# Patient Record
Sex: Male | Born: 1935 | Race: White | Hispanic: No | Smoking: Former smoker
Health system: Southern US, Community
[De-identification: ages and names within clinical notes are randomized; demographics above are authoritative.]

## PROBLEM LIST (undated history)

## (undated) DIAGNOSIS — Z7901 Long term (current) use of anticoagulants: Secondary | ICD-10-CM

## (undated) DIAGNOSIS — R0600 Dyspnea, unspecified: Secondary | ICD-10-CM

## (undated) DIAGNOSIS — I6529 Occlusion and stenosis of unspecified carotid artery: Secondary | ICD-10-CM

## (undated) DIAGNOSIS — G709 Myoneural disorder, unspecified: Secondary | ICD-10-CM

## (undated) DIAGNOSIS — N400 Enlarged prostate without lower urinary tract symptoms: Secondary | ICD-10-CM

## (undated) DIAGNOSIS — I639 Cerebral infarction, unspecified: Secondary | ICD-10-CM

## (undated) DIAGNOSIS — I480 Paroxysmal atrial fibrillation: Secondary | ICD-10-CM

## (undated) DIAGNOSIS — C4490 Unspecified malignant neoplasm of skin, unspecified: Secondary | ICD-10-CM

## (undated) DIAGNOSIS — T8859XA Other complications of anesthesia, initial encounter: Secondary | ICD-10-CM

## (undated) DIAGNOSIS — R011 Cardiac murmur, unspecified: Secondary | ICD-10-CM

## (undated) DIAGNOSIS — I10 Essential (primary) hypertension: Secondary | ICD-10-CM

## (undated) DIAGNOSIS — I2510 Atherosclerotic heart disease of native coronary artery without angina pectoris: Secondary | ICD-10-CM

## (undated) DIAGNOSIS — M1990 Unspecified osteoarthritis, unspecified site: Secondary | ICD-10-CM

## (undated) DIAGNOSIS — I4891 Unspecified atrial fibrillation: Secondary | ICD-10-CM

## (undated) DIAGNOSIS — I7121 Aneurysm of the ascending aorta, without rupture: Secondary | ICD-10-CM

## (undated) DIAGNOSIS — E7800 Pure hypercholesterolemia, unspecified: Secondary | ICD-10-CM

## (undated) DIAGNOSIS — T4145XA Adverse effect of unspecified anesthetic, initial encounter: Secondary | ICD-10-CM

## (undated) DIAGNOSIS — I499 Cardiac arrhythmia, unspecified: Secondary | ICD-10-CM

## (undated) DIAGNOSIS — G459 Transient cerebral ischemic attack, unspecified: Secondary | ICD-10-CM

## (undated) DIAGNOSIS — N200 Calculus of kidney: Secondary | ICD-10-CM

## (undated) DIAGNOSIS — I350 Nonrheumatic aortic (valve) stenosis: Secondary | ICD-10-CM

## (undated) DIAGNOSIS — I498 Other specified cardiac arrhythmias: Secondary | ICD-10-CM

## (undated) DIAGNOSIS — I712 Thoracic aortic aneurysm, without rupture: Secondary | ICD-10-CM

## (undated) DIAGNOSIS — C61 Malignant neoplasm of prostate: Secondary | ICD-10-CM

## (undated) HISTORY — DX: Cerebral infarction, unspecified: I63.9

## (undated) HISTORY — DX: Essential (primary) hypertension: I10

## (undated) HISTORY — DX: Other specified cardiac arrhythmias: I49.8

## (undated) HISTORY — DX: Pure hypercholesterolemia, unspecified: E78.00

## (undated) HISTORY — DX: Long term (current) use of anticoagulants: Z79.01

## (undated) HISTORY — DX: Occlusion and stenosis of unspecified carotid artery: I65.29

## (undated) HISTORY — DX: Nonrheumatic aortic (valve) stenosis: I35.0

## (undated) HISTORY — PX: COLONOSCOPY: SHX174

## (undated) HISTORY — DX: Calculus of kidney: N20.0

## (undated) HISTORY — DX: Malignant neoplasm of prostate: C61

## (undated) HISTORY — DX: Unspecified malignant neoplasm of skin, unspecified: C44.90

## (undated) HISTORY — DX: Benign prostatic hyperplasia without lower urinary tract symptoms: N40.0

## (undated) HISTORY — PX: OTHER SURGICAL HISTORY: SHX169

## (undated) HISTORY — DX: Unspecified atrial fibrillation: I48.91

## (undated) HISTORY — PX: JOINT REPLACEMENT: SHX530

## (undated) HISTORY — DX: Paroxysmal atrial fibrillation: I48.0

## (undated) HISTORY — PX: EYE SURGERY: SHX253

## (undated) HISTORY — PX: TOTAL HIP ARTHROPLASTY: SHX124

## (undated) HISTORY — DX: Transient cerebral ischemic attack, unspecified: G45.9

## (undated) HISTORY — DX: Atherosclerotic heart disease of native coronary artery without angina pectoris: I25.10

---

## 1994-08-02 HISTORY — PX: HERNIA REPAIR: SHX51

## 1997-08-02 HISTORY — PX: APPENDECTOMY: SHX54

## 1999-03-06 ENCOUNTER — Encounter: Payer: Self-pay | Admitting: Cardiology

## 2005-08-02 HISTORY — PX: CORONARY ARTERY BYPASS GRAFT: SHX141

## 2006-06-29 ENCOUNTER — Ambulatory Visit: Payer: Self-pay | Admitting: Cardiology

## 2006-07-04 ENCOUNTER — Inpatient Hospital Stay (HOSPITAL_BASED_OUTPATIENT_CLINIC_OR_DEPARTMENT_OTHER): Admission: RE | Admit: 2006-07-04 | Discharge: 2006-07-04 | Payer: Self-pay | Admitting: Cardiology

## 2006-07-04 ENCOUNTER — Ambulatory Visit (HOSPITAL_COMMUNITY): Admission: RE | Admit: 2006-07-04 | Discharge: 2006-07-04 | Payer: Self-pay | Admitting: Cardiovascular Disease

## 2006-07-04 ENCOUNTER — Ambulatory Visit: Payer: Self-pay | Admitting: Cardiovascular Disease

## 2006-07-13 ENCOUNTER — Ambulatory Visit: Payer: Self-pay | Admitting: Cardiology

## 2006-07-13 ENCOUNTER — Inpatient Hospital Stay (HOSPITAL_COMMUNITY)
Admission: RE | Admit: 2006-07-13 | Discharge: 2006-07-18 | Payer: Self-pay | Admitting: Thoracic Surgery (Cardiothoracic Vascular Surgery)

## 2006-08-12 ENCOUNTER — Ambulatory Visit (HOSPITAL_COMMUNITY): Admission: RE | Admit: 2006-08-12 | Discharge: 2006-08-12 | Payer: Self-pay | Admitting: Cardiology

## 2006-08-12 ENCOUNTER — Ambulatory Visit: Payer: Self-pay | Admitting: Cardiology

## 2007-03-10 ENCOUNTER — Ambulatory Visit: Payer: Self-pay | Admitting: Cardiology

## 2008-04-18 ENCOUNTER — Ambulatory Visit: Payer: Self-pay | Admitting: Cardiology

## 2008-04-18 LAB — CONVERTED CEMR LAB
ALT: 26 units/L (ref 0–53)
Albumin: 4.3 g/dL (ref 3.5–5.2)
Alkaline Phosphatase: 58 units/L (ref 39–117)
Cholesterol: 258 mg/dL (ref 0–200)
GFR calc Af Amer: 70 mL/min
HDL: 26.2 mg/dL — ABNORMAL LOW (ref >39.0)
Potassium: 4.8 meq/L (ref 3.5–5.1)
Sodium: 140 meq/L (ref 135–145)
Total Bilirubin: 1.3 mg/dL — ABNORMAL HIGH (ref 0.3–1.2)
Total CHOL/HDL Ratio: 9.8
Triglycerides: 191 mg/dL — ABNORMAL HIGH (ref 0–149)

## 2008-06-11 ENCOUNTER — Ambulatory Visit: Payer: Self-pay | Admitting: Cardiology

## 2008-06-11 LAB — CONVERTED CEMR LAB
ALT: 21 units/L (ref 0–53)
Albumin: 3.6 g/dL (ref 3.5–5.2)
HDL: 31 mg/dL — ABNORMAL LOW (ref >39.0)
LDL Cholesterol: 134 mg/dL — ABNORMAL HIGH (ref 0–99)
Total Bilirubin: 1.1 mg/dL (ref 0.3–1.2)
Total Protein: 6.7 g/dL (ref 6.0–8.3)
VLDL: 21 mg/dL (ref 0–40)

## 2008-08-02 DIAGNOSIS — Z87442 Personal history of urinary calculi: Secondary | ICD-10-CM

## 2008-08-02 HISTORY — DX: Personal history of urinary calculi: Z87.442

## 2009-02-17 ENCOUNTER — Encounter (INDEPENDENT_AMBULATORY_CARE_PROVIDER_SITE_OTHER): Payer: Self-pay | Admitting: *Deleted

## 2009-05-26 DIAGNOSIS — E7800 Pure hypercholesterolemia, unspecified: Secondary | ICD-10-CM | POA: Insufficient documentation

## 2009-05-26 DIAGNOSIS — I10 Essential (primary) hypertension: Secondary | ICD-10-CM | POA: Insufficient documentation

## 2009-05-26 DIAGNOSIS — E785 Hyperlipidemia, unspecified: Secondary | ICD-10-CM | POA: Insufficient documentation

## 2009-05-26 DIAGNOSIS — I2510 Atherosclerotic heart disease of native coronary artery without angina pectoris: Secondary | ICD-10-CM

## 2009-05-26 DIAGNOSIS — N200 Calculus of kidney: Secondary | ICD-10-CM

## 2009-05-29 ENCOUNTER — Ambulatory Visit: Payer: Self-pay | Admitting: Cardiology

## 2009-05-29 DIAGNOSIS — I498 Other specified cardiac arrhythmias: Secondary | ICD-10-CM | POA: Insufficient documentation

## 2009-05-30 LAB — CONVERTED CEMR LAB
Chloride: 104 meq/L (ref 96–112)
Cholesterol: 223 mg/dL — ABNORMAL HIGH (ref 0–200)
Direct LDL: 167.8 mg/dL
HDL: 29 mg/dL — ABNORMAL LOW (ref >39.00)
Potassium: 3.8 meq/L (ref 3.5–5.1)
Sodium: 136 meq/L (ref 135–145)
Triglycerides: 225 mg/dL — ABNORMAL HIGH (ref 0.0–149.0)

## 2009-07-17 ENCOUNTER — Encounter: Payer: Self-pay | Admitting: Cardiology

## 2010-06-11 ENCOUNTER — Encounter: Payer: Self-pay | Admitting: Cardiovascular Disease

## 2010-06-11 ENCOUNTER — Ambulatory Visit: Payer: Self-pay | Admitting: Cardiology

## 2010-09-01 NOTE — Assessment & Plan Note (Signed)
Summary: F1Y/DM  Medications Added VITAMIN D 2000 UNIT TABS (CHOLECALCIFEROL) one tab by mouth once daily L-LYSINE 1000 MG TABS (LYSINE) one tab by mouth once daily      Allergies Added: NKDA  History of Present Illness: [name] is a pleasant  gentleman who has a history of coronary artery disease.  He underwent coronary bypass graft in December 2007, with a LIMA to LAD, saphenous vein graft to first diagonal with sequential to the circumflex, saphenous graft to the PDA with sequential to a right posterolateral. I last saw him in Oct 2010. Since then he has dyspnea with more extreme activities but not with routine activities. There is no orthopnea, PND, pedal edema, palpitations or syncope. He has not had chest pain.  Current Medications (verified): 1)  Enalapril Maleate 10 Mg Tabs (Enalapril Maleate) .Marland Kitchen.. 1 Tab By Mouth Once Daily 2)  Aspirin 81 Mg Tbec (Aspirin) .... Take One Tablet By Mouth Daily 3)  Multivitamins   Tabs (Multiple Vitamin) .... Tab By Mouth Once Daily 4)  Vitamin D 2000 Unit Tabs (Cholecalciferol) .... One Tab By Mouth Once Daily 5)  L-Lysine 1000 Mg Tabs (Lysine) .... One Tab By Mouth Once Daily  Allergies (verified): No Known Drug Allergies  Past History:  Past Medical History: HYPERTENSION (ICD-401.9) HYPERLIPIDEMIA (ICD-272.4) CAD (ICD-414.00) NEPHROLITHIASIS (ICD-592.0) history of statin intolerance. History of bradycardia with beta blockade  Past Surgical History: Coronary bypass graft in December 2007, with a LIMA to LAD, saphenous vein graft to first diagonal with  sequential to the circumflex, saphenous graft to the PDA with sequential to a right posterolateral.  Social History: Reviewed history from 05/26/2009 and no changes required. The patient is married and lives with his wife in [place].  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  He has enjoyed a very active physical lifestyle all his life.  He has a  remote  history of tobacco use.  He denies significant alcohol consumption.  Review of Systems       no fevers or chills, productive cough, hemoptysis, dysphasia, odynophagia, melena, hematochezia, dysuria, hematuria, rash, seizure activity, orthopnea, PND, pedal edema, claudication. Remaining systems are negative.   Vital Signs:  Patient profile:   77 year old male Weight:      261 pounds Pulse rate:   53 / minute Pulse rhythm:   regular BP sitting:   140 / 62  (left arm) Cuff size:   large  Vitals Entered By: Deliah Goody, RN (June 11, 2010 8:12 AM)  Physical Exam  General:  Well-developed well-nourished in no acute distress.  Skin is warm and dry.  HEENT is normal.  Neck is supple. No thyromegaly.  Chest is clear to auscultation with normal expansion.  Cardiovascular exam is regular rate and rhythm.  Abdominal exam nontender or distended. No masses palpated. Extremities show no edema. neuro grossly intact    EKG  Procedure date:  06/11/2010  Findings:      Sinus bradycardia, first degree AV block, cannot rule out prior septal infarct, no ST changes.  Impression & Recommendations:  Problem # 1:  HYPERTENSION (ICD-401.9) Blood pressure borderline. However he checks it is routinely at home and his systolic is typically in the 120 range. Continue present medications. Potassium and renal function monitored by primary care. His updated medication list for this problem includes:    Enalapril Maleate 10 Mg Tabs (Enalapril maleate) .Marland Kitchen... 1 tab by mouth once daily    Aspirin 81  Mg Tbec (Aspirin) .Marland Kitchen... Take one tablet by mouth daily  Problem # 2:  HYPERLIPIDEMIA (ICD-272.4) Patient has not tolerated statins or Zetia. Continue diet. Lipids monitored by primary care. The following medications were removed from the medication list:    Zetia 10 Mg Tabs (Ezetimibe) .Marland Kitchen... Take one tablet by mouth daily.  The following medications were removed from the medication list:    Zetia 10  Mg Tabs (Ezetimibe) .Marland Kitchen... Take one tablet by mouth daily.  Problem # 3:  CAD (ICD-414.00) Continue aspirin and ACE inhibitor. Intolerant to statins. Plan Myoview he returns in one year. His updated medication list for this problem includes:    Enalapril Maleate 10 Mg Tabs (Enalapril maleate) .Marland Kitchen... 1 tab by mouth once daily    Aspirin 81 Mg Tbec (Aspirin) .Marland Kitchen... Take one tablet by mouth daily  Problem # 4:  BRADYCARDIA (ICD-427.89) No beta blocker given decreased heart rate and first-degree AV block. His updated medication list for this problem includes:    Enalapril Maleate 10 Mg Tabs (Enalapril maleate) .Marland Kitchen... 1 tab by mouth once daily    Aspirin 81 Mg Tbec (Aspirin) .Marland Kitchen... Take one tablet by mouth daily  Patient Instructions: 1)  Your physician recommends that you schedule a follow-up appointment in: ONE YEAR

## 2010-09-04 ENCOUNTER — Encounter: Payer: Self-pay | Admitting: Cardiology

## 2010-09-04 ENCOUNTER — Telehealth: Payer: Self-pay | Admitting: Cardiology

## 2010-09-07 ENCOUNTER — Ambulatory Visit (INDEPENDENT_AMBULATORY_CARE_PROVIDER_SITE_OTHER): Payer: PRIVATE HEALTH INSURANCE | Admitting: Cardiology

## 2010-09-07 ENCOUNTER — Telehealth (INDEPENDENT_AMBULATORY_CARE_PROVIDER_SITE_OTHER): Payer: Self-pay | Admitting: *Deleted

## 2010-09-07 ENCOUNTER — Encounter: Payer: Self-pay | Admitting: Cardiology

## 2010-09-07 DIAGNOSIS — G459 Transient cerebral ischemic attack, unspecified: Secondary | ICD-10-CM | POA: Insufficient documentation

## 2010-09-07 DIAGNOSIS — I2510 Atherosclerotic heart disease of native coronary artery without angina pectoris: Secondary | ICD-10-CM

## 2010-09-07 DIAGNOSIS — I10 Essential (primary) hypertension: Secondary | ICD-10-CM

## 2010-09-09 NOTE — Progress Notes (Signed)
Summary: Request call call about TIA   Phone Note Call from Patient Call back at Home Phone [phone]   Caller: Patient Summary of Call: Pt request  a call thinks he had a TIA request call asap want to talk to Dr.Crenshaw about having doppler study Initial call taken by: Judie Grieve,  September 04, 2010 1:41 PM  Follow-up for Phone Call        spoke with pt, he was seen recently and put on antibiotics for a prostate infection. he reports 2 days ago the corners of his mouth went numb, then yesterday his right arm and hand went numb. he went to the ER in [place] and was sent to [facility] for a MRI. he was told at [facility] he had a small TIA and to follow up with his medical md. he called here today to find out what he needed to do next. he is fatiqued today because he has not slept in 32 hours. he reports numbness in his fingers and corners of mouth at this time. will discuss with dr Jens Som.  Follow-up by: Deliah Goody, RN,  September 04, 2010 2:15 PM  Additional Follow-up for Phone Call Additional follow up Details #1::        discussed with dr Jens Som, pt will be seen monday. he will need referral to neuro, an echo and carotid. pt aware Deliah Goody, RN  September 04, 2010 3:25 PM\par

## 2010-09-10 ENCOUNTER — Encounter (INDEPENDENT_AMBULATORY_CARE_PROVIDER_SITE_OTHER): Payer: PRIVATE HEALTH INSURANCE

## 2010-09-10 DIAGNOSIS — I498 Other specified cardiac arrhythmias: Secondary | ICD-10-CM

## 2010-09-14 ENCOUNTER — Encounter: Payer: Self-pay | Admitting: Cardiology

## 2010-09-16 ENCOUNTER — Ambulatory Visit (HOSPITAL_COMMUNITY): Payer: Medicare Other | Attending: Internal Medicine

## 2010-09-16 ENCOUNTER — Encounter (INDEPENDENT_AMBULATORY_CARE_PROVIDER_SITE_OTHER): Payer: PRIVATE HEALTH INSURANCE

## 2010-09-16 ENCOUNTER — Encounter: Payer: Self-pay | Admitting: Cardiology

## 2010-09-16 DIAGNOSIS — I059 Rheumatic mitral valve disease, unspecified: Secondary | ICD-10-CM | POA: Insufficient documentation

## 2010-09-16 DIAGNOSIS — Z0000 Encounter for general adult medical examination without abnormal findings: Secondary | ICD-10-CM

## 2010-09-16 DIAGNOSIS — I079 Rheumatic tricuspid valve disease, unspecified: Secondary | ICD-10-CM | POA: Insufficient documentation

## 2010-09-16 DIAGNOSIS — I4891 Unspecified atrial fibrillation: Secondary | ICD-10-CM | POA: Insufficient documentation

## 2010-09-16 DIAGNOSIS — E785 Hyperlipidemia, unspecified: Secondary | ICD-10-CM | POA: Insufficient documentation

## 2010-09-16 DIAGNOSIS — G459 Transient cerebral ischemic attack, unspecified: Secondary | ICD-10-CM

## 2010-09-16 DIAGNOSIS — I2510 Atherosclerotic heart disease of native coronary artery without angina pectoris: Secondary | ICD-10-CM | POA: Insufficient documentation

## 2010-09-16 DIAGNOSIS — I10 Essential (primary) hypertension: Secondary | ICD-10-CM | POA: Insufficient documentation

## 2010-09-16 DIAGNOSIS — R011 Cardiac murmur, unspecified: Secondary | ICD-10-CM | POA: Insufficient documentation

## 2010-09-16 DIAGNOSIS — I6529 Occlusion and stenosis of unspecified carotid artery: Secondary | ICD-10-CM

## 2010-09-17 NOTE — Assessment & Plan Note (Signed)
Summary: f/u from TIA  Medications Added ASPIRIN EC 325 MG TBEC (ASPIRIN) Take one tablet by mouth daily      Allergies Added: ! FLAGYL  Visit Type:  Follow-up   History of Present Illness: [name] is a pleasant  gentleman who has a history of coronary artery disease.  He underwent coronary bypass graft in December 2007, with a LIMA to LAD, saphenous vein graft to first diagonal with sequential to the circumflex, saphenous graft to the PDA with sequential to a right posterolateral. I last saw him in Nov 2011. Since then the patient apparently was diagnosed with a TIA. He recently had his prostate checked and his PSA was elevated. He was placed on an antibiotic. Last Tuesday he developed numbness on the right side of his mouth. 2 days later he developed a numbness in team in sensation in his right upper extremity. There was no loss of strength. He denies any loss of strength or sensation in his other extremities, dysarthria or visual disturbance. He was taken to [facility] and apparently an MRI revealed a small CVA or TIA. He was released and now returns here. His numbness in his arm has resolved. He otherwise has not had dyspnea, chest pain, palpitations or syncope.  Current Medications (verified): 1)  Enalapril Maleate 10 Mg Tabs (Enalapril Maleate) .Marland Kitchen.. 1 Tab By Mouth Once Daily 2)  Aspirin 81 Mg Tbec (Aspirin) .... 2 Daily 3)  Multivitamins   Tabs (Multiple Vitamin) .... Tab By Mouth Once Daily 4)  Vitamin D 2000 Unit Tabs (Cholecalciferol) .... One Tab By Mouth Once Daily 5)  L-Lysine 1000 Mg Tabs (Lysine) .... One Tab By Mouth Once Daily  Allergies (verified): 1)  ! Flagyl  Past History:  Past Medical History: HYPERTENSION (ICD-401.9) HYPERLIPIDEMIA (ICD-272.4) CAD (ICD-414.00) NEPHROLITHIASIS (ICD-592.0) history of statin intolerance. History of bradycardia with beta blockade TIA  Past Surgical History: Reviewed history from 06/11/2010 and no changes required. Coronary  bypass graft in December 2007, with a LIMA to LAD, saphenous vein graft to first diagonal with  sequential to the circumflex, saphenous graft to the PDA with sequential to a right posterolateral.  Social History: Reviewed history from 06/11/2010 and no changes required. The patient is married and lives with his wife in [place].  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  He has enjoyed a very active physical lifestyle all his life.  He has a  remote history of tobacco use.  He denies significant alcohol consumption.  Review of Systems       no fevers or chills, productive cough, hemoptysis, dysphasia, odynophagia, melena, hematochezia, dysuria, hematuria, rash, seizure activity, orthopnea, PND, pedal edema, claudication. Remaining systems are negative.   Vital Signs:  Patient profile:   77 year old male Height:      76 inches Weight:      260.75 pounds BMI:     31.85 Pulse rate:   60 / minute BP sitting:   169 / 96  (left arm) Cuff size:   regular  Vitals Entered By: Caralee Ates CMA (September 07, 2010 11:00 AM)  Physical Exam  General:  Well-developed well-nourished in no acute distress.  Skin is warm and dry.  HEENT is normal.  Neck is supple. No thyromegaly.  Chest is clear to auscultation with normal expansion.  Cardiovascular exam is regular rate and rhythm. 2/6 systolic murmur left sternal border. Abdominal exam nontender or distended. No masses palpated. Extremities show no edema.  neuro grossly intact    EKG  Procedure date:  09/07/2010  Findings:      Sinus rhythm at a rate of 60. First degree AV block. Left axis deviation. RV conduction delay. Cannot rule out prior septal infarct.  Impression & Recommendations:  Problem # 1:  TIA (ICD-435.9) Pt recently diagnosed with TIA versus small CVA. We will obtain all records from [facility]. I will refer him to neurology here. Increase aspirin to 325 mg p.o. daily. Schedule carotid  Dopplers and echocardiogram. I will also schedule a 48 hour Holter monitor to screen for atrial fibrillation. He has no history of atrial arrhythmias.  Problem # 2:  HYPERTENSION (ICD-401.9) His blood pressure is elevated today but he checks this regularly at home and it is typically 120/76. Continue enalapril and increase as needed based on followup results. His updated medication list for this problem includes:    Enalapril Maleate 10 Mg Tabs (Enalapril maleate) .Marland Kitchen... 1 tab by mouth once daily    Aspirin Ec 325 Mg Tbec (Aspirin) .Marland Kitchen... Take one tablet by mouth daily  Problem # 3:  HYPERLIPIDEMIA (ICD-272.4) Intolerant to statins. Continue diet.  Problem # 4:  CAD (ICD-414.00) Continue aspirin and ACE inhibitor. Intolerant to statins. His updated medication list for this problem includes:    Enalapril Maleate 10 Mg Tabs (Enalapril maleate) .Marland Kitchen... 1 tab by mouth once daily    Aspirin Ec 325 Mg Tbec (Aspirin) .Marland Kitchen... Take one tablet by mouth daily  Other Orders: Carotid Duplex (Carotid Duplex) Echocardiogram (Echo) Holter (Holter) Neurology Referral (Neuro)  Patient Instructions: 1)  Your physician recommends that you schedule a follow-up appointment in: 6 WEEKS 2)  Your physician has recommended you make the following change in your medication: INCREASE ASPIRIN 325MG  ONCE DAILY 3)  You have been referred to NEUROLOGY FOR TIA 4)  Your physician has requested that you have a carotid duplex. This test is an ultrasound of the carotid arteries in your neck. It looks at blood flow through these arteries that supply the brain with blood. Allow one hour for this exam. There are no restrictions or special instructions. 5)  Your physician has requested that you have an echocardiogram.  Echocardiography is a painless test that uses sound waves to create images of your heart. It provides your doctor with information about the size and shape of your heart and how well your heart's chambers and valves are  working.  This procedure takes approximately one hour. There are no restrictions for this procedure. 6)  Your physician has recommended that you wear a holter monitor.  Holter monitors are medical devices that record the heart's electrical activity. Doctors most often use these monitors to diagnose arrhythmias. Arrhythmias are problems with the speed or rhythm of the heartbeat. The monitor is a small, portable device. You can wear one while you do your normal daily activities. This is usually used to diagnose what is causing palpitations/syncope (passing out).48 HOUR

## 2010-09-17 NOTE — Progress Notes (Signed)
   ROI Faxed to [facility] @ [phone] [name]  September 07, 2010 12:00 PM     Appended Document:  Records received from [facility] gave to [place]

## 2010-09-24 ENCOUNTER — Telehealth (INDEPENDENT_AMBULATORY_CARE_PROVIDER_SITE_OTHER): Payer: Self-pay | Admitting: *Deleted

## 2010-09-25 ENCOUNTER — Encounter: Payer: Self-pay | Admitting: Cardiology

## 2010-09-29 ENCOUNTER — Telehealth: Payer: Self-pay | Admitting: Cardiology

## 2010-09-29 ENCOUNTER — Encounter: Payer: Self-pay | Admitting: Cardiology

## 2010-09-29 NOTE — Progress Notes (Signed)
  Medications Added ASPIRIN 81 MG TBEC (ASPIRIN) Take one tablet by mouth daily WARFARIN SODIUM 5 MG TABS (WARFARIN SODIUM) Use as directed by Anticoagulation Clinic       Phone Note Outgoing Call   Call placed by: Deliah Goody, RN,  September 24, 2010 2:51 PM Summary of Call: monitor shows sinus with PVC's, PAC's and PAT. one brief run of atrial fib. per dr Jens Som pt to start coumadin 5mg  once daily. pt is leaving for [place] on saturday and an order was mailed to pt to have protime checked there and have the results sent to the coumadin clinic. pt will decrease his asa back to 81mg  daily. will make the coumadin clinic aware Deliah Goody, RN  September 24, 2010 3:00 PM     New/Updated Medications: ASPIRIN 81 MG TBEC (ASPIRIN) Take one tablet by mouth daily WARFARIN SODIUM 5 MG TABS (WARFARIN SODIUM) Use as directed by Anticoagulation Clinic Prescriptions: WARFARIN SODIUM 5 MG TABS (WARFARIN SODIUM) Use as directed by Anticoagulation Clinic  #30 x 12   Entered by:   Deliah Goody, RN   Authorized by:   Ferman Hamming, MD, [facility]   Signed by:   Deliah Goody, RN on 09/24/2010   Method used:   Electronically to        [facility] [address] (retail)       [address]       [place], [place]  [address]       Ph: [phone]       Fax: [phone]   RxID:   [id number]

## 2010-09-29 NOTE — Procedures (Signed)
Summary: summary report  summary report   Imported By: Mirna Mires 09/25/2010 10:58:05  _____________________________________________________________________  External Attachment:    Type:   Image     Comment:   External Document

## 2010-10-05 ENCOUNTER — Encounter: Payer: Self-pay | Admitting: Internal Medicine

## 2010-10-05 ENCOUNTER — Encounter (INDEPENDENT_AMBULATORY_CARE_PROVIDER_SITE_OTHER): Payer: PRIVATE HEALTH INSURANCE

## 2010-10-05 DIAGNOSIS — I4891 Unspecified atrial fibrillation: Secondary | ICD-10-CM

## 2010-10-05 DIAGNOSIS — Z7901 Long term (current) use of anticoagulants: Secondary | ICD-10-CM

## 2010-10-08 LAB — CONVERTED CEMR LAB

## 2010-10-08 NOTE — Procedures (Signed)
Summary: Summary Report  Summary Report   Imported By: Erle Crocker 09/30/2010 16:13:04  _____________________________________________________________________  External Attachment:    Type:   Image     Comment:   External Document

## 2010-10-08 NOTE — Progress Notes (Signed)
Summary: pt leaving phone # if needed to be rached   Phone Note Call from Patient   Caller: Patient [phone] Summary of Call: pt had blood work done today in [employer] wanted to leave a phone number where she can be reached if needed Initial call taken by: Glynda Jaeger,  September 29, 2010 11:41 AM  Follow-up for Phone Call        Phone # noted, attempted to call with INR results.  Follow-up by: Cloyde Reams RN,  September 29, 2010 1:56 PM

## 2010-10-12 ENCOUNTER — Encounter: Payer: Self-pay | Admitting: Cardiology

## 2010-10-12 ENCOUNTER — Encounter (INDEPENDENT_AMBULATORY_CARE_PROVIDER_SITE_OTHER): Payer: Medicare Other

## 2010-10-12 DIAGNOSIS — I4891 Unspecified atrial fibrillation: Secondary | ICD-10-CM

## 2010-10-12 DIAGNOSIS — Z7901 Long term (current) use of anticoagulants: Secondary | ICD-10-CM

## 2010-10-13 NOTE — Medication Information (Signed)
Summary: rov/ewj   Anticoagulant Therapy  Managed by: Bayard Hugger, PharmD Referring MD: Gearldine Bienenstock MD: Tenny Craw MD, Gunnar Fusi Indication 1: Atrial Fibrillation Lab Used: [facility] [place] Site: [address] INR POC 3.6 INR RANGE 2.0-3.0  Dietary changes: no    Health status changes: no    Bleeding/hemorrhagic complications: no    Recent/future hospitalizations: no    Any changes in medication regimen? no    Recent/future dental: no  Any missed doses?: no       Is patient compliant with meds? yes       Allergies: 1)  ! Flagyl  Anticoagulation Management History:      Positive risk factors for bleeding include an age of 18 years or older and history of CVA/TIA.  The bleeding index is 'intermediate risk'.  Positive CHADS2 values include History of HTN and Prior Stroke/CVA/TIA.  Negative CHADS2 values include Age > 37 years old.  Anticoagulation responsible provider: Tenny Craw MD, Gunnar Fusi.  INR POC: 3.6.  Cuvette Lot#: [id number].  Exp: 08/2011.    Anticoagulation Management Assessment/Plan:      The patient's current anticoagulation dose is Warfarin sodium 5 mg tabs: Use as directed by Anticoagulation Clinic.  The target INR is 2.0-3.0.  The next INR is due 10/12/2010.  Results were reviewed/authorized by Bayard Hugger, PharmD.         Prior Anticoagulation Instructions: INR 1.96  Attempted to call pt at # left, [phone].  LMOM TCB. Cloyde Reams RN  September 29, 2010 1:55 PM  Spoke with pt advised to continue on same dosage 5mg  daily, made OV to recheck on 10/05/10 in clinic.   Current Anticoagulation Instructions: INR 3.6 Take a 1/2 tablet (2.5mg ) Tue and Wed, then take 1 tablet (5mg ) from Thursday. Recheck INR on 3/12

## 2010-10-16 ENCOUNTER — Encounter: Payer: Self-pay | Admitting: Cardiology

## 2010-10-16 DIAGNOSIS — G459 Transient cerebral ischemic attack, unspecified: Secondary | ICD-10-CM

## 2010-10-19 ENCOUNTER — Encounter (INDEPENDENT_AMBULATORY_CARE_PROVIDER_SITE_OTHER): Payer: Medicare Other

## 2010-10-19 ENCOUNTER — Encounter: Payer: Self-pay | Admitting: Cardiology

## 2010-10-19 ENCOUNTER — Ambulatory Visit (INDEPENDENT_AMBULATORY_CARE_PROVIDER_SITE_OTHER): Payer: Medicare Other | Admitting: Cardiology

## 2010-10-19 DIAGNOSIS — Z7901 Long term (current) use of anticoagulants: Secondary | ICD-10-CM

## 2010-10-19 DIAGNOSIS — I4891 Unspecified atrial fibrillation: Secondary | ICD-10-CM

## 2010-10-19 DIAGNOSIS — I2510 Atherosclerotic heart disease of native coronary artery without angina pectoris: Secondary | ICD-10-CM

## 2010-10-19 DIAGNOSIS — I359 Nonrheumatic aortic valve disorder, unspecified: Secondary | ICD-10-CM | POA: Insufficient documentation

## 2010-10-19 DIAGNOSIS — I10 Essential (primary) hypertension: Secondary | ICD-10-CM

## 2010-10-19 LAB — CONVERTED CEMR LAB: POC INR: 3

## 2010-10-20 NOTE — Medication Information (Signed)
Summary: [name]   Anticoagulant Therapy  Managed by: Samantha Crimes, PharmD Referring MD: Gearldine Bienenstock MD: Jens Som MD, Arlys John Indication 1: Atrial Fibrillation Lab Used: [facility] [place] Site: [address] INR RANGE 2.0-3.0  Dietary changes: no    Health status changes: no    Bleeding/hemorrhagic complications: no    Recent/future hospitalizations: no    Any changes in medication regimen? no    Recent/future dental: no  Any missed doses?: no       Is patient compliant with meds? yes       Allergies: 1)  ! Flagyl  Anticoagulation Management History:      Positive risk factors for bleeding include an age of 75 years or older and history of CVA/TIA.  The bleeding index is 'intermediate risk'.  Positive CHADS2 values include History of HTN and Prior Stroke/CVA/TIA.  Negative CHADS2 values include Age > 30 years old.  Anticoagulation responsible provider: Jens Som MD, Arlys John.  Exp: 08/2011.    Anticoagulation Management Assessment/Plan:      The patient's current anticoagulation dose is Warfarin sodium 5 mg tabs: Use as directed by Anticoagulation Clinic.  The target INR is 2.0-3.0.  The next INR is due 10/12/2010.  Results were reviewed/authorized by Samantha Crimes, PharmD.         Prior Anticoagulation Instructions: INR 3.6 Take a 1/2 tablet (2.5mg ) Tue and Wed, then take 1 tablet (5mg ) from Thursday. Recheck INR on 3/12  Current Anticoagulation Instructions: Take 1/2 tab tomorrow then start new dosing regimen Return to clinic next monday after dr Jens Som appt

## 2010-10-26 ENCOUNTER — Ambulatory Visit (INDEPENDENT_AMBULATORY_CARE_PROVIDER_SITE_OTHER): Payer: Medicare Other | Admitting: *Deleted

## 2010-10-26 DIAGNOSIS — G459 Transient cerebral ischemic attack, unspecified: Secondary | ICD-10-CM

## 2010-10-26 NOTE — Patient Instructions (Signed)
INR 2.2 Continue taking 1 tablet (5mg ) daily, except take 1/2 tablet (2.5mg ) on Mondays, Wednesdays, and Fridays. Recheck in 4 weeks.

## 2010-10-29 NOTE — Medication Information (Signed)
Summary: rov/sp   Anticoagulant Therapy  Managed by: Windell Hummingbird, RN Referring MD: Gearldine Bienenstock MD: Jens Som MD, Arlys John Indication 1: Atrial Fibrillation Lab Used: [facility] [place] [facility] [address] Site: [address] INR POC 3.0 INR RANGE 2.0-3.0  Dietary changes: no    Health status changes: no    Bleeding/hemorrhagic complications: no    Recent/future hospitalizations: no    Any changes in medication regimen? no    Recent/future dental: no  Any missed doses?: no       Is patient compliant with meds? yes       Allergies: 1)  ! Flagyl  Anticoagulation Management History:      The patient is taking warfarin and comes in today for a routine follow up visit.  Positive risk factors for bleeding include an age of 75 years or older and history of CVA/TIA.  The bleeding index is 'intermediate risk'.  Positive CHADS2 values include History of HTN and Prior Stroke/CVA/TIA.  Negative CHADS2 values include Age > 84 years old.  Anticoagulation responsible provider: Jens Som MD, Arlys John.  INR POC: 3.0.  Cuvette Lot#: [id number].  Exp: 10/2011.    Anticoagulation Management Assessment/Plan:      The patient's current anticoagulation dose is Warfarin sodium 5 mg tabs: Use as directed by Anticoagulation Clinic.  The target INR is 2.0-3.0.  The next INR is due 10/26/2010.  Results were reviewed/authorized by Windell Hummingbird, RN.  He was notified by Windell Hummingbird, RN.         Prior Anticoagulation Instructions: Take 1/2 tab tomorrow then start new dosing regimen Return to clinic next monday after dr Jens Som appt  Current Anticoagulation Instructions: INR 3.0 Tomorrow (Tuesday)  take 1/2 tablet.  Continue taking 1 tablet every day, except take 1/2 tablet on Mondays, Wednesdays, and Fridays.  Recheck in 1 week.

## 2010-10-29 NOTE — Assessment & Plan Note (Signed)
Summary: 6 WEEK F/U SL/ NOTES ON FILE 09/11/10-MB  Medications Added * SAW PALMETTO two tablets by mouth once daily * METAMUCIL two tbsp by mouth once daily      Allergies Added:   History of Present Illness: [name] is a pleasant  gentleman who has a history of coronary artery disease.  He underwent coronary bypass graft in December 2007, with a LIMA to LAD, saphenous vein graft to first diagonal with sequential to the circumflex, saphenous graft to the PDA with sequential to a right posterolateral. I last saw him in February of 2012. He had been diagnosed with a TIA in [place]. We repeated an echocardiogram in February of 2012. This revealed normal LV function, biatrial enlargement, mild aortic stenosis with a mean gradient of 12 mm of mercury, mild aortic and mitral regurgitation. Carotid Dopplers revealed 0-39% stenosis bilaterally with followup recommended in 2 years. A Holter monitor showed brief atrial fibrillation. The patient was placed on Coumadin. Since then, the patient denies any dyspnea on exertion, orthopnea, PND, pedal edema, palpitations, syncope or chest pain.   Current Medications (verified): 1)  Enalapril Maleate 10 Mg Tabs (Enalapril Maleate) .Marland Kitchen.. 1 Tab By Mouth Once Daily 2)  Aspirin 81 Mg Tbec (Aspirin) .... Take One Tablet By Mouth Daily 3)  Multivitamins   Tabs (Multiple Vitamin) .... Tab By Mouth Once Daily 4)  Vitamin D 2000 Unit Tabs (Cholecalciferol) .... One Tab By Mouth Once Daily 5)  L-Lysine 1000 Mg Tabs (Lysine) .... One Tab By Mouth Once Daily 6)  Warfarin Sodium 5 Mg Tabs (Warfarin Sodium) .... Use As Directed By Anticoagulation Clinic 7)  Saw Palmetto .... Two Tablets By Mouth Once Daily 8)  Metamucil .... Two Tbsp By Mouth Once Daily  Allergies (verified): 1)  ! Flagyl  Past History:  Past Medical History: HYPERTENSION (ICD-401.9) HYPERLIPIDEMIA (ICD-272.4) CAD (ICD-414.00) NEPHROLITHIASIS (ICD-592.0) history of statin  intolerance. History of bradycardia with beta blockade TIA PAF Aortic stenosis  Past Surgical History: Reviewed history from 06/11/2010 and no changes required. Coronary bypass graft in December 2007, with a LIMA to LAD, saphenous vein graft to first diagonal with  sequential to the circumflex, saphenous graft to the PDA with sequential to a right posterolateral.  Social History: Reviewed history from 06/11/2010 and no changes required. The patient is married and lives with his wife in [place].  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  He has enjoyed a very active physical lifestyle all his life.  He has a  remote history of tobacco use.  He denies significant alcohol consumption.  Review of Systems       no fevers or chills, productive cough, hemoptysis, dysphasia, odynophagia, melena, hematochezia, dysuria, hematuria, rash, seizure activity, orthopnea, PND, pedal edema, claudication. Remaining systems are negative.   Vital Signs:  Patient profile:   77 year old male Weight:      262 pounds Pulse rate:   60 / minute Pulse rhythm:   regular BP sitting:   130 / 68  (left arm) Cuff size:   regular  Vitals Entered By: Deliah Goody, RN (October 19, 2010 7:59 AM)  Physical Exam  General:  Well-developed well-nourished in no acute distress.  Skin is warm and dry.  HEENT is normal.  Neck is supple. No thyromegaly.  Chest is clear to auscultation with normal expansion.  Cardiovascular exam is regular rate and rhythm. 2/6 systolic murmur left sternal border. Abdominal exam nontender or distended. No  masses palpated. Extremities show no edema. neuro grossly intact    Impression & Recommendations:  Problem # 1:  FIBRILLATION, ATRIAL (ICD-427.31) Patient has documented paroxysmal atrial fibrillation on holter monitor. Previous TIA. Continue Coumadin. I have reviewed previous electrocardiogram. His heart rate was 60 with a first degree AV block. I  will not add an AV nodal blocking agent at this point.  His updated medication list for this problem includes:    Aspirin 81 Mg Tbec (Aspirin) .Marland Kitchen... Take one tablet by mouth daily    Warfarin Sodium 5 Mg Tabs (Warfarin sodium) ..... Use as directed by anticoagulation clinic  Problem # 2:  AORTIC VALVE DISORDERS (ICD-424.1) Patient will need followup echocardiograms in the future. His updated medication list for this problem includes:    Enalapril Maleate 10 Mg Tabs (Enalapril maleate) .Marland Kitchen... 1 tab by mouth once daily  Problem # 3:  HYPERTENSION (ICD-401.9) Blood pressure controlled. Continue present medications. His updated medication list for this problem includes:    Enalapril Maleate 10 Mg Tabs (Enalapril maleate) .Marland Kitchen... 1 tab by mouth once daily    Aspirin 81 Mg Tbec (Aspirin) .Marland Kitchen... Take one tablet by mouth daily  Problem # 4:  HYPERLIPIDEMIA (ICD-272.4) Intolerant to statins. Continue diet.  Problem # 5:  CAD (ICD-414.00) Continue aspirin and ACE inhibitor. His updated medication list for this problem includes:    Enalapril Maleate 10 Mg Tabs (Enalapril maleate) .Marland Kitchen... 1 tab by mouth once daily    Aspirin 81 Mg Tbec (Aspirin) .Marland Kitchen... Take one tablet by mouth daily    Warfarin Sodium 5 Mg Tabs (Warfarin sodium) ..... Use as directed by anticoagulation clinic  Patient Instructions: 1)  Your physician wants you to follow-up in: 6 MONTHS  You will receive a reminder letter in the mail two months in advance. If you don't receive a letter, please call our office to schedule the follow-up appointment. 2)  PRAXADA OR XERALTO

## 2010-11-23 ENCOUNTER — Ambulatory Visit (INDEPENDENT_AMBULATORY_CARE_PROVIDER_SITE_OTHER): Payer: Medicare Other | Admitting: *Deleted

## 2010-11-23 DIAGNOSIS — G459 Transient cerebral ischemic attack, unspecified: Secondary | ICD-10-CM

## 2010-12-14 ENCOUNTER — Ambulatory Visit (INDEPENDENT_AMBULATORY_CARE_PROVIDER_SITE_OTHER): Payer: Medicare Other | Admitting: *Deleted

## 2010-12-14 DIAGNOSIS — Z7901 Long term (current) use of anticoagulants: Secondary | ICD-10-CM | POA: Insufficient documentation

## 2010-12-14 DIAGNOSIS — G459 Transient cerebral ischemic attack, unspecified: Secondary | ICD-10-CM

## 2010-12-14 LAB — POCT INR: INR: 2.3

## 2010-12-15 NOTE — Assessment & Plan Note (Signed)
[facility]                            CARDIOLOGY OFFICE NOTE   [name], [name]                     MRN:          [id number]  DATE:04/18/2008                            DOB:          06-16-1934    [name] is a pleasant 77 year old gentleman who has a history of  coronary artery disease.  He underwent coronary bypass graft in December  2007, with a LIMA to LAD, saphenous vein graft to first diagonal with  sequential to the circumflex, saphenous graft to the PDA with sequential  to a right posterolateral.  Since I last saw him, he is doing well.  He  denies any dyspnea, chest pain, palpitations, or syncope.  There is no  pedal edema.  He did not tolerate a Pravachol due to myalgias.   MEDICATIONS:  1. Aspirin 81 mg p.o. daily.  2. Enalapril 10 mg p.o. daily.  3. Multivitamin daily.   PHYSICAL EXAMINATION:  VITAL SIGNS:  Blood pressure 152/86 and his pulse  is 45.  Weight is 260 pounds.  HEENT:  Normal.  NECK:  Supple with no bruits.  CHEST:  Clear.  CARDIOVASCULAR:  Bradycardic rate, but a regular rhythm.  ABDOMEN:  No tenderness.  EXTREMITIES:  No edema.   His electrocardiogram shows a marked sinus bradycardia rate of 47.  There is left axis deviation.  There is a first-degree AV block.  There  is a prior septal infarct.   DIAGNOSES:  1. Coronary artery disease, status post coronary artery bypass graft -      [name] is doing well with no chest pain or shortness of breath.      We will continue with medical therapy to include his aspirin and      ACE inhibitor.  He is not on a beta-blocker due to his bradycardia      and he has not tolerated statins secondary to myalgias.  2. Hyperlipidemia - I will check lipids and we will add Zetia if his      LDL is greater than 70.  He has not tolerated statins in the past.  3. Hypertension - his blood pressure mildly elevated.  However, he      states it runs in the 130/60 range at home.  We  will not make any      changes, but I will check a basic metabolic panel.  4. History of allergies to Toprol.  5. Intolerance to statins.  6. History of nephrolithiasis.   We will see him back in 12 months.     Madolyn Frieze Jens Som, MD, [facility]  Electronically Signed    BSC/MedQ  DD: 04/18/2008  DT: 04/19/2008  Job #: [id number]

## 2010-12-15 NOTE — Assessment & Plan Note (Signed)
[facility] OFFICE NOTE   NAYEL, PURDY                     MRN:          [id number]  DATE:03/10/2007                            DOB:          [birth year]    Mr. Blais is a pleasant 77 year old male who has a history of coronary  disease.  The patient underwent coronary artery bypass and graft in  December of 2007.  At that time, he had a LIMA to the LAD, saphenous  vein graft to the 1st diagonal with sequential saphenous vein graft to  the circumflex, saphenous vein graft to the PDA with sequential to the  right posterolateral.  Note, he did have bradycardia postoperatively on  beta blockers.  There is transient 2nd and 3rd degree heart block.  Since I last saw him he is doing well.  He denies any dyspnea on  exertion, orthopnea, PND, pedal edema, palpitations, presyncope,  syncope, or exertional chest pain.  He discontinued his Zocor due to  arthralgias.   MEDICATIONS:  1. Aspirin 81 mg p.o. daily.  2. Vasotek 10 mg p.o. daily.  3. Co-enzyme Q.  4. Aloe vera.  5. Flomax.   PHYSICAL EXAMINATION:  Blood pressure of 128/78, and his pulse if 58.  He weighs 255 pounds.  His HEENT is normal.  His neck is supple.  His chest is clear.  His cardiovascular exam is a regular rate and rhythm.  There is a 1-2/6  systolic ejection murmur.  ABDOMINAL EXAM:  Benign.  EXTREMITIES:  Showed no edema.  Note, his sternotomy is without evidence of infection.   DIAGNOSES:  1. Coronary artery disease, status post coronary artery bypass and      graft:  The patient has had no chest pain or shortness of breath.      We will continue with medical therapy including his aspirin and      angiotensin-converting enzyme inhibitor.  Note, he has had an      allergy and bradycardia/heart block with TOPROL in the past.  2. Hyperlipidemia:  The patient discontinued his Zocor due to      myalgias.  He also did not tolerate  Crestor.  I will try Pravachol      20 mg p.o. nightly to see if he will tolerate.  If so, we will      check lipids and liver in 6 weeks.  3. Hypertension:  His blood pressure is well controlled on his present      medications.  4. Allergy to TOPROL.  5. History of nephrolithiasis.   He will continue with risk factor modification.  We discussed diet and  exercise.  Note, he does not smoke.  I will see him back in 12 months.     Madolyn Frieze Jens Som, MD, [facility]  Electronically Signed    BSC/MedQ  DD: 03/10/2007  DT: 03/10/2007  Job #: [id number]

## 2010-12-18 NOTE — Op Note (Signed)
[name], [name]              ACCOUNT NO.:  [id number]   MEDICAL RECORD NO.:  [id number]          PATIENT TYPE:  INP   LOCATION:  2316                         FACILITY:  [facility]   PHYSICIAN:  Salvatore Decent. Cornelius Moras, M.D. DATE OF BIRTH:  1934-01-26   DATE OF PROCEDURE:  07/13/2006  DATE OF DISCHARGE:                               OPERATIVE REPORT   PREOPERATIVE DIAGNOSIS:  Severe three vessel coronary artery disease.   POSTOPERATIVE DIAGNOSIS:  Severe three vessel coronary artery disease.   PROCEDURE:  Median sternotomy for coronary artery bypass grafting x6  (left internal mammary artery to distal left anterior descending  coronary artery, saphenous vein graft to first diagonal branch with  sequential saphenous vein graft to circumflex marginal branch, saphenous  vein graft to posterior descending coronary artery with sequential  saphenous vein graft to right posterolateral branch, endoscopic  saphenous vein harvest from bilateral thigh and right lower leg).   SURGEON:  Salvatore Decent. Cornelius Moras, M.D.   ASSISTANT:  Sheliah Plane, MD   SECOND ASSISTANT:  Jerold Coombe, P.A.-C.   ANESTHESIA:  General.   BRIEF CLINICAL NOTE:  The patient is a 77 year old male with no previous  history of coronary artery disease but risk factors notable for history  of hypertension and hyperlipidemia.  The patient presents with a two  month history of progressive symptoms of exertional angina.  He  underwent cardiac catheterization demonstrating severe three vessel  coronary artery disease with normal left ventricular function.  A full  consultation note has been dictated previously.  The patient has been  counseled at length regarding the indications, risks, and potential  benefits of surgery.  Alternative treatment strategies have been  discussed.  He understands and accepts all associated risks of surgery  and desires to proceed as described.   OPERATIVE FINDINGS:  1. Normal left ventricular  systolic function.  2. Good quality left internal mammary artery conduit for grafting.  3. Fair quality saphenous vein conduit for grafting.  4. Diffuse coronary artery disease with fair to poor quality targets      for grafting.   OPERATIVE NOTE IN DETAIL:  The patient is brought to the operating room  on the above mentioned date and central monitoring was established by  the anesthesia service under the care and direction of Dr. Arta Bruce.  Specifically, a Swan-Ganz catheter is placed through the right internal  jugular approach.  A radial arterial line is placed.  Intravenous  antibiotics were administered.  Following induction with general  endotracheal anesthesia, a Foley catheter is placed.  The patient's  chest, abdomen, both groins, and both lower extremities were prepared  and draped in a sterile manner.   A median sternotomy incision was performed and the left internal mammary  artery was dissected from the chest wall and prepared for bypass  grafting.  The left internal mammary artery is good quality conduit.  Simultaneously, saphenous vein was obtained from the patient's right  thigh and the upper portion of the right lower leg using endoscopic vein  harvest technique.  Portions of the saphenous vein are small and, as  such, an additional segment of saphenous vein was obtained from the  patient's left thigh using endoscopic vein harvest technique.  Ultimately, the saphenous vein which is used for grafting is felt to be  a satisfactory conduit.  After the saphenous vein is removed, the small  surgical incisions in both lower extremities are closed in multiple  layers with running absorbable suture.  The patient is heparinized  systemically and the left internal mammary artery is transected  distally.  It is noted to have excellent flow.   The pericardium was opened.  The ascending aorta is normal in  appearance.  The ascending aorta and the right atrium were cannulated   for cardiopulmonary bypass.  Adequate heparinization is verified.  Cardiopulmonary bypass is begun and the surface of the heart was  inspected.  Distal sites are selected for coronary bypass grafting.  There is diffuse coronary artery disease with hard calcified plaque  throughout most of the epicardial coronary arteries.  A temperature  probe is placed in the left ventricular septum.  A cardioplegic catheter  is placed in the ascending aorta.   The patient is allowed to cool passively to 32 degrees systemic  temperature.  The aortic crossclamp was applied and cold blood  cardioplegia is administered in an antegrade fashion through the aortic  root.  Iced saline slush was applied for topical hypothermia.  The  initial cardioplegic arrest and myocardial cooling are felt to be  excellent.  Repeat doses of cardioplegia are administered intermittently  throughout the crossclamp portion of the operation through the aortic  root and down subsequently placed vein grafts to maintain left  ventricular septal temperature below 15 degrees centigrade.   The following distal coronary anastomoses were performed:  1. The posterior descending coronary artery is grafted with a      saphenous vein graft in a side-to-side fashion.  This vessel was      diffusely diseased.  It measures 1.3 mm at the site of distal      bypass and is a fair to poor quality target.  2. The posterolateral branch off the distal right coronary artery is      grafted using a sequential saphenous vein graft off of the vein      placed to the posterior descending coronary artery.  This vessel      measures 1.5 mm in diameter and is a fair to good quality target      vessel for grafting.  3. The large first diagonal branch off the left anterior descending      coronary artery is grafted with a saphenous vein graft in a side-to-      side fashion.  This vessel is diffusely diseased.  It measures 1.8     mm at the site of distal  bypass and is a fair quality target      vessel.  4. The circumflex marginal branch is grafted with sequential saphenous      vein graft using the vein placed to the first diagonal branch.      This vessel measures 1.2 mm in diameter at the site of distal      bypass and is a fair target vessel for grafting.  5. The distal left anterior descending coronary artery is grafted with      the left internal mammary artery in an end-to-side fashion.  This      vessel is diffusely diseased throughout and the site for distal  anastomoses is difficult to identify.  Ultimately, the graft is      placed quite far distally towards the apex of the heart.  At the      site of distal grafting, it is diffusely diseased but a 1.5-mm      probe will pass in both directions.   Both proximal saphenous vein anastomoses were performed directly to the  ascending aorta prior to removal of the aortic crossclamp. The aortic  crossclamp was removed after a total crossclamp time of 93 minutes.  The  left ventricular septal temperature was noted to rise rapidly prior to  removal of the crossclamp after reperfusion of the left internal mammary  artery.  The heart begins to beat spontaneously without need for  cardioversion.  All proximal and distal coronary anastomoses were  inspected for hemostasis and appropriate graft orientation.  Epicardial  pacing wires were affixed to the right ventricular outflow tract and to  the right atrial appendage.  The patient is rewarmed to 37 degrees  centigrade temperature.  The patient is weaned from cardiopulmonary  bypass without difficulty.  The patient's rhythm at separation from  bypass is third degree AV block.  AV sequential pacing was employed.  Total cardiopulmonary bypass time for the operation was 109 minutes.  Follow up transesophageal echocardiogram was performed by Dr. Michelle Piper  after separation from bypass demonstrates normal left ventricular  function.   The  venous and arterial cannulae are removed uneventfully.  Protamine is  administered to reverse the anticoagulation.  The mediastinum and the  left chest are irrigated with saline solution containing vancomycin.  Meticulous surgical hemostasis is ascertained.  The mediastinum and the  left chest are drained using three chest tubes exited through separate  stab incisions inferiorly.  The soft tissues anterior to the aorta are  reapproximated loosely.  The On-Q continuous pain management system is  utilized to facilitate postoperative pain control.  Two 10-inch Silastic  catheters supplied with the On-Q kit are tunneled into the deep  subcutaneous tissues and positioned just lateral to the lateral border  of the sternum on either side.  Each catheter was flushed with 5 mL of  0.5% bupivacaine solution and ultimately they are connected to a  continuous infusion pump.  The sternum was closed with double strength sternal wire.  The soft tissues anterior to the sternum are closed in  multiple layers and the skin is closed with a running subcuticular skin  closure.   The patient tolerated the procedure well and is transported to the  surgical intensive care unit in stable condition.  There are no  interoperative complications.  All sponge, instrument and needle counts  were verified correct at completion of the operation.  No blood products  were administered.      Salvatore Decent. Cornelius Moras, M.D.  Electronically Signed     CHO/MEDQ  D:  07/13/2006  T:  07/13/2006  Job:  [id number]   cc:   Madolyn Frieze. Jens Som, MD, [facility]  Veverly Fells. Excell Seltzer, MD  Wallace Cullens

## 2010-12-18 NOTE — Discharge Summary (Signed)
[name], [name]              ACCOUNT NO.:  [id number]   MEDICAL RECORD NO.:  [id number]          [name] TYPE:  INP   LOCATION:  2030                         FACILITY:  [facility]   PHYSICIAN:  Salvatore Decent. Cornelius Moras, M.D. DATE OF BIRTH:  06/19/1934   DATE OF ADMISSION:  07/13/2006  DATE OF DISCHARGE:  07/18/2006                               DISCHARGE SUMMARY   ADMISSION DIAGNOSIS:  Severe 3-vessel coronary artery disease.   DISCHARGE/SECONDARY DIAGNOSES:  1. Severe 3-vessel coronary artery disease, status post coronary      artery bypass grafting.  2. Postoperative transient bradycardia with second-degree versus third-      degree heart block, resolved off beta blockers.  3. Postoperative hyperglycemia, improved.  4. Hypertension.  5. Hypercholesterolemia.  6. Mild postoperative acute blood loss anemia.  7. Left leg incisional drainage without signs of infection.  8. History of nephrolithiasis.   SURGICAL HISTORY:  History of appendectomy in 1999.   ALLERGIES:  FLAGYL, NOVOCAIN, DEVELOPS A RASH WITH TOPROL, AND HAD  POSTOPERATIVE SECOND OR THIRD-DEGREE HEART BLOCK WITH BETA BLOCKER  THERAPY.   PROCEDURES:  July 13, 2006:  Coronary artery bypass grafting x6  using left internal mammary artery to the distal LAD, saphenous vein  graft to the first diagonal branch with sequential saphenous vein graft  to the circumflex marginal branch, saphenous vein graft to the posterior  descending coronary artery with sequential saphenous vein graft to the  right posterolateral branch, endoscopic saphenous vein harvest for  bilateral thighs and right lower leg, surgeon Dr. Tressie Stalker.   BRIEF HISTORY:  [name] is a 77 year old Caucasian male from [facility] with no previous cardiac history but risk factors notable for  hypertension, hyperlipidemia.  In early December, he reported a 51-month  history of progressive symptoms of exertional chest tightness consistent  with angina  pectoris.  This chest pain occurred with exertion and was  promptly relieved with rest.  There was associated shortness of breath.  The frequency and severity of his symptoms gradually increased over the  last 2 months, ultimately prompting him to present for evaluation.  He  was referred to Dr. Olga Millers who saw him in consultation November  28.  He was started on low-dose beta blocker at the time and was  scheduled for elective cardiac catheterization.  This was performed on  July 04, 2006.  Notable for severe three-vessel coronary artery  disease with normal left ventricular function.  On day of  catheterization, a cardiac surgery consultation was requested for  consideration of surgical revascularization.  [name] was seen by Dr.  Tressie Stalker.  He agreed that his coronary artery was irreparable for  percutaneous coronary intervention; therefore, coronary artery bypass  graft surgery was recommended.  After discussing risk and benefits,  [name] agreed to proceed.  [name] was discharged home following his  heart catheterization with instructions to return to the emergency room  for chest pain, and if no further chest pain, plans to proceed with CABG  on July 13, 2006.   HOSPITAL COURSE:  [name] was electively admitted to [facility] as anticipated on July 13, 2006.  He underwent coronary  artery bypass graft surgery.  Postoperatively, he was transferred to the  surgical intensive care unit.  He did require some atrial pacing  initially and low-dose Neo-Synephrine drip.  However, on postoperative  day 1, he was extubated, neurologically intact.  His Neo-Synephrine drip  was weaned over the next 24 hours.  He was monitored for mild acute  blood loss anemia as well as on volume excess.  Postoperatively, he was  started on beta blocker therapy with atenolol as he developed a drug  rash previously with Toprol; however, he was noted to have two episodes   of transient failure with external pacer to capture with what looked  like to be second-degree or third-degree heart block.  These were  associated with transient nausea and loss of consciousness which  resolved promptly and he returned to sinus bradycardia.  Subsequently,  his beta blocker therapy was held.  He remained in the ICU for further  monitoring.  His baseline EKG showed sinus bradycardia with right bundle  branch block.  He remained on AAMI pacing.  The morning of postoperative  day 2, [name] was felt appropriate to transfer out of the surgical  intensive care unit as his rhythm had remained stable on external pacer.  He remained in unit 2000 until discharge.  There were no further  episodes of heart block.  On the morning of postoperative day 4, his  external pacer was continued as it appeared his heart rate was  overriding the external pacemaker.  He remained off beta blocker  therapy.  ACE inhibitor therapy was not started postoperatively due to  borderline low blood pressure with the systolic in the low 100s.  By  postoperative day 5, [name] was felt appropriate for discharge home.  The external pacemaker was discontinued as his rhythm had been stable  greater than 24 hours off the external pacemaker.  His rate was in the  70s and 90s.  He was saturating 95% on room air.  He was overall  afebrile with intermittent low-grade temperatures which were felt  secondary to atelectasis as there was no obvious source of infection,  but he did have some left leg drainage from his venectomy site, but  there was no associated infection, and he was instructed on local wound  care with daily gauze dressing changes.  He had been tolerating a  regular diet, bowel and bladder had been much improved.  He had been  ambulating the hallways with Cardiac Rehab and was making good progress.  Per cardiac surgery protocol, his blood sugars were monitored regularly and for a short term he was  covered with sliding scale insulin; however,  he did have a normal hemoglobin Alc of 5.7 and sugars were between 63  and 123, discharged off of insulin.  His latest chest x-ray on December  15 was stable showing slight improved aeration but persistent bibasilar  atelectasis and small adhesions.  His discharge labs were also stable  showing sodium 138, potassium 3.9, chloride 107, CO2 of 29, blood  glucose of 115, BUN 17, creatinine 1.1, calcium 8.2, white blood count  10.7, hemoglobin 8.9, hematocrit 25.7, platelet count 157.  Preoperatively, the liver function tests showed total bilirubin of 1,  alkaline phosphatase 53, AST 31, ALT 29, total protein 6.4, and BMET  4.1.   DISPOSITION:  [name] is discharged home in stable condition on  July 18, 2006,  postoperative day 5.   DISCHARGE MEDICATIONS:  1. Coated aspirin 325 mg p.o. daily.  2. Crestor 10 mg p.o. daily.  3. Multivitamin p.o. daily.  4. Tylox one to two tablets p.o. q.4 h. p.r.n. pain.   DISCHARGE INSTRUCTIONS:  He is instructed to avoid driving or heavy  lifting more than 10 pounds.  He is encouraged to continue daily walking  and breathing exercises.  He is to follow a low-salt/low-fat diet.  He  may shower and clean his incisions with soap and water.  He should  notify the [facility] office if he develops fever greater than 101 or redness  or purulent drainage from incision sites.   FOLLOWUP:  He is to follow up with Dr. Cornelius Moras at [facility] office in 3 weeks.  The office will contact him regarding specific date and time.  He should  also call and scheduled a 2-week followup with Dr. Olga Millers with a  chest x-ray at this appointment.      Jerold Coombe, P.A.      Salvatore Decent. Cornelius Moras, M.D.  Electronically Signed    AWZ/MEDQ  D:  08/16/2006  T:  08/17/2006  Job:  [id number]   cc:   Salvatore Decent. Cornelius Moras, M.D.  Madolyn Frieze Jens Som, MD, [facility]  Mark Narda Amber. Excell Seltzer, MD

## 2010-12-18 NOTE — Cardiovascular Report (Signed)
[name], [name]              ACCOUNT NO.:  [id number]   MEDICAL RECORD NO.:  [id number]          PATIENT TYPE:  OIB   LOCATION:  1961                         FACILITY:  [facility]   PHYSICIAN:  Veverly Fells. Excell Seltzer, MD  DATE OF BIRTH:  September 23, 1933   DATE OF PROCEDURE:  07/04/2006  DATE OF DISCHARGE:  07/04/2006                            CARDIAC CATHETERIZATION   PROCEDURES:  Left heart catheterization, selective coronary angiography,  left ventricular angiography.   INDICATIONS:  [name] is a very nice 77 year old man who presents  with classic stable angina of recent onset.  He underwent a stress test  that demonstrated significant ST changes as well as his onset of his  typical chest pain.  There was also a reported perfusion defect.  He was  subsequently referred for cardiac catheterization.   ACCESS:  Right femoral artery 4-French.   COMPLICATIONS:  None.   PROCEDURAL DETAILS:  Risks and indications of the procedure were  explained in detail to the patient.  Informed consent was obtained.  The  right groin was prepped, draped, anesthetized with a 1% lidocaine.  Using modified Seldinger technique, a 4-French arterial sheath was  placed in the right femoral artery.  Multiple views of the left and  right coronary arteries were taken.  For the left coronary artery, a 4-  [place] JL-5 catheter was used.  For the right coronary artery, a 4-  [place] JL-4 catheter was used.  Following selective coronary  angiography, an angled pigtail catheter was inserted in the left  ventricle and ventricular pressures were recorded.  An RAO right  ventriculogram was done.  A pullback across the aortic valve was  performed.  All catheter exchanges were performed over a guidewire.  At  the conclusion of the case, the sheath was pulled and manual pressure  used for hemostasis.   FINDINGS:  Aortic pressure 126/60 with a mean of 85.  Left ventricular  pressure 126/0 with an end-diastolic pressure of  8.   CORONARY ANGIOGRAPHY:  The left main stem has mild nonobstructive  plaque.  It is mildly calcified.  The left main bifurcates into the LAD  and left circumflex.   The LAD is a large-caliber vessel that courses down to the left  ventricular apex.  The LAD is heavily calcified throughout its proximal  and midportions.  There is serial lesions in the proximal and mid-LAD of  70% and 80%.  The most significant obstructive lesion is just after the  first septal perforator and it is in a heavily calcified region of the  LAD.  The mid-LAD beyond the second diagonal has a tubular area of 60%  stenosis.  The distal LAD has nonobstructive disease.  There was a very  large first diagonal branch that appears to have a 50% ostial stenosis.  The second diagonal branch has minimal disease.   Left circumflex system has an ostial 75% lesion.  It then courses down  in an obtuse marginal territory and branches into twin vessels.   The right coronary artery is heavily calcified.  There are  nonobstructive lesions of approximately 30% stenosis  in the mid and  distal vessel.  There is a small right PDA present.  The posterior AV  segment has a 50% stenosis and it gives off a large posterolateral  branch that has nonobstructive disease.  There is a large AV nodal  artery off of the posterior AV segment.   Left ventricular function is normal as assessed by left  ventriculography.  The LVEF is 60%.  There is no significant MR.   ASSESSMENT:  1. Three-vessel coronary artery disease.      a.     Severe calcified serial lesions in the proximal and mid-left       anterior descending artery.      b.     Moderate to high-grade ostial left circumflex disease.      c.     Mild to moderate right coronary artery disease in the       posterior atrioventricular segment.  2. Normal left ventricular function.   RECOMMENDATIONS:  I reviewed the situation in detail with [name] and  his wife.  Also discussed  his case with Dr. Jens Som.  I think with his  long segment of calcified disease throughout his LAD and multiple  lesions in the LAD as well as an ostial left circumflex lesion, it would  not be approachable with stenting.  He would be better served with  complete revascularization by coronary bypass surgery.  He is a very  active gentleman and wants to lead an active lifestyle.  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  We have arranged for  him to see Dr. Tressie Stalker in outpatient consultation.  Appreciate his  consult in advance.  The patient was given a prescription for sublingual  nitroglycerin to be used on a p.r.n. basis.  He will continue with  medical therapy for his coronary artery disease in the interim.      Veverly Fells. Excell Seltzer, MD  Electronically Signed     MDC/MEDQ  D:  07/04/2006  T:  07/04/2006  Job:  [id number]   cc:   Salvatore Decent. Cornelius Moras, M.D.  Madolyn Frieze Jens Som, MD, [facility]

## 2010-12-18 NOTE — H&P (Signed)
NAMECHRISTOHER, DRUDGE              ACCOUNT NO.:  [id number]   MEDICAL RECORD NO.:  [id number]          PATIENT TYPE:  INP   LOCATION:  NA                           FACILITY:  [facility]   PHYSICIAN:  Salvatore Decent. Cornelius Moras, M.D. DATE OF BIRTH:  [birth year]   DATE OF ADMISSION:  07/04/2006  DATE OF DISCHARGE:  07/04/2006                              HISTORY & PHYSICAL   REQUESTING PHYSICIAN:  Veverly Fells. Excell Seltzer, M.D.   PRIMARY CARDIOLOGIST:  Madolyn Frieze. Jens Som, MD, [facility].   PRIMARY CARE PHYSICIAN:  Dr. Wallace Cullens   Date of planned hospital admission:  July 13, 2006.   PRESENTING CHIEF COMPLAINT:  Exertional chest discomfort.   HISTORY OF PRESENT ILLNESS:  Mr. Klemp is a 77 year old gentleman from  [facility] with no previous cardiac history but risk factors notable for  a history of hypertension and hyperlipidemia.  The patient reports a 2-  month history of progressive symptoms of exertional chest tightness  consistent with angina pectoris.  The patient states that his chest pain  always occurs with exertion and is usually promptly relieved by rest.  The pain is associated with some shortness of breath.  It is not  associated with nausea, vomiting or diaphoresis.  The frequency and  severity of the patient's symptoms have gradually increased over the  last 2 months, ultimately prompting him to present for evaluation.  He  was referred to Dr. Olga Millers who saw him in consultation on  November 28th.  He was started on a low-dose beta blocker at that time  and scheduled for elective cardiac catheterization.  This was performed  earlier today by Dr. Excell Seltzer and findings at the time of catheterization  were notable first for severe three-vessel coronary artery disease with  normal left ventricular function.  Mr. Trevino has been referred for  surgical revascularization.   REVIEW OF SYSTEMS:  GENERAL:  The patient reports normal appetite.  He  has not been gaining or losing weight  recently.  He is 6 feet 4 inches  tall and weighs 265 pounds.  CARDIAC:  The patient describes a classical  history of progressive exertional angina.  He denies any episodes of  chest pain occurring at rest or with minimal activity.  He denies  nocturnal angina.  He denies resting shortness of breath.  He does have  shortness of breath with relatively mild activity.  He denies PND,  orthopnea, or lower extremity edema.  He has not had syncopal episodes  nor palpitations.  RESPIRATORY:  Notable for intermittent dry cough.  He  denies productive cough, hemoptysis, wheezing.  GASTROINTESTINAL:  Notable only for some occasional mild constipation.  The patient has no  difficulty swallowing.  He denies hematochezia, hematemesis, melena.  GENITOURINARY:  Negative.  PERIPHERAL VASCULAR:  Negative.  NEUROLOGIC:  Negative.  The patient denies symptoms suggestive of previous TIA or  stroke.  MUSCULOSKELETAL:  Negative.  PSYCHIATRIC:  Negative.  HEENT:  Negative.  The patient is edentulous and wears full set, upper and lower  dentures.   PAST MEDICAL HISTORY:  1. Hypertension.  2. Hypercholesterolemia  3.  Nephrolithiasis   PAST SURGICAL HISTORY:  Appendectomy 1999.   FAMILY HISTORY:  Notable for multiple family members with coronary  artery disease and the patient's father died of a myocardial infarction  at age 78.  There is no history of premature coronary artery disease.   SOCIAL HISTORY:  The patient is married and lives with his wife in [place].  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  He has enjoyed a very active physical lifestyle all his life.  He has a  remote history of tobacco use, but he quit smoking more than 30 years  ago.  He denies significant alcohol consumption.   CURRENT MEDICATIONS:  1. Enalapril 10 mg daily.  2. Crestor 10 mg daily  3. Metoprolol 12.5 mg daily  4. Amlodipine 5 mg daily  5. Aspirin 81 mg daily  6. Multivitamin 1 tablet  daily.   DRUG ALLERGIES:  FLAGYL CAUSES A SKIN RASH.   PHYSICAL EXAMINATION:  GENERAL:  The patient is a well-appearing male  who appears his stated age in no acute distress.  VITAL SIGNS:  Blood pressure is 154/80, pulse 60 and regular, oxygen  saturation 98% on room air.  HEENT:  Grossly unremarkable.  NECK:  Supple.  There is no cervical nor supraclavicular  lymphadenopathy.  There is no jugular venous distension.  No carotid  bruits are noted.  CHEST:  Auscultation of the chest reveals clear breath sounds  bilaterally which are symmetrical.  No wheezes or rhonchi are  demonstrated.  CARDIOVASCULAR:  Reveals a regular rate and rhythm.  No murmurs, rubs or  gallops are noted.  ABDOMEN:  Soft, nontender.  There are no palpable masses.  Bowel sounds  are present.  EXTREMITIES:  Warm and well-perfused.  There is mild bilateral lower  extremity edema.  Distal pulses are diminished but trace palpable at the  ankles.  RECTAL:  Deferred.  GU:  Deferred.  NEUROLOGIC:  Grossly nonfocal symmetrical throughout.   DIAGNOSTIC TESTS:  Cardiac catheterization performed today by Dr. Excell Seltzer  is reviewed.  This demonstrates severe three-vessel coronary artery  disease with normal left ventricular function.  Specifically, the left  anterior descending coronary artery is diffusely calcified with 70%  proximal stenosis and 60% stenosis after a large first diagonal branch  and 85% stenosis of mid left anterior descending coronary artery and 70%  stenosis of the distal left anterior descending coronary artery.  There  is 90% ostial stenosis of the left circumflex coronary artery.  There is  50% stenosis of the mid-right coronary artery and 50-60% stenosis of the  distal right coronary artery.  The posterior descending coronary artery  appears small and is not well visualized.  There is a large right  posterolateral branch.   IMPRESSION: 1. Severe three-vessel coronary artery disease with normal  left      ventricular function.  2. Mr. Redner presents with classical symptoms of progressive      exertional angina.   He has coronary anatomy which would be unfavorable for percutaneous  coronary intervention, particularly his left circumflex coronary lesion  and multiple serial lesions which are heavily calcified in the left  anterior descending coronary artery.  I believe he would best be treated  with surgical revascularization.   PLAN:  I have discussed options at length with Mr. Dasaro and his wife  here in the office today.  Alternative treatment strategies have been  discussed.  They understand and accept all associated  risks of surgery  including, but not limited to, risk of death, stroke, myocardial  infarction, congestive heart failure, respiratory failure, pneumonia,  bleeding requiring blood transfusion, arrhythmia, infection, and  recurrent coronary artery disease.  All their questions have been  addressed.  We tentatively plan to proceed with surgery on Wednesday  December 12th.  Mr. Nevares understands that during the interim period of  time should he develop chest pain unrelieved with sublingual  nitroglycerin or chest pain occurring at rest, he will prompted present  to the emergency room for treatment.      Salvatore Decent. Cornelius Moras, M.D.  Electronically Signed     CHO/MEDQ  D:  07/04/2006  T:  07/05/2006  Job:  [id number]   cc:   Madolyn Frieze. Jens Som, MD, [facility]  Veverly Fells. Excell Seltzer, MD  Wallace Cullens

## 2010-12-18 NOTE — Assessment & Plan Note (Signed)
[facility]                            CARDIOLOGY OFFICE NOTE   Sergio, Bush                       MRN:          [id number]  DATE:06/29/2006                            DOB:          05/13/1934    [name] is a 77 year old male with a past medical history of  hypertension and hyperlipidemia, whom we are asked to evaluate for  angina and an abnormal treadmill.  The patient has no prior cardiac  history.  Over the past 2 months he has noticed the substernal chest  tightness that occurs with exertion and is relieved with rest.  The pain  does not radiate.  It is not pleuritic or exertional, nor is it related  to food.  There is no associated nausea, vomiting, or diaphoresis, but  there is shortness of breath.  It typically occurs only with more  rigorous activities, but not with routine activities around the house,  and it has not occurred at rest.  He had an exercise treadmill recently  that I do not have the full results for, but apparently, he had  electrocardiographic changes as well as chest pain during the stress  test.  Because of this, we were asked to further evaluate.   MEDICATIONS AT PRESENT:  1. Enalapril 10 mg p.o. daily.  2. Crestor 10 mg p.o. daily.  3. Amlodipine 5 mg p.o. daily.  4. Aspirin 325 mg p.o. q.3 days.   ALLERGIES:  FLAGYL.   SOCIAL HISTORY:  He does not smoke.  He smoked for a brief time, but has  not since the 70s.  He does not consume alcohol.   FAMILY HISTORY:  Positive for coronary artery disease.   PAST MEDICAL HISTORY:  Significant for hypertension and hyperlipidemia,  but there is no diabetes mellitus.  He has had nephrolithiasis and an  appendectomy, but there is no other past medical history noted.   REVIEW OF SYSTEMS:  He denies any headaches.  No fevers or chills.  There is no productive cough or hemoptysis.  There is no dysphagia,  odynophagia, melena, or hematochezia.  There is no dysuria or  hematuria.  There is no rash or seizure activity.  There is no orthopnea, PND, or  pedal edema.  There is no claudication noted.  The remaining systems are  negative.   PHYSICAL EXAM:  He weighs 267 pounds and his blood pressure is 139/83  with a pulse of 56.  He is well-developed, well-nourished in no acute distress.  SKIN:  Warm and dry.  He does not have any peripheral clubbing.  HEENT:  Unremarkable with normal eyelids.  NECK:  Supple with a normal upstroke bilaterally and I cannot appreciate  bruits.  There is no jugular venous distension and no thyromegaly is  noted.  BACK:  Normal.  CHEST:  Clear to auscultation with normal expansion.  CARDIOVASCULAR:  Regular rate and rhythm.  Normal S1 and S2.  There is a  1/6 to 2/6 systolic ejection murmur at the left sternal border.  S2 is  preserved.  There is no S3 or S4.  His  PMI is nondisplaced.  ABDOMEN:  Not tender or distended.  Positive bowel sounds.  No  hepatosplenomegaly.  No masses appreciated.  There is no abdominal  bruit.  He has 2+ femoral pulses bilaterally, no bruits.  EXTREMITIES:  No edema and I can palpate no cords.  He has 2+ posterior  tibial pulses bilaterally.  NEUROLOGIC:  Cranial nerves 2-12 are grossly intact and there are no  gross focal findings.  LYMPHATICS:  No adenopathy diffusely.   His electrocardiogram today shows a sinus rhythm at a rate of 56.  There  is left axis deviation, but there are no ST changes noted.   DIAGNOSES:  1. Recent onset of angina.  2. Abnormal exercise treadmill.  3. Hypertension.  4. Hyperlipidemia.  5. History of nephrolithiasis.   PLAN:  Mr. Capelli is complaining of symptoms that are classic for new-  onset angina.  These typically occur only with more extreme exertion,  but not with routine activities around the house, nor at rest.  I have  asked him to increase his aspirin to 325 mg p.o. daily.  I will also add  Toprol 12.5 mg p.o. daily (we will not add a higher dose  as his resting  heart rate is 56).  He will continue on his Statin.  We will proceed  with an outpatient cardiac catheterization.  The risks and benefits have  been discussed with the patient and he agrees to proceed.  He will need  risk factor modification following his procedure.  Our goal LDL should  be less than 70 given his probable coronary disease.  His blood pressure  appears to be reasonably well-controlled.  I will see him back in  approximately 6 weeks.     Madolyn Frieze Jens Som, MD, [facility]  Electronically Signed    BSC/MedQ  DD: 06/29/2006  DT: 06/29/2006  Job #: [id number]   cc:   Wallace Cullens

## 2010-12-18 NOTE — Assessment & Plan Note (Signed)
[facility]                            CARDIOLOGY OFFICE NOTE   [name], [name]                     MRN:          [id number]  DATE:08/12/2006                            DOB:          March 12, 1934    [name] returns for followup today.  He is a gentleman who I recently  saw for exertional chest pain.  He underwent cardiac catheterization  that showed normal LV function and severe coronary disease.  He  subsequently underwent coronary artery bypassing graft on July 13, 2006 with a LIMA to the LAD, saphenous vein graft to the 1st diagonal,  sequential saphenous vein graft to the circ marginal and PDA, a  saphenous vein graft to the posterior lateral.  Since that time he has  done well.  There is no dyspnea, chest pain, or pedal edema.   MEDICATIONS:  Include:  1. Crestor 10 mg p.o. daily.  2. Aspirin 81 mg p.o. daily.   EXAMINATION:  Shows a blood pressure of 127/80.  His pulse is 80.  He  weighs 154 pounds.  NECK:  Is supple.  CHEST:  Is clear.  CARDIOVASCULAR:  Shows a regular rate and rhythm.  EXTREMITIES:  Show no edema.   His electrocardiogram shows a sinus rhythm at a rate of 80.  There are  no significant ST changes noted.   DIAGNOSES:  1. Coronary artery disease status post coronary artery bypassing      graft.  2. Hyperlipidemia.  3. Hypertension.  4. History of nephrolithiasis.   ALLERGY:  To TOPROL.   PLAN:  [name] is doing well from a symptomatic standpoint.  We will  continue with his aspirin. He is asked to discontinue his Crestor and  begin Zocor due to financial reasons.  We will begin 40 mg p.o. nightly  and we will check lipids and liver in 6 weeks and adjust as indicated  with a goal of LDL of less than 70.  We will also assume his Vasotec at  10 mg p.o. daily.  He will continue with risk factor modifications  including diet and exercise.  He does not smoke.  I will see him back in  approximately 6  months.     Madolyn Frieze Sergio Som, MD, [facility]  Electronically Signed    BSC/MedQ  DD: 08/12/2006  DT: 08/12/2006  Job #: [id number]

## 2010-12-18 NOTE — Cardiovascular Report (Signed)
[name], [name]              ACCOUNT NO.:  [id number]   MEDICAL RECORD NO.:  [id number]          PATIENT TYPE:  OIB   LOCATION:  1961                         FACILITY:  [facility]   PHYSICIAN:  Veverly Fells. Excell Seltzer, MD  DATE OF BIRTH:  01-26-1934   DATE OF PROCEDURE:  DATE OF DISCHARGE:                            CARDIAC CATHETERIZATION   Audio too short to transcribe (less than 5 seconds)      Veverly Fells. Excell Seltzer, MD     MDC/MEDQ  D:  07/04/2006  T:  07/04/2006  Job:  [id number]

## 2011-01-11 ENCOUNTER — Ambulatory Visit (INDEPENDENT_AMBULATORY_CARE_PROVIDER_SITE_OTHER): Payer: Medicare Other | Admitting: *Deleted

## 2011-01-11 DIAGNOSIS — G459 Transient cerebral ischemic attack, unspecified: Secondary | ICD-10-CM

## 2011-02-08 ENCOUNTER — Ambulatory Visit (INDEPENDENT_AMBULATORY_CARE_PROVIDER_SITE_OTHER): Payer: Medicare Other | Admitting: *Deleted

## 2011-02-08 DIAGNOSIS — G459 Transient cerebral ischemic attack, unspecified: Secondary | ICD-10-CM

## 2011-03-08 ENCOUNTER — Ambulatory Visit (INDEPENDENT_AMBULATORY_CARE_PROVIDER_SITE_OTHER): Payer: Medicare Other | Admitting: *Deleted

## 2011-03-08 DIAGNOSIS — G459 Transient cerebral ischemic attack, unspecified: Secondary | ICD-10-CM

## 2011-03-08 LAB — POCT INR: INR: 2.6

## 2011-04-06 ENCOUNTER — Ambulatory Visit (INDEPENDENT_AMBULATORY_CARE_PROVIDER_SITE_OTHER): Payer: Medicare Other | Admitting: *Deleted

## 2011-04-06 DIAGNOSIS — G459 Transient cerebral ischemic attack, unspecified: Secondary | ICD-10-CM

## 2011-04-06 LAB — POCT INR: INR: 2

## 2011-05-04 ENCOUNTER — Ambulatory Visit (INDEPENDENT_AMBULATORY_CARE_PROVIDER_SITE_OTHER): Payer: Medicare Other | Admitting: *Deleted

## 2011-05-04 DIAGNOSIS — G459 Transient cerebral ischemic attack, unspecified: Secondary | ICD-10-CM

## 2011-06-02 ENCOUNTER — Encounter: Payer: Self-pay | Admitting: *Deleted

## 2011-06-03 ENCOUNTER — Ambulatory Visit (INDEPENDENT_AMBULATORY_CARE_PROVIDER_SITE_OTHER): Payer: Medicare Other | Admitting: Cardiology

## 2011-06-03 ENCOUNTER — Ambulatory Visit (INDEPENDENT_AMBULATORY_CARE_PROVIDER_SITE_OTHER): Payer: Medicare Other | Admitting: *Deleted

## 2011-06-03 ENCOUNTER — Encounter: Payer: Self-pay | Admitting: Cardiology

## 2011-06-03 VITALS — BP 129/77 | HR 63 | Ht 76.0 in | Wt 250.0 lb

## 2011-06-03 DIAGNOSIS — E7800 Pure hypercholesterolemia, unspecified: Secondary | ICD-10-CM

## 2011-06-03 DIAGNOSIS — I10 Essential (primary) hypertension: Secondary | ICD-10-CM

## 2011-06-03 DIAGNOSIS — I2510 Atherosclerotic heart disease of native coronary artery without angina pectoris: Secondary | ICD-10-CM

## 2011-06-03 DIAGNOSIS — R42 Dizziness and giddiness: Secondary | ICD-10-CM | POA: Insufficient documentation

## 2011-06-03 DIAGNOSIS — G459 Transient cerebral ischemic attack, unspecified: Secondary | ICD-10-CM

## 2011-06-03 DIAGNOSIS — I4891 Unspecified atrial fibrillation: Secondary | ICD-10-CM

## 2011-06-03 DIAGNOSIS — Z7901 Long term (current) use of anticoagulants: Secondary | ICD-10-CM

## 2011-06-03 DIAGNOSIS — I359 Nonrheumatic aortic valve disorder, unspecified: Secondary | ICD-10-CM

## 2011-06-03 LAB — POCT INR: INR: 2.6

## 2011-06-03 NOTE — Assessment & Plan Note (Signed)
Intolerant to statins. Continue diet. 

## 2011-06-03 NOTE — Assessment & Plan Note (Signed)
Blood pressure controlled. Continue present medications. 

## 2011-06-03 NOTE — Assessment & Plan Note (Signed)
The patient remains in sinus rhythm. Continue Coumadin with goal INR 2-3. He will check with his insurance company to see if they will cover pradaxa or xeralto.

## 2011-06-03 NOTE — Assessment & Plan Note (Signed)
Patient had a brief dizzy spell for seconds. He has conduction abnormalities on his electrocardiogram. No syncope. If recurrent symptoms in the future will place a CardioNet to exclude bradycardia mediated events.

## 2011-06-03 NOTE — Assessment & Plan Note (Signed)
Continue aspirin and ACE inhibitor. Intolerant to statins. Plan Myoview when he returns in one year.

## 2011-06-03 NOTE — Progress Notes (Signed)
HPI:[name] is a pleasant  gentleman who has a history of coronary artery disease.  He underwent coronary bypass graft in December 2007, with a LIMA to LAD, saphenous vein graft to first diagonal with sequential to the circumflex, saphenous graft to the PDA with sequential to a right posterolateral. We repeated an echocardiogram in February of 2012. This revealed normal LV function, biatrial enlargement, mild aortic stenosis with a mean gradient of 12 mm of mercury, mild aortic and mitral regurgitation. Carotid Dopplers revealed 0-39% stenosis bilaterally with followup recommended in 2 years. A Holter monitor showed brief atrial fibrillation. I last saw him in March of 2012. Since then, the patient denies any dyspnea on exertion, orthopnea, PND, pedal edema, palpitations, syncope or chest pain. Patient states several weeks ago he did have a brief dizzy spell for seconds. No syncope. No associated symptoms. He has had no bleeding.   Current Outpatient Prescriptions  Medication Sig Dispense Refill  . aspirin 81 MG tablet Take 81 mg by mouth daily.        . Cholecalciferol (VITAMIN D) 2000 UNITS CAPS Take 1 capsule by mouth daily.        Marland Kitchen doxycycline (DORYX) 100 MG DR capsule Take 100 mg by mouth 2 (two) times daily.        . Dutasteride-Tamsulosin HCl (JALYN) 0.5-0.4 MG CAPS Take 1 tablet by mouth daily.        . enalapril (VASOTEC) 10 MG tablet Take 10 mg by mouth daily.        Marland Kitchen warfarin (COUMADIN) 5 MG tablet Take by mouth as directed.           Past Medical History  Diagnosis Date  . TIA   . HYPERTENSION   . HYPERLIPIDEMIA   . HYPERCHOLESTEROLEMIA   . FIBRILLATION, ATRIAL   . CAD   . BRADYCARDIA   . Aortic valve disorders   . NEPHROLITHIASIS   . Encounter for long-term (current) use of anticoagulants     Past Surgical History  Procedure Date  . Coronary artery bypass graft     History   Social History  . Marital Status: Married    Spouse Name: N/A    Number of Children:  N/A  . Years of Education: N/A   Occupational History  . Not on file.   Social History Main Topics  . Smoking status: Former Games developer  . Smokeless tobacco: Not on file  . Alcohol Use: Not on file  . Drug Use: Not on file  . Sexually Active: Not on file   Other Topics Concern  . Not on file   Social History Narrative  . No narrative on file    ROS: no fevers or chills, productive cough, hemoptysis, dysphasia, odynophagia, melena, hematochezia, dysuria, hematuria, rash, seizure activity, orthopnea, PND, pedal edema, claudication. Remaining systems are negative.  Physical Exam: Well-developed well-nourished in no acute distress.  Skin is warm and dry.  HEENT is normal.  Neck is supple. No thyromegaly.  Chest is clear to auscultation with normal expansion.  Cardiovascular exam is regular rate and rhythm. 2/6 systolic murmur left sternal border.  Abdominal exam nontender or distended. No masses palpated. Extremities show no edema. neuro grossly intact  ECG sinus rhythm at a rate of 63. First degree AV block. Prior inferior infarct. RV conduction delay.

## 2011-06-03 NOTE — Patient Instructions (Signed)
Your physician wants you to follow-up in: ONE YEAR You will receive a reminder letter in the mail two months in advance. If you don't receive a letter, please call our office to schedule the follow-up appointment.   PRADAXA OR XARELTO

## 2011-06-03 NOTE — Assessment & Plan Note (Signed)
Plan repeat echocardiogram in one year when he returns for aortic stenosis.

## 2011-06-05 ENCOUNTER — Other Ambulatory Visit: Payer: Self-pay | Admitting: Cardiology

## 2011-07-15 ENCOUNTER — Ambulatory Visit (INDEPENDENT_AMBULATORY_CARE_PROVIDER_SITE_OTHER): Payer: Medicare Other | Admitting: *Deleted

## 2011-07-15 DIAGNOSIS — Z7901 Long term (current) use of anticoagulants: Secondary | ICD-10-CM

## 2011-07-15 DIAGNOSIS — G459 Transient cerebral ischemic attack, unspecified: Secondary | ICD-10-CM

## 2011-07-15 LAB — POCT INR: INR: 1.9

## 2011-08-03 HISTORY — PX: LITHOTRIPSY: SUR834

## 2011-08-12 ENCOUNTER — Encounter: Payer: Medicare Other | Admitting: *Deleted

## 2012-01-06 ENCOUNTER — Telehealth: Payer: Self-pay | Admitting: Cardiology

## 2012-01-06 NOTE — Telephone Encounter (Signed)
Pt scheduled to see Norma Fredrickson NP on 01/11/12 to discuss surgical clearance.

## 2012-01-06 NOTE — Telephone Encounter (Signed)
Pt having hip surgery by dr Tresa Endo, July 9th, pt needs surgical clearance, they won't "confirm" the surgery until we clear him, wants to know if any other dr here can ok him?, pls call  [phone]

## 2012-01-11 ENCOUNTER — Encounter: Payer: Self-pay | Admitting: Nurse Practitioner

## 2012-01-11 ENCOUNTER — Ambulatory Visit (INDEPENDENT_AMBULATORY_CARE_PROVIDER_SITE_OTHER): Payer: Medicare Other | Admitting: Pharmacist

## 2012-01-11 ENCOUNTER — Ambulatory Visit (INDEPENDENT_AMBULATORY_CARE_PROVIDER_SITE_OTHER): Payer: Medicare Other | Admitting: Nurse Practitioner

## 2012-01-11 ENCOUNTER — Ambulatory Visit (HOSPITAL_COMMUNITY): Payer: Medicare Other | Attending: Internal Medicine

## 2012-01-11 VITALS — BP 160/80 | HR 53 | Ht 76.0 in | Wt 258.2 lb

## 2012-01-11 DIAGNOSIS — G459 Transient cerebral ischemic attack, unspecified: Secondary | ICD-10-CM

## 2012-01-11 DIAGNOSIS — I359 Nonrheumatic aortic valve disorder, unspecified: Secondary | ICD-10-CM | POA: Insufficient documentation

## 2012-01-11 DIAGNOSIS — Z7901 Long term (current) use of anticoagulants: Secondary | ICD-10-CM

## 2012-01-11 DIAGNOSIS — E785 Hyperlipidemia, unspecified: Secondary | ICD-10-CM

## 2012-01-11 DIAGNOSIS — I10 Essential (primary) hypertension: Secondary | ICD-10-CM | POA: Insufficient documentation

## 2012-01-11 DIAGNOSIS — D472 Monoclonal gammopathy: Secondary | ICD-10-CM | POA: Insufficient documentation

## 2012-01-11 DIAGNOSIS — I480 Paroxysmal atrial fibrillation: Secondary | ICD-10-CM

## 2012-01-11 DIAGNOSIS — Z01818 Encounter for other preprocedural examination: Secondary | ICD-10-CM | POA: Insufficient documentation

## 2012-01-11 DIAGNOSIS — I350 Nonrheumatic aortic (valve) stenosis: Secondary | ICD-10-CM

## 2012-01-11 DIAGNOSIS — I2510 Atherosclerotic heart disease of native coronary artery without angina pectoris: Secondary | ICD-10-CM | POA: Insufficient documentation

## 2012-01-11 DIAGNOSIS — R0989 Other specified symptoms and signs involving the circulatory and respiratory systems: Secondary | ICD-10-CM

## 2012-01-11 DIAGNOSIS — I4891 Unspecified atrial fibrillation: Secondary | ICD-10-CM

## 2012-01-11 DIAGNOSIS — I517 Cardiomegaly: Secondary | ICD-10-CM | POA: Insufficient documentation

## 2012-01-11 DIAGNOSIS — Z8673 Personal history of transient ischemic attack (TIA), and cerebral infarction without residual deficits: Secondary | ICD-10-CM | POA: Insufficient documentation

## 2012-01-11 LAB — LDL CHOLESTEROL, DIRECT: Direct LDL: 157 mg/dL

## 2012-01-11 LAB — HEPATIC FUNCTION PANEL
ALT: 18 U/L (ref 0–53)
AST: 20 U/L (ref 0–37)
Albumin: 3.9 g/dL (ref 3.5–5.2)
Alkaline Phosphatase: 59 U/L (ref 39–117)
Bilirubin, Direct: 0 mg/dL (ref 0.0–0.3)
Total Bilirubin: 0.4 mg/dL (ref 0.3–1.2)
Total Protein: 6.7 g/dL (ref 6.0–8.3)

## 2012-01-11 LAB — CBC WITH DIFFERENTIAL/PLATELET
Basophils Absolute: 0.1 10*3/uL (ref 0.0–0.1)
Basophils Relative: 0.8 % (ref 0.0–3.0)
Eosinophils Absolute: 0.2 10*3/uL (ref 0.0–0.7)
Eosinophils Relative: 2.1 % (ref 0.0–5.0)
HCT: 43.9 % (ref 39.0–52.0)
Hemoglobin: 14.7 g/dL (ref 13.0–17.0)
Lymphocytes Relative: 29.2 % (ref 12.0–46.0)
Lymphs Abs: 2.2 10*3/uL (ref 0.7–4.0)
MCHC: 33.5 g/dL (ref 30.0–36.0)
MCV: 89.8 fl (ref 78.0–100.0)
Monocytes Absolute: 0.8 10*3/uL (ref 0.1–1.0)
Monocytes Relative: 10.9 % (ref 3.0–12.0)
Neutro Abs: 4.2 10*3/uL (ref 1.4–7.7)
Neutrophils Relative %: 57 % (ref 43.0–77.0)
Platelets: 240 10*3/uL (ref 150.0–400.0)
RBC: 4.89 Mil/uL (ref 4.22–5.81)
RDW: 14 % (ref 11.5–14.6)
WBC: 7.4 10*3/uL (ref 4.5–10.5)

## 2012-01-11 LAB — LIPID PANEL
Cholesterol: 216 mg/dL — ABNORMAL HIGH (ref 0–200)
HDL: 38.9 mg/dL — ABNORMAL LOW (ref >39.00)
Total CHOL/HDL Ratio: 6
Triglycerides: 198 mg/dL — ABNORMAL HIGH (ref 0.0–149.0)
VLDL: 39.6 mg/dL (ref 0.0–40.0)

## 2012-01-11 LAB — TSH: TSH: 1.27 u[IU]/mL (ref 0.35–5.50)

## 2012-01-11 LAB — POCT INR: INR: 2.2

## 2012-01-11 LAB — BASIC METABOLIC PANEL
BUN: 20 mg/dL (ref 6–23)
CO2: 27 mEq/L (ref 19–32)
Calcium: 8.9 mg/dL (ref 8.4–10.5)
Chloride: 108 mEq/L (ref 96–112)
Creatinine, Ser: 1.3 mg/dL (ref 0.4–1.5)
GFR: 58.14 mL/min — ABNORMAL LOW (ref >60.00)
Glucose, Bld: 91 mg/dL (ref 70–99)
Potassium: 4.3 mEq/L (ref 3.5–5.1)
Sodium: 142 mEq/L (ref 135–145)

## 2012-01-11 MED ORDER — WARFARIN SODIUM 5 MG PO TABS: ORAL_TABLET | ORAL | Status: DC

## 2012-01-11 NOTE — Patient Instructions (Addendum)
We are going to get your coumadin checked today  We are going to check your other labs today as well  We will arrange for an ultrasound of your heart. If it looks ok, we will give you your clearance for surgery.  Plan on keeping your November appointment with Dr. Jens Som   Call the [facility] office at [phone] if you have any questions, problems or concerns.

## 2012-01-11 NOTE — Progress Notes (Signed)
Echocardiogram performed.  

## 2012-01-11 NOTE — Progress Notes (Signed)
[name] Date of Birth: 10/15/1933 Medical Record #[id number]  History of Present Illness: [name] is seen today for surgical clearance. He is seen for Dr. Jens Som. He is a very pleasant 78 year old male with known CAD, remote CABG in 2007, HTN, HLD with statin intolerant, PAF, coumadin therapy, past TIA, carotid disease, and kidney stones. He last echo was in February of 2012 and annual follow up was recommended.  He comes in today. He says he is doing very well. Energy level is good. No chest pain. Not short of breath. Not dizzy or lightheaded. No syncope. Has had no recent labs. Has had more issues over the past few months with his kidney stones and has been off and on his coumadin. It has not been checked since December. 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 Blood pressure at home tends to run higher in the doctor's office but he reports very good control at home with readings in the 120 - 130 systolic range. He has had no exertional shortness of breath, which was his presentation prior to his heart surgery. Overall, he thinks he is doing very well. His surgery will be at [facility].   Current Outpatient Prescriptions on File Prior to Visit  Medication Sig Dispense Refill  . aspirin 81 MG tablet Take 81 mg by mouth daily.        . Cholecalciferol (VITAMIN D) 2000 UNITS CAPS Take 1 capsule by mouth daily.        . enalapril (VASOTEC) 10 MG tablet TAKE ONE TABLET BY MOUTH EVERY DAY  30 tablet  12  . warfarin (COUMADIN) 5 MG tablet Take by mouth as directed.          Allergies  Allergen Reactions  . Metronidazole     Past Medical History  Diagnosis Date  . TIA Feb 2012  . HYPERTENSION   . HYPERLIPIDEMIA   . HYPERCHOLESTEROLEMIA     statin intolerant  . PAF (paroxysmal atrial fibrillation)   . CAD     s/p CABG in 2007 x 5  . BRADYCARDIA   . Aortic stenosis   . NEPHROLITHIASIS   . Encounter for long-term (current) use of anticoagulants   . Carotid  stenosis     0-39% bilaterally    Past Surgical History  Procedure Date  . Coronary artery bypass graft 2007    x 5    History  Smoking status  . Former Smoker  Smokeless tobacco  . Not on file    History  Alcohol Use No    Family History  Problem Relation Age of Onset  . Heart disease Mother   . Heart failure Mother   . Heart attack Father     Review of Systems: The review of systems is positive for hip pain.  He does bruise easily with his coumadin. All other systems were reviewed and are negative.  Physical Exam: BP 174/82  Pulse 53  Ht 6\' 4"  (1.93 m)  Wt 258 lb 3.2 oz (117.119 kg)  BMI 31.43 kg/m2  Blood pressure by me is down to 160/80. Patient is very pleasant and in no acute distress. He is obese. Skin is warm and dry. Color is normal.  HEENT is unremarkable. Normocephalic/atraumatic. PERRL. Sclera are nonicteric. Neck is supple. No masses. No JVD. Lungs are clear. Cardiac exam shows a regular rate and rhythm. He has a 2/6 outflow murmur of AS. Abdomen is obese but soft. Extremities are  without edema. Gait and ROM are intact. No gross neurologic deficits noted.   LABORATORY DATA: PENDING  INR is pending  EKG shows incomplete R BBB, first degree AV block with sinus bradycardia. It is unchanged from prior tracing.   Lab Results  Component Value Date   INR 1.9 07/15/2011   INR 2.6 06/03/2011   INR 2.0 05/04/2011   Lab Results  Component Value Date   GLUCOSE 107* 05/29/2009   CHOL 223* 05/29/2009   TRIG 225.0* 05/29/2009   HDL 29.00* 05/29/2009   LDLDIRECT 167.8 05/29/2009   LDLCALC 134* 06/11/2008   ALT 21 06/11/2008   AST 22 06/11/2008   NA 136 05/29/2009   K 3.8 05/29/2009   CL 104 05/29/2009   CREATININE 1.3 05/29/2009   BUN 19 05/29/2009   CO2 25 05/29/2009   INR 1.9 07/15/2011    Assessment / Plan:

## 2012-01-11 NOTE — Assessment & Plan Note (Signed)
Patient is doing well form a cardiac standpoint with no symptoms at the present time. He does need his echo and labs updated and we will go ahead and get that done today. Will also get his coumadin checked today. He says his blood pressure at home is satisfactory. He should be a satisfactory candidate for his upcoming hip replacement. We will tentatively see him back in November at his regular appointment time with Dr. Jens Som. Patient is agreeable to this plan and will call if any problems develop in the interim.

## 2012-01-12 ENCOUNTER — Other Ambulatory Visit: Payer: Self-pay | Admitting: *Deleted

## 2012-01-12 MED ORDER — EZETIMIBE 10 MG PO TABS: 10.0000 mg | ORAL_TABLET | Freq: Every day | ORAL | Status: DC

## 2012-01-13 ENCOUNTER — Telehealth: Payer: Self-pay | Admitting: *Deleted

## 2012-01-13 DIAGNOSIS — I2510 Atherosclerotic heart disease of native coronary artery without angina pectoris: Secondary | ICD-10-CM

## 2012-01-13 NOTE — Telephone Encounter (Signed)
When discussing the pts echo results the pts wife states the pt was going to have hip surgery at [facility] and needed clearance. Per dr Jens Som the pt will need a lexiscan myoview to clear. Left message for pt to call to schedule.

## 2012-01-14 NOTE — Telephone Encounter (Signed)
Pt was notified of need for lexiscan myoview.  Lexiscan was ordered.  Pt was given instructions over the phone and then mailed.  [facility] desk notified of need to schedule.

## 2012-01-20 ENCOUNTER — Ambulatory Visit (HOSPITAL_COMMUNITY): Payer: Medicare Other | Attending: Cardiology | Admitting: Radiology

## 2012-01-20 VITALS — BP 141/79 | HR 56 | Ht 76.0 in | Wt 245.0 lb

## 2012-01-20 DIAGNOSIS — I359 Nonrheumatic aortic valve disorder, unspecified: Secondary | ICD-10-CM | POA: Insufficient documentation

## 2012-01-20 DIAGNOSIS — I2510 Atherosclerotic heart disease of native coronary artery without angina pectoris: Secondary | ICD-10-CM

## 2012-01-20 DIAGNOSIS — Z8673 Personal history of transient ischemic attack (TIA), and cerebral infarction without residual deficits: Secondary | ICD-10-CM | POA: Insufficient documentation

## 2012-01-20 DIAGNOSIS — I4891 Unspecified atrial fibrillation: Secondary | ICD-10-CM | POA: Insufficient documentation

## 2012-01-20 DIAGNOSIS — I10 Essential (primary) hypertension: Secondary | ICD-10-CM | POA: Insufficient documentation

## 2012-01-20 DIAGNOSIS — I4510 Unspecified right bundle-branch block: Secondary | ICD-10-CM | POA: Insufficient documentation

## 2012-01-20 DIAGNOSIS — I779 Disorder of arteries and arterioles, unspecified: Secondary | ICD-10-CM | POA: Insufficient documentation

## 2012-01-20 DIAGNOSIS — E785 Hyperlipidemia, unspecified: Secondary | ICD-10-CM | POA: Insufficient documentation

## 2012-01-20 DIAGNOSIS — Z8249 Family history of ischemic heart disease and other diseases of the circulatory system: Secondary | ICD-10-CM | POA: Insufficient documentation

## 2012-01-20 DIAGNOSIS — Z87891 Personal history of nicotine dependence: Secondary | ICD-10-CM | POA: Insufficient documentation

## 2012-01-20 MED ORDER — TECHNETIUM TC 99M TETROFOSMIN IV KIT
33.0000 | PACK | Freq: Once | INTRAVENOUS | Status: AC | PRN
  Administered 2012-01-20: 33 via INTRAVENOUS

## 2012-01-20 MED ORDER — REGADENOSON 0.4 MG/5ML IV SOLN
0.4000 mg | Freq: Once | INTRAVENOUS | Status: AC
  Administered 2012-01-20: 0.4 mg via INTRAVENOUS

## 2012-01-20 MED ORDER — TECHNETIUM TC 99M TETROFOSMIN IV KIT
11.0000 | PACK | Freq: Once | INTRAVENOUS | Status: AC | PRN
  Administered 2012-01-20: 11 via INTRAVENOUS

## 2012-01-20 NOTE — Progress Notes (Signed)
[facility] SITE 3 NUCLEAR MED [address] [place] [address] [phone]  Cardiology Nuclear Med Study  [name] is a 78 y.o. male     MRN : [id number]     DOB: [birth year]  Procedure Date: 01/20/2012  Nuclear Med Background Indication for Stress Test:  Evaluation for Ischemia, Graft Patency and Clearance for Pending (L) THR @ [facility]  History:  '07 MPS>Cath>CABG; 01/11/12 Echo:EF=55%, mild AS, trivial AR; PAF Cardiac Risk Factors: Carotid Disease, Family History - CAD, History of Smoking, Hypertension, Lipids, IRBBB and TIA  Symptoms:  No cardiac complaints.   Nuclear Pre-Procedure Caffeine/Decaff Intake:  None NPO After: 7:30am   Lungs:  Clear. O2 Sat: 99% on room air. IV 0.9% NS with Angio Cath:  20g  IV Site: R Hand  IV Started by:  Bonnita Levan, RN  Chest Size (in):  46 Cup Size: n/a  Height: 6\' 4"  (1.93 m)  Weight:  245 lb (111.131 kg)  BMI:  Body mass index is 29.82 kg/(m^2). Tech Comments:  N/A    Nuclear Med Study 1 or 2 day study: 1 day  Stress Test Type:  Lexiscan  Reading MD: Marca Ancona, MD  Order Authorizing Provider:  Olga Millers, MD  Resting Radionuclide: Technetium 8m Tetrofosmin  Resting Radionuclide Dose: 11.0 mCi   Stress Radionuclide:  Technetium 55m Tetrofosmin  Stress Radionuclide Dose: 33.0 mCi           Stress Protocol Rest HR: 56 Stress HR: 77  Rest BP: 141/79 Stress BP: 133/80  Exercise Time (min): n/a METS: n/a   Predicted Max HR: 145 bpm % Max HR: 53.1 bpm Rate Pressure Product: 78469   Dose of Adenosine (mg):  n/a Dose of Lexiscan: 0.4 mg  Dose of Atropine (mg): n/a Dose of Dobutamine: n/a mcg/kg/min (at max HR)  Stress Test Technologist: Smiley Houseman, CMA-N  Nuclear Technologist:  Doyne Keel, CNMT     Rest Procedure:  Myocardial perfusion imaging was performed at rest 45 minutes following the intravenous administration of Technetium 35m Tetrofosmin.  Rest ECG: IRBBB  Stress Procedure:  The  patient received IV Lexiscan 0.4 mg over 15-seconds.  Technetium 71m Tetrofosmin injected at 30-seconds.  There were no significant changes with Lexiscan bolus, occasional PAC's noted.  Quantitative spect images were obtained after a 45 minute delay.  Stress ECG: No significant change from baseline ECG  QPS Raw Data Images:  Normal; no motion artifact; normal heart/lung ratio. Stress Images:  Small, mild mid inferior defect.  Small apical perfusion defect.  Rest Images:  Small, mild mid inferior perfusion defect.  Small apical perfusion defect.  Subtraction (SDS):  Fixed small mid inferior and small apical perfusion defects.  Transient Ischemic Dilatation (Normal <1.22):  0.97 Lung/Heart Ratio (Normal <0.45):  0.28  Quantitative Gated Spect Images QGS EDV:  122 ml QGS ESV:  40 ml  Impression Exercise Capacity:  Lexiscan with no exercise. BP Response:  Normal blood pressure response. Clinical Symptoms:  "head feels funny" ECG Impression:  No significant ST segment change suggestive of ischemia. Comparison with Prior Nuclear Study: No images to compare  Overall Impression:  Low risk stress nuclear study.  Fixed, mild mid inferior perfusion defect seems most likely to be diaphragmatic attenuation.  Fixed small apical perfusion defect is likely apical thinning.  No ischemia.  Probably normal study.   LV Ejection Fraction: 67%.  LV Wall Motion:  NL LV Function; NL Wall Motion  [employer]

## 2012-01-28 ENCOUNTER — Telehealth: Payer: Self-pay | Admitting: Cardiology

## 2012-01-28 NOTE — Telephone Encounter (Signed)
H/o tia, would bridge while off coumadin with lovenox, forward to coumadin clinic Olga Millers

## 2012-01-28 NOTE — Telephone Encounter (Signed)
Spoke to patient's wife she stated patient needs to have kidney stone surgery.Wants to know if ok to hold warfarin.Fowarded to [employer] for advice.

## 2012-01-28 NOTE — Telephone Encounter (Signed)
New msg Pt wants to talk to someone about getting off warfarin to have kidney stones removed

## 2012-01-31 ENCOUNTER — Encounter: Payer: Self-pay | Admitting: *Deleted

## 2012-01-31 NOTE — Telephone Encounter (Signed)
Spoke with pt, Aware of dr Ludwig Clarks recommendations. He has an appt tomorrow with [facility] and will let them know what is going on. The surgery date has not been set as of right now.

## 2012-02-01 ENCOUNTER — Ambulatory Visit (INDEPENDENT_AMBULATORY_CARE_PROVIDER_SITE_OTHER): Payer: Medicare Other | Admitting: *Deleted

## 2012-02-01 DIAGNOSIS — Z7901 Long term (current) use of anticoagulants: Secondary | ICD-10-CM

## 2012-02-01 DIAGNOSIS — I4891 Unspecified atrial fibrillation: Secondary | ICD-10-CM

## 2012-02-01 DIAGNOSIS — G459 Transient cerebral ischemic attack, unspecified: Secondary | ICD-10-CM

## 2012-02-01 LAB — POCT INR: INR: 1.6

## 2012-02-15 ENCOUNTER — Ambulatory Visit (INDEPENDENT_AMBULATORY_CARE_PROVIDER_SITE_OTHER): Payer: Medicare Other | Admitting: *Deleted

## 2012-02-15 DIAGNOSIS — Z7901 Long term (current) use of anticoagulants: Secondary | ICD-10-CM

## 2012-02-15 DIAGNOSIS — G459 Transient cerebral ischemic attack, unspecified: Secondary | ICD-10-CM

## 2012-02-15 DIAGNOSIS — I4891 Unspecified atrial fibrillation: Secondary | ICD-10-CM

## 2012-02-15 LAB — POCT INR: INR: 2

## 2012-03-14 ENCOUNTER — Ambulatory Visit (INDEPENDENT_AMBULATORY_CARE_PROVIDER_SITE_OTHER): Payer: Medicare Other | Admitting: *Deleted

## 2012-03-14 DIAGNOSIS — G459 Transient cerebral ischemic attack, unspecified: Secondary | ICD-10-CM

## 2012-03-14 DIAGNOSIS — Z7901 Long term (current) use of anticoagulants: Secondary | ICD-10-CM

## 2012-03-14 DIAGNOSIS — I4891 Unspecified atrial fibrillation: Secondary | ICD-10-CM

## 2012-03-14 LAB — POCT INR: INR: 1.8

## 2012-05-29 ENCOUNTER — Other Ambulatory Visit: Payer: Self-pay | Admitting: Cardiology

## 2012-06-21 ENCOUNTER — Telehealth: Payer: Self-pay | Admitting: Cardiology

## 2012-06-21 NOTE — Telephone Encounter (Signed)
New Problem:    After speaking with the patient's wife and confirming that the patient was not currently scheduled to have an appointment, patient's wife declined offer to schedule an appointment for her husband at this time.

## 2012-07-09 ENCOUNTER — Other Ambulatory Visit: Payer: Self-pay | Admitting: Cardiology

## 2012-08-02 DIAGNOSIS — C61 Malignant neoplasm of prostate: Secondary | ICD-10-CM

## 2012-08-02 HISTORY — DX: Malignant neoplasm of prostate: C61

## 2012-10-16 ENCOUNTER — Encounter (INDEPENDENT_AMBULATORY_CARE_PROVIDER_SITE_OTHER): Payer: Medicare Other

## 2012-10-16 DIAGNOSIS — I6529 Occlusion and stenosis of unspecified carotid artery: Secondary | ICD-10-CM

## 2012-10-27 ENCOUNTER — Encounter: Payer: Self-pay | Admitting: Pharmacist

## 2013-01-16 ENCOUNTER — Encounter: Payer: Self-pay | Admitting: Cardiology

## 2013-01-16 ENCOUNTER — Ambulatory Visit (INDEPENDENT_AMBULATORY_CARE_PROVIDER_SITE_OTHER): Payer: Medicare Other | Admitting: Cardiology

## 2013-01-16 VITALS — BP 120/82 | HR 49 | Wt 249.0 lb

## 2013-01-16 DIAGNOSIS — I10 Essential (primary) hypertension: Secondary | ICD-10-CM

## 2013-01-16 DIAGNOSIS — I679 Cerebrovascular disease, unspecified: Secondary | ICD-10-CM | POA: Insufficient documentation

## 2013-01-16 DIAGNOSIS — I2510 Atherosclerotic heart disease of native coronary artery without angina pectoris: Secondary | ICD-10-CM

## 2013-01-16 DIAGNOSIS — E7800 Pure hypercholesterolemia, unspecified: Secondary | ICD-10-CM

## 2013-01-16 DIAGNOSIS — I4891 Unspecified atrial fibrillation: Secondary | ICD-10-CM

## 2013-01-16 DIAGNOSIS — I359 Nonrheumatic aortic valve disorder, unspecified: Secondary | ICD-10-CM

## 2013-01-16 NOTE — Assessment & Plan Note (Signed)
Repeat carotid Dopplers March 2016.

## 2013-01-16 NOTE — Assessment & Plan Note (Signed)
Patient remains in sinus rhythm. Continue aspirin. He declines Coumadin and understands the risk of CVA.  

## 2013-01-16 NOTE — Patient Instructions (Addendum)
Your physician wants you to follow-up in: ONE YEAR WITH DR CRENSHAW You will receive a reminder letter in the mail two months in advance. If you don't receive a letter, please call our office to schedule the follow-up appointment.  

## 2013-01-16 NOTE — Assessment & Plan Note (Signed)
Aortic stenosis mild on most recent echocardiogram. Plan repeat study when he returns in one year.

## 2013-01-16 NOTE — Assessment & Plan Note (Signed)
Continue diet. Intolerant to statins. 

## 2013-01-16 NOTE — Progress Notes (Signed)
   HPI: [name] is a pleasant gentleman who has a history of coronary artery disease. He underwent coronary bypass graft in December 2007, with a LIMA to LAD, saphenous vein graft to first diagonal with sequential to the circumflex, saphenous graft to the PDA with sequential to a right posterolateral. A previous Holter monitor showed brief atrial fibrillation. Nuclear study in June of 2013 showed an ejection fraction of 67%. There was diaphragmatic attenuation and apical thinning but no ischemia. Echocardiogram in June of 2013 showed normal LV function, severe asymmetric basilar septal hypertrophy, grade 2 diastolic dysfunction, mild aortic stenosis/trace aortic insufficiency, moderate left atrial enlargement and mild right ventricular enlargement. Carotid Dopplers in March of 2014 revealed 0-39% stenosis bilaterally with followup recommended in 2 years. Since he was last seen, the patient denies any dyspnea on exertion, orthopnea, PND, pedal edema, palpitations, syncope or chest pain.   Current Outpatient Prescriptions  Medication Sig Dispense Refill  . aspirin 81 MG tablet Take 81 mg by mouth daily.        . enalapril (VASOTEC) 10 MG tablet TAKE ONE TABLET BY MOUTH EVERY DAY  90 tablet  2  . potassium citrate (UROCIT-K) 10 MEQ (1080 MG) SR tablet Take 2 tablets by mouth 2 (two) times daily.      . tamsulosin (FLOMAX) 0.4 MG CAPS Take 1 capsule by mouth daily.       No current facility-administered medications for this visit.     Past Medical History  Diagnosis Date  . TIA Feb 2012    no residual  . HYPERTENSION   . HYPERCHOLESTEROLEMIA     statin intolerant  . PAF (paroxysmal atrial fibrillation)     briefly noted on Holter monitor  . CAD     s/p CABG in 2007 x 5  . BRADYCARDIA   . Aortic stenosis   . NEPHROLITHIASIS   . Long term (current) use of anticoagulants   . Carotid stenosis     0-39% bilaterally    Past Surgical History  Procedure Laterality Date  . Coronary artery  bypass graft  2007    x 5    History   Social History  . Marital Status: Married    Spouse Name: N/A    Number of Children: N/A  . Years of Education: N/A   Occupational History  . Not on file.   Social History Main Topics  . Smoking status: Former Games developer  . Smokeless tobacco: Not on file  . Alcohol Use: No  . Drug Use: No  . Sexually Active: Not Currently   Other Topics Concern  . Not on file   Social History Narrative  . No narrative on file    ROS: no fevers or chills, productive cough, hemoptysis, dysphasia, odynophagia, melena, hematochezia, dysuria, hematuria, rash, seizure activity, orthopnea, PND, pedal edema, claudication. Remaining systems are negative.  Physical Exam: Well-developed well-nourished in no acute distress.  Skin is warm and dry.  HEENT is normal.  Neck is supple.  Chest is clear to auscultation with normal expansion.  Cardiovascular exam is regular rate and rhythm. 2/6 systolic murmur left sternal border. S2 is not diminished. Abdominal exam nontender or distended. No masses palpated. Extremities show no edema. neuro grossly intact  ECG marked sinus bradycardia at a rate of 49. First degree AV block. Left axis deviation. Incomplete right bundle branch block.

## 2013-01-16 NOTE — Assessment & Plan Note (Signed)
Continue aspirin. Intolerant to statins. 

## 2013-01-16 NOTE — Assessment & Plan Note (Signed)
Blood pressure controlled. Continue present medications. 

## 2013-02-23 ENCOUNTER — Ambulatory Visit: Payer: Self-pay | Admitting: Cardiology

## 2013-02-23 DIAGNOSIS — Z7901 Long term (current) use of anticoagulants: Secondary | ICD-10-CM

## 2013-02-23 DIAGNOSIS — G459 Transient cerebral ischemic attack, unspecified: Secondary | ICD-10-CM

## 2013-02-23 DIAGNOSIS — I4891 Unspecified atrial fibrillation: Secondary | ICD-10-CM

## 2013-03-05 ENCOUNTER — Other Ambulatory Visit: Payer: Self-pay | Admitting: Cardiology

## 2013-08-27 ENCOUNTER — Other Ambulatory Visit: Payer: Self-pay | Admitting: Cardiology

## 2013-11-26 ENCOUNTER — Other Ambulatory Visit: Payer: Self-pay | Admitting: Cardiology

## 2013-12-21 ENCOUNTER — Encounter: Payer: Self-pay | Admitting: Cardiology

## 2014-02-15 ENCOUNTER — Encounter: Payer: Self-pay | Admitting: *Deleted

## 2014-02-15 ENCOUNTER — Ambulatory Visit (INDEPENDENT_AMBULATORY_CARE_PROVIDER_SITE_OTHER): Payer: Medicare Other | Admitting: Cardiology

## 2014-02-15 ENCOUNTER — Encounter: Payer: Self-pay | Admitting: Cardiology

## 2014-02-15 VITALS — BP 130/80 | HR 58 | Ht 76.0 in | Wt 239.9 lb

## 2014-02-15 DIAGNOSIS — I480 Paroxysmal atrial fibrillation: Secondary | ICD-10-CM | POA: Insufficient documentation

## 2014-02-15 DIAGNOSIS — I359 Nonrheumatic aortic valve disorder, unspecified: Secondary | ICD-10-CM

## 2014-02-15 DIAGNOSIS — I679 Cerebrovascular disease, unspecified: Secondary | ICD-10-CM

## 2014-02-15 DIAGNOSIS — I2510 Atherosclerotic heart disease of native coronary artery without angina pectoris: Secondary | ICD-10-CM

## 2014-02-15 DIAGNOSIS — I4891 Unspecified atrial fibrillation: Secondary | ICD-10-CM

## 2014-02-15 LAB — BASIC METABOLIC PANEL WITH GFR
BUN: 20 mg/dL (ref 6–23)
CO2: 26 mEq/L (ref 19–32)
Calcium: 9 mg/dL (ref 8.4–10.5)
Chloride: 106 mEq/L (ref 96–112)
Creat: 1.38 mg/dL — ABNORMAL HIGH (ref 0.50–1.35)
GFR, EST AFRICAN AMERICAN: 57 mL/min — AB
GFR, EST NON AFRICAN AMERICAN: 49 mL/min — AB
GLUCOSE: 90 mg/dL (ref 70–99)
POTASSIUM: 4.4 meq/L (ref 3.5–5.3)
SODIUM: 142 meq/L (ref 135–145)

## 2014-02-15 NOTE — Assessment & Plan Note (Signed)
Blood pressure controlled. Continue present medications. Check potassium and renal function. 

## 2014-02-15 NOTE — Assessment & Plan Note (Signed)
Continue diet. Intolerant to statins. 

## 2014-02-15 NOTE — Patient Instructions (Signed)
Your physician wants you to follow-up in: ONE YEAR WITH DR CRENSHAW You will receive a reminder letter in the mail two months in advance. If you don't receive a letter, please call our office to schedule the follow-up appointment.   Your physician has requested that you have an echocardiogram. Echocardiography is a painless test that uses sound waves to create images of your heart. It provides your doctor with information about the size and shape of your heart and how well your heart's chambers and valves are working. This procedure takes approximately one hour. There are no restrictions for this procedure.   Your physician recommends that you HAVE LAB WORK TODAY 

## 2014-02-15 NOTE — Assessment & Plan Note (Signed)
Continue aspirin. Intolerant to statins. 

## 2014-02-15 NOTE — Assessment & Plan Note (Signed)
Schedule followup Echocardiogram.

## 2014-02-15 NOTE — Assessment & Plan Note (Signed)
Continue aspirin. Followup carotid Dopplers March 2016.

## 2014-02-15 NOTE — Assessment & Plan Note (Signed)
Patient remains in sinus rhythm. Continue aspirin. He declines Coumadin and understands the risk of CVA.

## 2014-02-15 NOTE — Progress Notes (Signed)
      HPI: FU coronary artery disease. He underwent coronary bypass graft in December 2007, with a LIMA to LAD, saphenous vein graft to first diagonal with sequential to the circumflex, saphenous graft to the PDA with sequential to a right posterolateral. A previous Holter monitor showed brief atrial fibrillation. Nuclear study in June of 2013 showed an ejection fraction of 67%. There was diaphragmatic attenuation and apical thinning but no ischemia. Echocardiogram in June of 2013 showed normal LV function, severe asymmetric basilar septal hypertrophy, grade 2 diastolic dysfunction, mild aortic stenosis/trace aortic insufficiency, moderate left atrial enlargement and mild right ventricular enlargement. Carotid Dopplers in March of 2014 revealed 0-39% stenosis bilaterally with followup recommended in 2 years. Since he was last seen, the patient denies any dyspnea on exertion, orthopnea, PND, pedal edema, palpitations, syncope or chest pain.   Current Outpatient Prescriptions  Medication Sig Dispense Refill  . aspirin 81 MG tablet Take 81 mg by mouth daily.        . enalapril (VASOTEC) 10 MG tablet TAKE ONE TABLET BY MOUTH EVERY DAY  90 tablet  1  . potassium citrate (UROCIT-K) 10 MEQ (1080 MG) SR tablet Take 2 tablets by mouth 2 (two) times daily.      . tamsulosin (FLOMAX) 0.4 MG CAPS Take 1 capsule by mouth daily.       No current facility-administered medications for this visit.     Past Medical History  Diagnosis Date  . TIA Feb 2012    no residual  . HYPERTENSION   . HYPERCHOLESTEROLEMIA     statin intolerant  . PAF (paroxysmal atrial fibrillation)     briefly noted on Holter monitor  . CAD     s/p CABG in 2007 x 5  . BRADYCARDIA   . Aortic stenosis   . NEPHROLITHIASIS   . Long term (current) use of anticoagulants   . Carotid stenosis     0-39% bilaterally    Past Surgical History  Procedure Laterality Date  . Coronary artery bypass graft  2007    x 5    History    Social History  . Marital Status: Married    Spouse Name: N/A    Number of Children: N/A  . Years of Education: N/A   Occupational History  . Not on file.   Social History Main Topics  . Smoking status: Former Research scientist (life sciences)  . Smokeless tobacco: Not on file  . Alcohol Use: No  . Drug Use: No  . Sexual Activity: Not Currently   Other Topics Concern  . Not on file   Social History Narrative  . No narrative on file    ROS: no fevers or chills, productive cough, hemoptysis, dysphasia, odynophagia, melena, hematochezia, dysuria, hematuria, rash, seizure activity, orthopnea, PND, pedal edema, claudication. Remaining systems are negative.  Physical Exam: Well-developed well-nourished in no acute distress.  Skin is warm and dry.  HEENT is normal.  Neck is supple.  Chest is clear to auscultation with normal expansion.  Cardiovascular exam is regular rate and rhythm. 3/6 systolic murmur left sternal border. Abdominal exam nontender or distended. No masses palpated. Extremities show no edema. neuro grossly intact  ECG Sinus rhythm at a rate of 58. First degree AV block. RV conduction delay. Left axis deviation. Septal infarct.

## 2014-02-21 ENCOUNTER — Other Ambulatory Visit: Payer: Self-pay | Admitting: *Deleted

## 2014-02-21 ENCOUNTER — Ambulatory Visit (HOSPITAL_COMMUNITY)
Admission: RE | Admit: 2014-02-21 | Discharge: 2014-02-21 | Disposition: A | Discharge status: Home / Self Care | Payer: Medicare Other | Source: Ambulatory Visit | Attending: Cardiology | Admitting: Cardiology

## 2014-02-21 DIAGNOSIS — I359 Nonrheumatic aortic valve disorder, unspecified: Secondary | ICD-10-CM | POA: Diagnosis not present

## 2014-02-21 DIAGNOSIS — I77810 Thoracic aortic ectasia: Secondary | ICD-10-CM

## 2014-02-21 DIAGNOSIS — I2510 Atherosclerotic heart disease of native coronary artery without angina pectoris: Secondary | ICD-10-CM

## 2014-02-21 NOTE — Progress Notes (Signed)
2D Echocardiogram Complete.  02/21/2014   Naphtali Riede, RDCS

## 2014-02-27 ENCOUNTER — Ambulatory Visit (HOSPITAL_COMMUNITY)
Admission: RE | Admit: 2014-02-27 | Discharge: 2014-02-27 | Disposition: A | Discharge status: Home / Self Care | Payer: Medicare Other | Source: Ambulatory Visit | Attending: Cardiology | Admitting: Cardiology

## 2014-02-27 DIAGNOSIS — I77810 Thoracic aortic ectasia: Secondary | ICD-10-CM | POA: Diagnosis present

## 2014-02-27 DIAGNOSIS — I714 Abdominal aortic aneurysm, without rupture, unspecified: Secondary | ICD-10-CM | POA: Insufficient documentation

## 2014-02-27 MED ORDER — IOHEXOL 350 MG/ML SOLN
100.0000 mL | Freq: Once | INTRAVENOUS | Status: AC | PRN
  Administered 2014-02-27: 100 mL via INTRAVENOUS

## 2014-03-04 ENCOUNTER — Other Ambulatory Visit: Payer: Self-pay

## 2014-03-04 ENCOUNTER — Telehealth: Payer: Self-pay | Admitting: Cardiology

## 2014-03-04 MED ORDER — ENALAPRIL MALEATE 10 MG PO TABS: ORAL_TABLET | ORAL | Status: DC

## 2014-03-04 NOTE — Telephone Encounter (Signed)
Message routed to [address] refill pool

## 2014-03-04 NOTE — Telephone Encounter (Signed)
Patient states that his ex wife refilled his medications in [place], [place] .  He wants to know if we can call new prescriptions to [facility] in [facility] so he will not have to drive to [place].

## 2014-06-06 ENCOUNTER — Other Ambulatory Visit: Payer: Self-pay | Admitting: Cardiology

## 2014-06-06 MED ORDER — ENALAPRIL MALEATE 10 MG PO TABS: ORAL_TABLET | ORAL | Status: DC

## 2014-09-05 ENCOUNTER — Ambulatory Visit: Payer: Self-pay | Admitting: Family Medicine

## 2014-09-17 ENCOUNTER — Ambulatory Visit: Payer: Self-pay | Admitting: Urology

## 2014-10-14 ENCOUNTER — Ambulatory Visit
Admit: 2014-10-14 | Disposition: A | Discharge status: Home / Self Care | Payer: Self-pay | Attending: Radiation Oncology | Admitting: Radiation Oncology

## 2014-11-01 ENCOUNTER — Ambulatory Visit
Admit: 2014-11-01 | Disposition: A | Discharge status: Home / Self Care | Payer: Self-pay | Attending: Radiation Oncology | Admitting: Radiation Oncology

## 2014-11-20 ENCOUNTER — Encounter: Payer: Self-pay | Admitting: *Deleted

## 2014-11-26 ENCOUNTER — Encounter: Payer: Self-pay | Admitting: General Surgery

## 2014-11-26 ENCOUNTER — Ambulatory Visit (INDEPENDENT_AMBULATORY_CARE_PROVIDER_SITE_OTHER): Payer: Medicare Other | Admitting: General Surgery

## 2014-11-26 VITALS — BP 130/80 | HR 82 | Resp 14 | Ht 75.0 in | Wt 237.0 lb

## 2014-11-26 DIAGNOSIS — R1032 Left lower quadrant pain: Secondary | ICD-10-CM

## 2014-11-26 DIAGNOSIS — R1030 Lower abdominal pain, unspecified: Secondary | ICD-10-CM

## 2014-11-26 NOTE — Patient Instructions (Addendum)
The patient is aware to call back for any questions or concerns.  Patient is scheduled for a CT abdomen and pelvis with contrast at [facility] on 11/27/14 at 11:30 am. He will arrive at the medical mall by 11:15 am. He will pick up a prep kit today. He is to bring a list of his medications with him. He will have nothing to eat and only clear liquids to drink 4 hours prior to the exam. Patient is aware of date, time, and instructions.

## 2014-11-26 NOTE — Progress Notes (Signed)
Patient ID: Sergio Dimes., male   DOB: 06/26/1934, 81 y.o.   MRN: [id number]  Chief Complaint  Patient presents with  . Hernia    HPI [name]. is a 81 y.o. male here today for a evaluation of a left inguinal hernia. Patient states that last Monday 11-18-14 he was lifting some wood and had pain left groin then that evening he had burning pain. No knot but his left testicle is swollen. 3 days ago he noticed some burning sensation in the left groin. He feels he has "pressure" more than pain. Bowels are regular. He has had left groin hernia surgery in past. CT scan 2 months ago evaluating his kidneys.   HPI  Past Medical History  Diagnosis Date  . TIA Feb 2012    no residual  . HYPERTENSION   . HYPERCHOLESTEROLEMIA     statin intolerant  . PAF (paroxysmal atrial fibrillation)     briefly noted on Holter monitor  . CAD     s/p CABG in 2007 x 5  . BRADYCARDIA   . Aortic stenosis   . NEPHROLITHIASIS   . Long term (current) use of anticoagulants   . Carotid stenosis     0-39% bilaterally  . Skin cancer   . Prostate cancer   . Stroke   . BPH (benign prostatic hypertrophy)   . Atrial fibrillation     Past Surgical History  Procedure Laterality Date  . Coronary artery bypass graft  2007    x 5  . Lithotripsy  2013  . Total hip arthroplasty Left   . Hernia repair Left 1996    inguinal  . Appendectomy  1999  . Colonoscopy      Family History  Problem Relation Age of Onset  . Heart disease Mother   . Heart failure Mother   . Heart attack Father     Social History History  Substance Use Topics  . Smoking status: Former Research scientist (life sciences)  . Smokeless tobacco: Not on file  . Alcohol Use: No    Allergies  Allergen Reactions  . Metronidazole Hives and Itching  . Procaine Other (See Comments)    Loss of consciousness  . Statins Other (See Comments)    Leg pain    Current Outpatient Prescriptions  Medication Sig Dispense Refill  . aspirin 81 MG  tablet Take 81 mg by mouth daily.      . Cholecalciferol (VITAMIN D-3 PO) Take 1 tablet by mouth daily.    . Coenzyme Q10 (CO Q-10) 100 MG CAPS Take by mouth.    . docusate sodium (COLACE) 100 MG capsule Take 100 mg by mouth daily.    . enalapril (VASOTEC) 10 MG tablet TAKE ONE TABLET BY MOUTH EVERY DAY 90 tablet 1  . Garlic 3557 MG CAPS Take by mouth.    . naproxen sodium (ANAPROX) 220 MG tablet Take 1 tablet by mouth as needed.     . Red Yeast Rice 600 MG TABS Take by mouth.    . tamsulosin (FLOMAX) 0.4 MG CAPS capsule Take 1 capsule by mouth daily.    . vitamin E 400 UNIT capsule Take 400 Units by mouth daily.     No current facility-administered medications for this visit.    Review of Systems Review of Systems  Constitutional: Negative.   Respiratory: Negative.   Cardiovascular: Negative.     Blood pressure 130/80, pulse 82, resp. rate 14, height 6' 3"  (1.905 m), weight 107.502 kg (237  lb).  Physical Exam Physical Exam  Constitutional: He is oriented to person, place, and time. He appears well-developed and well-nourished.  Eyes: Conjunctivae are normal. No scleral icterus.  Neck: Neck supple.  Cardiovascular: Normal rate and regular rhythm.   Murmur heard.  Systolic murmur is present with a grade of 3/6  Pulmonary/Chest: Effort normal and breath sounds normal.  Abdominal: Soft. Normal appearance and bowel sounds are normal. There is no hepatomegaly. There is tenderness in the left lower quadrant. No hernia. Hernia confirmed negative in the right inguinal area and confirmed negative in the left inguinal area.  Tenderness along incision left groin.   Lymphadenopathy:    He has no cervical adenopathy.  Neurological: He is alert and oriented to person, place, and time.  Skin: Skin is warm and dry.    Data Reviewed Ct scan from February 2016 reviewed no hernia noted, he does have diverticulosis.   Assessment    Patient does not have a hernia, possible diverticulitis.     Plan    CT scan with contrast. Will proceed from there.  Patient is scheduled for a CT abdomen and pelvis with contrast at [facility] on 11/27/14 at 11:30 am. He will arrive at the medical mall by 11:15 am. He will pick up a prep kit today. He is to bring a list of his medications with him. He will have nothing to eat and only clear liquids to drink 4 hours prior to the exam. Patient is aware of date, time, and instructions.      PCP:  [name] 11/27/2014, 6:03 AM

## 2014-11-27 ENCOUNTER — Ambulatory Visit
Admit: 2014-11-27 | Disposition: A | Discharge status: Home / Self Care | Payer: Self-pay | Attending: General Surgery | Admitting: General Surgery

## 2014-11-27 ENCOUNTER — Encounter: Payer: Self-pay | Admitting: General Surgery

## 2014-12-01 NOTE — Consult Note (Signed)
Reason for Visit: This 81 year old Male patient presents to the clinic for initial evaluation of  prostate cancer .   Referred by Dr. Elnoria Howard.  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 He was noted on physical examination to have a small nodule at the left apex which was concerning for malignancy. He underwent transrectal ultrasound-guided biopsy showing 3 cores out of 12 positive for mostly Gleason 7 (3+4) adenocarcinoma. Patient does have some urinary symptoms including frequency urgency and nocturia 3. Patient lives in [place] transportation is an issue and he was seen by urology and offered radiation therapy. He does have a 77 ml prostate. He otherwise is doing well and is seen today for radiation oncology consultation.patient had a CT scan of abdomen and pelvis I see no evidence of pelvic adenopathy you are extracapsular extension of his prostate cancer by CT criteria  Past Hx:    Atrial Fibrillation:    Skin Cancer:    Bilateral Renal Cysts:    BPH - Benign Prostatic Hypertrophy:    Kidney Stones:    CVA/Stroke:    HTN:    Prostate Cancer:    Lithotripsy:    CABG (Coronary Artery Bypass Graft):    Hip Replacement - Left:    Appendectomy:   Past, Family and Social History:  Past Medical History positive   Cardiovascular atrial fibrillation; CABG performed; hypertension   Genitourinary kidney stones; bilateral renal cysts,lithotripsy, history of prostatitis    Neurological/Psychiatric CVA   Past Surgical History appendectomy; left hip replacement   Family History noncontributory   Social History positive   Social History Comments 40 pack year smoking history quit smoking years ago no EtOH abuse history   Additional Past Medical and Surgical History seen by himself today   Allergies:   Flagyl: Itching, Rash, Hives  Novocain: Dizzy/Fainting  Home Meds:  Home Medications: Medication Instructions Status  Colace sodium 100 mg oral capsule 1 cap(s) orally once a day Active  enalapril 10 mg oral tablet 1 tab(s) orally once a day Active  Garlic Oil - oral capsule 1 cap(s) orally once a day Active  Red Yeast Rice 600 mg oral capsule 1 cap(s) orally once a day Active  Vitamin D3 1000 intl units oral capsule 1 cap(s) orally once a day Active  vitamin E 400 intl units oral capsule 1 cap(s) orally once a day Active  CoQ10 1 cap(s) orally once a day Active  aspirin 81 mg oral tablet 1 tab(s) orally once a day Active   Review of Systems:  General negative   Performance Status (ECOG) 0   Skin negative   Breast negative   Ophthalmologic negative   ENMT negative   Respiratory and Thorax negative   Cardiovascular negative   Gastrointestinal negative   Genitourinary see HPI   Musculoskeletal negative   Neurological negative   Psychiatric negative   Hematology/Lymphatics negative   Endocrine negative   Allergic/Immunologic negative   Review of Systems   except for  the urinary symptoms as described abovedenies any weight loss, fatigue, weakness, fever, chills or night sweats. Patient denies any loss of vision, blurred vision. Patient denies any ringing  of the ears or hearing loss. No irregular heartbeat. Patient denies heart murmur or history of fainting. Patient denies any chest pain or pain radiating to her upper extremities. Patient denies any shortness of breath, difficulty breathing at night, cough or hemoptysis. Patient  denies any swelling in the lower legs. Patient denies any nausea vomiting, vomiting of blood, or coffee ground material in the vomitus. Patient denies any stomach pain. Patient states has had normal bowel movements no significant constipation or diarrhea. Patient denies any dysuria, hematuria or significant nocturia. Patient denies any problems walking, swelling in the joints or loss of balance. Patient denies any skin changes, loss of hair or loss of weight. Patient denies any excessive worrying or anxiety or significant depression. Patient denies any problems with insomnia. Patient denies excessive thirst, polyuria, polydipsia. Patient denies any swollen glands, patient denies easy bruising or easy bleeding. Patient denies any recent infections, allergies or URI. Patient "s visual fields have not changed significantly in recent time.   Nursing Notes:  Nursing Vital Signs and Chemo Nursing Nursing Notes: *CC Vital Signs Flowsheet:   14-Mar-16 13:08  Temp Temperature 95.8  Pulse Pulse 63  Respirations Respirations 20  SBP SBP 139  DBP DBP 70  Pain Scale (0-10)  0  Current Weight (kg) (kg) 106.8   Physical Exam:  General/Skin/HEENT:  General normal   Skin normal   Eyes normal   ENMT normal   Head and Neck normal   Additional PE well-developed male appears younger than stated age. Lungs are clear to A&P cardiac examination shows regular rate and rhythm abdomen is benign. On rectal exam rectal sphincter tone is good there is some firmness of the right lateral lobe of the prostate extending down to the apex. Sulcus is preserved bilaterally. No other rectal abnormality is identified.   Breasts/Resp/CV/GI/GU:  Respiratory and Thorax normal   Cardiovascular normal   Gastrointestinal normal   Genitourinary normal   MS/Neuro/Psych/Lymph:  Musculoskeletal normal   Neurological normal   Lymphatics normal   Other Results:  Radiology Results: CT:    16-Feb-16 14:20, CT Abdomen  and Pelvis W/WO Contrast  CT Abdomen and Pelvis W/WO Contrast   REASON FOR EXAM:    Bilateral renal mass  COMMENTS:       PROCEDURE: KCT - KCT ABDOMEN/PELVIS W/WO  - Sep 17 2014  2:20PM     CLINICAL DATA:  Indeterminate left renal masses.    EXAM:  CT ABDOMEN AND PELVIS WITHOUT AND WITH CONTRAST    TECHNIQUE:  Multidetector CT imaging of the abdomen and pelvis was performed  following the standard protocol before and following the bolus  administration of intravenous contrast.  CONTRAST:  115 mL Omnipaque 350    COMPARISON:  Renal ultrasound on 09/05/2014    FINDINGS:  Lower chest:  Unremarkable.    Hepatobiliary: No masses or other significant abnormality  identified. Gallbladder is unremarkable.    Pancreas: No evidence of mass, inflammatory changes, or other  significant abnormality.    Spleen:  Withinnormal limits in size and appearance.  Adrenal Glands:  No masses identified.    Kidneys/Urinary Tract: Tiny less than 5 mm renal calculi are noted  bilaterally however there is no evidence of ureteral calculi or  hydronephrosis.    Several small simple Bosniak category 1 right renal cysts are noted.  A 2.8 cm Bosniak category 2 hyperdense cyst is seen in the posterior  interpolar region of the left kidney. A 7.6 cm Bosniak category 2  cyst or cell seen in the lateral midpole the left kidney which shows  several fine internal septations with fine calcification, but no  evidence of or contrast enhancement or solid component. No Bosniak  category 3 or 4 cystic lesions or solid renal masses are identified.    Stomach/Bowel/Peritoneum: No evidence of wall thickening, mass, or  obstruction. Mild left colonic diverticulosis is demonstrated,  without evidence of diverticulitis.    Vascular/Lymphatic: No pathologically enlarged lymph nodes  identified. No other significant abnormality noted.    Reproductive:  Mild to moderately enlarged prostate gland is noted.    Other:  A left hip prosthesis results in beam hardening artifact  through the inferior aspect of the pelvis.    Musculoskeletal:  No suspicious bone lesions identified.     IMPRESSION:  Bilateral benign-appearing Bosniak category 1 and 2 renal cysts. No  radiographic evidence of renal neoplasm.    Bilateral nonobstructive nephrolithiasis. No evidence of ureteral  calculi or hydronephrosis.    Mild to moderately enlarged prostate.      Electronically Signed    By: Earle Gell M.D.    On: 09/17/2014 16:21         Verified By: Marlaine Hind, M.D.,   Relevent Results:   Relevant Scans and Labs CT scan abdomen and pelvis reviewed   Assessment and Plan: Impression:   stage II a adenocarcinoma the prostate Gleason score of 7 (33+72) in 81 year old male with rising PSA. Plan:   at this time I've gone over treatment options with the patient excluding surgery based on his age and overall cardiac condition. I feel patient would benefit from either I-125 interstitial implant or external beam IM RT treatment. Risks and benefits of both treatment options were discussed. On one hand he is concerned about the drive every day from [place] which would be adding 2 hours to his treatment time. I also am concerned about his prostate volume close to 80 ml which would be problematic for I-125 interstitial implant. I've agreed to start him on Lupron therapy hoping to decrease his prostate volume and set up his volume study in about a month and a half's time to see if we can still go ahead with seed implantation. If not we would fall back on external beam IM RT treatment. Patient is comfortable when decision making of also started him on Flomax today for some of his lower tract symptoms. Coordination with urology for volume study will be arranged.  I would like to take this opportunity for allowing me to participate in the care of your patient..  Fax to Physician:  Physicians To Recieve Fax: Dion Body,  MD - [phone] Collier Flowers - [phone].  Electronic Signatures: Quadry Kampa, Roda Shutters (MD)  (Signed 14-Mar-16 15:07)  Authored: HPI, Diagnosis, Past Hx, PFSH, Allergies, Home Meds, ROS, Nursing Notes, Physical Exam, Other Results, Relevent Results, Encounter Assessment and Plan, Fax to Physician   Last Updated: 14-Mar-16 15:07 by Armstead Peaks (MD)

## 2014-12-02 ENCOUNTER — Ambulatory Visit: Admission: RE | Admit: 2014-12-02 | Payer: Medicare Other | Source: Ambulatory Visit | Admitting: Radiation Oncology

## 2014-12-09 ENCOUNTER — Ambulatory Visit: Admission: RE | Admit: 2014-12-09 | Payer: Medicare Other | Source: Ambulatory Visit | Admitting: Urology

## 2014-12-09 ENCOUNTER — Encounter: Admission: RE | Payer: Self-pay | Source: Ambulatory Visit

## 2014-12-09 ENCOUNTER — Ambulatory Visit
Admission: RE | Admit: 2014-12-09 | Discharge: 2014-12-09 | Disposition: A | Discharge status: Home / Self Care | Payer: Medicare Other | Source: Ambulatory Visit | Attending: Radiation Oncology | Admitting: Radiation Oncology

## 2014-12-09 ENCOUNTER — Encounter: Payer: Self-pay | Admitting: Radiation Oncology

## 2014-12-09 VITALS — BP 194/88 | HR 56 | Temp 94.1°F | Resp 20 | Wt 236.1 lb

## 2014-12-09 DIAGNOSIS — C61 Malignant neoplasm of prostate: Secondary | ICD-10-CM

## 2014-12-09 DIAGNOSIS — Z510 Encounter for antineoplastic radiation therapy: Secondary | ICD-10-CM | POA: Insufficient documentation

## 2014-12-09 SURGERY — ULTRASOUND, PROSTATE, FOR VOLUME DETERMINATION
Anesthesia: Choice

## 2014-12-09 NOTE — Op Note (Signed)
Radiation Oncology Procedure Note  Name: [name].  MRN: [id number]  Date:   12/09/2014     DOB: Oct 07, 1933   This 81 y.o. male patient presents to the clinic for initial evaluation of Volume study for prostate cancer treatment.  CHIEF COMPLAINT:  Chief Complaint  Patient presents with  . Prostate Cancer    Volume study for I-125 prostate seed implant    DIAGNOSIS: The encounter diagnosis was Prostate cancer.  HPI: patient is a 81 year old male who was noticed to have a elevated PSA up to 9.7 recently. He was noted on physical examination to have a small nodule at the left apex which was concerning for malignancy. He underwent transrectal ultrasound-guided biopsy showing 3 cores out of 12 positive for mostly Gleason 7 (3+4) adenocarcinoma. Patient does have some urinary symptoms including frequency urgency and nocturia 3. Patient lives in [place] transportation is an issue and he was seen by urology and offered radiation therapy. He does have a 77 ml prostate. He otherwise is doing well and is seen today for radiation oncology consultation.patient had a CT scan of abdomen and pelvis I see no evidence of pelvic adenopathy you are extracapsular extension of his prostate cancer by CT criteria Patient was taken to the operating today for volume study to determine seed placement for I-125 interstitial implant.  PHYSICAL EXAM: Well-developed well-nourished patient in NAD. HEENT reveals PERLA, EOMI, discs not visualized.  Oral cavity is clear. No oral mucosal lesions are identified. Neck is clear without evidence of cervical or supraclavicular adenopathy. Lungs are clear to A&P. Cardiac examination is essentially unremarkable with regular rate and rhythm without murmur rub or thrill. Abdomen is benign with no organomegaly or masses noted. Motor sensory and DTR levels are equal and symmetric in the upper and lower extremities. Cranial nerves II through XII are grossly intact.  Proprioception is intact. No peripheral adenopathy or edema is identified. No motor or sensory levels are noted. Crude visual fields are within normal range.   BP Readings from Last 1 Encounters:  11/26/14 130/80   Pulse Readings from Last 1 Encounters:  11/26/14 82   Wt Readings from Last 1 Encounters:  11/26/14 237 lb (107.502 kg)     PROCEDURE: Patient was taken to the cystoscopy suite in the OR. Patient was placed in the low lithotomy position. Foley catheter was placed. Trans-rectal ultrasound probe was inserted into the rectum and prostate seminal vesicles were visualized as well as bladder base. stepping images were performed on a 5 mm increments. Images will be placed in BrachyVision treatment planning system to determine seed placement coordinates for eventual I-125 interstitial implant. Images will be reviewed with the physics and dosimetry staff for final quality approval. I personally was present for the volume study and assisted in delineation of contour volumes.  At the end of the procedure Foley catheter was removed, rectal ultrasound probe was removed. Patient tolerated his procedures extremely well with no side effects or complaints. Patient has given appointment for interstitial implant date. Consent was signed today as well as history and physical performed in preparation for his outpatient surgical implant.    PLAN: Patient tolerated the volume study well without side effect or complaint. BrachyVision treatment planning we be used now to determine seed placement positioning. History and physical preoperatively was performed also today. Consent was signed.  I would like to take this opportunity for allowing me to participate in the care of your patient.Armstead Peaks., MD

## 2014-12-09 NOTE — Op Note (Signed)
Pre-op Prostate adenocarcinoma  Post-op Same Procedure: volume study for brachy-therapy  Anesthesia  None  With the patient in the supine lithotomy position and after an appropriate time out a 20 french foley catheter is introduced into the patients bladder without difficulty.  30 ml of sterile solution is introduced into the balloon.  Then the ultrasonic probe is introduced into the patients rectum.  Measurements are done at 36mm increments.  Each incremental study is printed for future seed implant planing.  Foley is then removed and the ultrasonic probe removed and patient sent to the cancer center for further review and planning

## 2014-12-09 NOTE — H&P (Signed)
Radiation Oncology NEW PATIENT EVALUATION  Name: [name].  MRN: [id number]  Date:   12/09/2014     DOB: 1934-02-12   This 81 y.o. male patient presents to the clinic for treatment planning for Prostate seed implant.  REFERRING PHYSICIAN: Dion Body, MD  CHIEF COMPLAINT: No chief complaint on file.   DIAGNOSIS: Stage IIa (T2 N0 M0) adenocarcinoma the prostate Gleason 7 (3+4) presenting with a PSA of 9.7  PREVIOUS INVESTIGATIONS:  Referral Report reviewed  HPI: patient is a 81 year old male who was noticed to have a elevated PSA up to 9.7 recently. He was noted on physical examination to have a small nodule at the left apex which was concerning for malignancy. He underwent transrectal ultrasound-guided biopsy showing 3 cores out of 12 positive for mostly Gleason 7 (3+4) adenocarcinoma. Patient does have some urinary symptoms including frequency urgency and nocturia 3. Patient lives in [place] transportation is an issue and he was seen by urology and offered radiation therapy. He does have a 77 ml prostate. We started the patient on Lupron hope of shrinking his prostate somewhat. He is seen today for volume study to determine seed placement for I-125 interstitial implant. He is doing well otherwise. No significant lower urinary tract symptoms.  PLANNED TREATMENT REGIMEN: I-125 interstitial implant  PAST MEDICAL HISTORY:  has a past medical history of TIA (Feb 2012); HYPERTENSION; HYPERCHOLESTEROLEMIA; PAF (paroxysmal atrial fibrillation); CAD; BRADYCARDIA; Aortic stenosis; NEPHROLITHIASIS; Long term (current) use of anticoagulants; Carotid stenosis; Skin cancer; Prostate cancer; Stroke; BPH (benign prostatic hypertrophy); and Atrial fibrillation.    PAST SURGICAL HISTORY:  Past Surgical History  Procedure Laterality Date  . Coronary artery bypass graft  2007    x 5  . Lithotripsy  2013  . Total hip arthroplasty Left   . Hernia repair Left 1996    inguinal  .  Appendectomy  1999  . Colonoscopy      FAMILY HISTORY: family history includes Heart attack in his father; Heart disease in his mother; Heart failure in his mother.  SOCIAL HISTORY:  reports that he has quit smoking. He does not have any smokeless tobacco history on file. He reports that he does not drink alcohol or use illicit drugs.  ALLERGIES: Metronidazole; Procaine; and Statins  MEDICATIONS:  Current Outpatient Prescriptions  Medication Sig Dispense Refill  . aspirin 81 MG tablet Take 81 mg by mouth daily.      . Cholecalciferol (VITAMIN D-3 PO) Take 1 tablet by mouth daily.    . Coenzyme Q10 (CO Q-10) 100 MG CAPS Take by mouth.    . docusate sodium (COLACE) 100 MG capsule Take 100 mg by mouth daily.    . enalapril (VASOTEC) 10 MG tablet TAKE ONE TABLET BY MOUTH EVERY DAY 90 tablet 1  . Garlic 0762 MG CAPS Take by mouth.    . naproxen sodium (ANAPROX) 220 MG tablet Take 1 tablet by mouth as needed.     . Red Yeast Rice 600 MG TABS Take by mouth.    . tamsulosin (FLOMAX) 0.4 MG CAPS capsule Take 1 capsule by mouth daily.    . vitamin E 400 UNIT capsule Take 400 Units by mouth daily.     No current facility-administered medications for this encounter.    ECOG PERFORMANCE STATUS:  0 - Asymptomatic  REVIEW OF SYSTEMS:  Patient denies any weight loss, fatigue, weakness, fever, chills or night sweats. Patient denies any loss of vision, blurred vision. Patient denies any ringing  of the ears or hearing loss. No irregular heartbeat. Patient denies heart murmur or history of fainting. Patient denies any chest pain or pain radiating to her upper extremities. Patient denies any shortness of breath, difficulty breathing at night, cough or hemoptysis. Patient denies any swelling in the lower legs. Patient denies any nausea vomiting, vomiting of blood, or coffee ground material in the vomitus. Patient denies any stomach pain. Patient states has had normal bowel movements no significant  constipation or diarrhea. Patient denies any dysuria, hematuria or significant nocturia. Patient denies any problems walking, swelling in the joints or loss of balance. Patient denies any skin changes, loss of hair or loss of weight. Patient denies any excessive worrying or anxiety or significant depression. Patient denies any problems with insomnia. Patient denies excessive thirst, polyuria, polydipsia. Patient denies any swollen glands, patient denies easy bruising or easy bleeding. Patient denies any recent infections, allergies or URI. Patient "s visual fields have not changed significantly in recent time.    PHYSICAL EXAM: There were no vitals taken for this visit. Well-developed well-nourished male in NAD.Well-developed well-nourished patient in NAD. HEENT reveals PERLA, EOMI, discs not visualized.  Oral cavity is clear. No oral mucosal lesions are identified. Neck is clear without evidence of cervical or supraclavicular adenopathy. Lungs are clear to A&P. Cardiac examination is essentially unremarkable with regular rate and rhythm without murmur rub or thrill. Abdomen is benign with no organomegaly or masses noted. Motor sensory and DTR levels are equal and symmetric in the upper and lower extremities. Cranial nerves II through XII are grossly intact. Proprioception is intact. No peripheral adenopathy or edema is identified. No motor or sensory levels are noted. Crude visual fields are within normal range.   LABORATORY DATA:  No results found for this or any previous visit (from the past 72 hour(s)).   RADIOLOGY RESULTS: No results found.  IMPRESSION: At this time patient will proceed with I-125 interstitial implant. He will have a volume study today performed with Dr. Elnoria Howard. Informed consent was signed risks and benefits of treatments including increasing lower urinary tract symptoms, diarrhea, fatigue, alteration of blood counts and radiation safety precautions for I-125 interstitial implant  all were were reviewed in detail with the patient. He seems to comprehend my treatment plan well.  I would like to take this opportunity for allowing me to participate in the care of your patient.Armstead Peaks., MD

## 2014-12-18 DIAGNOSIS — Z510 Encounter for antineoplastic radiation therapy: Secondary | ICD-10-CM | POA: Diagnosis not present

## 2014-12-23 ENCOUNTER — Other Ambulatory Visit: Payer: Medicare Other

## 2014-12-26 ENCOUNTER — Encounter
Admission: RE | Admit: 2014-12-26 | Discharge: 2014-12-26 | Disposition: A | Discharge status: Home / Self Care | Payer: Medicare Other | Source: Ambulatory Visit | Attending: Urology | Admitting: Urology

## 2014-12-26 DIAGNOSIS — C61 Malignant neoplasm of prostate: Secondary | ICD-10-CM | POA: Diagnosis not present

## 2014-12-26 DIAGNOSIS — Z01810 Encounter for preprocedural cardiovascular examination: Secondary | ICD-10-CM | POA: Insufficient documentation

## 2014-12-26 DIAGNOSIS — Z01812 Encounter for preprocedural laboratory examination: Secondary | ICD-10-CM | POA: Diagnosis present

## 2014-12-26 HISTORY — DX: Adverse effect of unspecified anesthetic, initial encounter: T41.45XA

## 2014-12-26 HISTORY — DX: Other complications of anesthesia, initial encounter: T88.59XA

## 2014-12-26 HISTORY — DX: Transient cerebral ischemic attack, unspecified: G45.9

## 2014-12-26 LAB — CBC
HCT: 42 % (ref 40.0–52.0)
Hemoglobin: 14.4 g/dL (ref 13.0–18.0)
MCH: 30.4 pg (ref 26.0–34.0)
MCHC: 34.2 g/dL (ref 32.0–36.0)
MCV: 88.9 fL (ref 80.0–100.0)
PLATELETS: 208 10*3/uL (ref 150–440)
RBC: 4.72 MIL/uL (ref 4.40–5.90)
RDW: 13.2 % (ref 11.5–14.5)
WBC: 6.1 10*3/uL (ref 3.8–10.6)

## 2014-12-26 LAB — DIFFERENTIAL
Basophils Absolute: 0 10*3/uL (ref 0–0.1)
Basophils Relative: 1 %
Eosinophils Absolute: 0.1 10*3/uL (ref 0–0.7)
Eosinophils Relative: 2 %
LYMPHS ABS: 2.1 10*3/uL (ref 1.0–3.6)
Lymphocytes Relative: 34 %
MONOS PCT: 9 %
Monocytes Absolute: 0.6 10*3/uL (ref 0.2–1.0)
NEUTROS ABS: 3.3 10*3/uL (ref 1.4–6.5)
Neutrophils Relative %: 54 %

## 2014-12-26 NOTE — Patient Instructions (Signed)
  Your procedure is scheduled on: January 06, 2015 (Monday)Report to Same Day Surgery. To find out your arrival time please call [phone] between 1PM - 3PM on Dec 03, 2014 (Friday).  Remember: Instructions that are not followed completely may result in serious medical risk, up to and including death, or upon the discretion of your surgeon and anesthesiologist your surgery may need to be rescheduled.    ____ 1. Do not eat food or drink liquids after midnight. No gum chewing or hard candies.     ____ 2. No Alcohol for 24 hours before or after surgery.   ____ 3. Bring all medications with you on the day of surgery if instructed.    ____ 4. Notify your doctor if there is any change in your medical condition     (cold, fever, infections).     Do not wear jewelry, make-up, hairpins, clips or nail polish.  Do not wear lotions, powders, or perfumes. You may wear deodorant.  Do not shave 48 hours prior to surgery. Men may shave face and neck.  Do not bring valuables to the hospital.    [facility] is not responsible for any belongings or valuables.               Contacts, dentures or bridgework may not be worn into surgery.  Leave your suitcase in the car. After surgery it may be brought to your room.  For patients admitted to the hospital, discharge time is determined by your                treatment team.   Patients discharged the day of surgery will not be allowed to drive home.   Please read over the following fact sheets that you were given:     ____ Take these medicines the morning of surgery with A SIP OF WATER:    1. Enalapril  2.   3.   4.  5.  6.  __X_ Fleet Enema (as directed) 2 hours prior to surgery   ____ Use CHG Soap as directed  ____ Use inhalers on the day of surgery  ____ Stop metformin 2 days prior to surgery    ____ Take 1/2 of usual insulin dose the night before surgery and none on the morning of surgery.   __X__ Stop Coumadin/Plavix/aspirin on   __X_  Stop Anti-inflammatories on ( Naproxen) 7-10 days prior to surgery  __X__ Stop supplements until after surgery. (SKS-13, Garlic. Red Yeast Rice,  Vitamin E  ____ Bring C-Pap to the hospital.

## 2015-01-03 ENCOUNTER — Telehealth: Payer: Self-pay | Admitting: Urology

## 2015-01-03 NOTE — Telephone Encounter (Signed)
Debbie from same day surgery called about pt who is having surgery on Monday @ 7am. She is needing a HNP for him. Dr Donella Stade is attending the surgery, but Dr Elnoria Howard is the lead. Was unable to understand the phone # which she was calling from. 01/03/15 maf

## 2015-01-03 NOTE — Telephone Encounter (Signed)
H&P will be taken care of the day of surgery. Cw,lpn

## 2015-01-06 ENCOUNTER — Encounter
Admission: RE | Disposition: A | Discharge status: Home / Self Care | Payer: Medicare Other | Source: Ambulatory Visit | Attending: Urology

## 2015-01-06 ENCOUNTER — Ambulatory Visit: Payer: Medicare Other | Admitting: Anesthesiology

## 2015-01-06 ENCOUNTER — Ambulatory Visit
Admission: RE | Admit: 2015-01-06 | Discharge: 2015-01-06 | Disposition: A | Discharge status: Home / Self Care | Payer: Medicare Other | Source: Ambulatory Visit | Attending: Urology | Admitting: Urology

## 2015-01-06 ENCOUNTER — Encounter: Payer: Self-pay | Admitting: *Deleted

## 2015-01-06 ENCOUNTER — Encounter: Payer: Self-pay | Admitting: Radiation Oncology

## 2015-01-06 ENCOUNTER — Other Ambulatory Visit: Payer: Self-pay | Admitting: Radiation Oncology

## 2015-01-06 DIAGNOSIS — C61 Malignant neoplasm of prostate: Secondary | ICD-10-CM | POA: Diagnosis not present

## 2015-01-06 DIAGNOSIS — E780 Pure hypercholesterolemia: Secondary | ICD-10-CM | POA: Diagnosis not present

## 2015-01-06 DIAGNOSIS — Z8673 Personal history of transient ischemic attack (TIA), and cerebral infarction without residual deficits: Secondary | ICD-10-CM | POA: Insufficient documentation

## 2015-01-06 DIAGNOSIS — Z791 Long term (current) use of non-steroidal anti-inflammatories (NSAID): Secondary | ICD-10-CM | POA: Diagnosis not present

## 2015-01-06 DIAGNOSIS — Z888 Allergy status to other drugs, medicaments and biological substances status: Secondary | ICD-10-CM | POA: Diagnosis not present

## 2015-01-06 DIAGNOSIS — Z79899 Other long term (current) drug therapy: Secondary | ICD-10-CM | POA: Diagnosis not present

## 2015-01-06 DIAGNOSIS — I2510 Atherosclerotic heart disease of native coronary artery without angina pectoris: Secondary | ICD-10-CM | POA: Diagnosis not present

## 2015-01-06 DIAGNOSIS — Z951 Presence of aortocoronary bypass graft: Secondary | ICD-10-CM | POA: Insufficient documentation

## 2015-01-06 DIAGNOSIS — Z7982 Long term (current) use of aspirin: Secondary | ICD-10-CM | POA: Diagnosis not present

## 2015-01-06 DIAGNOSIS — Z87891 Personal history of nicotine dependence: Secondary | ICD-10-CM | POA: Insufficient documentation

## 2015-01-06 DIAGNOSIS — R972 Elevated prostate specific antigen [PSA]: Secondary | ICD-10-CM | POA: Diagnosis not present

## 2015-01-06 DIAGNOSIS — Z87442 Personal history of urinary calculi: Secondary | ICD-10-CM | POA: Diagnosis not present

## 2015-01-06 DIAGNOSIS — I480 Paroxysmal atrial fibrillation: Secondary | ICD-10-CM | POA: Diagnosis not present

## 2015-01-06 DIAGNOSIS — Z85828 Personal history of other malignant neoplasm of skin: Secondary | ICD-10-CM | POA: Insufficient documentation

## 2015-01-06 DIAGNOSIS — I10 Essential (primary) hypertension: Secondary | ICD-10-CM | POA: Insufficient documentation

## 2015-01-06 DIAGNOSIS — Z8249 Family history of ischemic heart disease and other diseases of the circulatory system: Secondary | ICD-10-CM | POA: Diagnosis not present

## 2015-01-06 DIAGNOSIS — Z9889 Other specified postprocedural states: Secondary | ICD-10-CM | POA: Diagnosis not present

## 2015-01-06 HISTORY — PX: RADIOACTIVE SEED IMPLANT: SHX5150

## 2015-01-06 SURGERY — INSERTION, RADIATION SOURCE, PROSTATE
Anesthesia: General | Wound class: Clean Contaminated

## 2015-01-06 MED ORDER — LIDOCAINE HCL (CARDIAC) 20 MG/ML IV SOLN
INTRAVENOUS | Status: DC | PRN
  Administered 2015-01-06: 30 mg via INTRAVENOUS

## 2015-01-06 MED ORDER — PROPOFOL 10 MG/ML IV BOLUS
INTRAVENOUS | Status: DC | PRN
Start: 2015-01-06 — End: 2015-01-06
  Administered 2015-01-06: 150 mg via INTRAVENOUS

## 2015-01-06 MED ORDER — EPHEDRINE SULFATE 50 MG/ML IJ SOLN
INTRAMUSCULAR | Status: DC | PRN
  Administered 2015-01-06 (×4): 10 mg via INTRAVENOUS

## 2015-01-06 MED ORDER — MIDAZOLAM HCL 2 MG/2ML IJ SOLN
INTRAMUSCULAR | Status: DC | PRN
  Administered 2015-01-06: 2 mg via INTRAVENOUS

## 2015-01-06 MED ORDER — FENTANYL CITRATE (PF) 100 MCG/2ML IJ SOLN
INTRAMUSCULAR | Status: DC | PRN
  Administered 2015-01-06: 100 ug via INTRAVENOUS

## 2015-01-06 MED ORDER — FAMOTIDINE 20 MG PO TABS
20.0000 mg | ORAL_TABLET | Freq: Once | ORAL | Status: AC
  Administered 2015-01-06: 20 mg via ORAL

## 2015-01-06 MED ORDER — ONDANSETRON HCL 4 MG/2ML IJ SOLN: 4.0000 mg | Freq: Once | INTRAMUSCULAR | Status: DC | PRN

## 2015-01-06 MED ORDER — HYDROCODONE-ACETAMINOPHEN 5-325 MG PO TABS: 1.0000 | ORAL_TABLET | Freq: Four times a day (QID) | ORAL | Status: DC | PRN

## 2015-01-06 MED ORDER — CEFAZOLIN SODIUM 1-5 GM-% IV SOLN
1.0000 g | Freq: Once | INTRAVENOUS | Status: AC
  Administered 2015-01-06: 1 g via INTRAVENOUS

## 2015-01-06 MED ORDER — LACTATED RINGERS IV SOLN
INTRAVENOUS | Status: DC
  Administered 2015-01-06 (×3): via INTRAVENOUS

## 2015-01-06 MED ORDER — FENTANYL CITRATE (PF) 100 MCG/2ML IJ SOLN: 25.0000 ug | INTRAMUSCULAR | Status: DC | PRN

## 2015-01-06 MED ORDER — ONDANSETRON HCL 4 MG/2ML IJ SOLN
INTRAMUSCULAR | Status: DC | PRN
  Administered 2015-01-06: 4 mg via INTRAVENOUS

## 2015-01-06 MED ORDER — CIPROFLOXACIN HCL 500 MG PO TABS: 500.0000 mg | ORAL_TABLET | Freq: Two times a day (BID) | ORAL | Status: DC

## 2015-01-06 SURGICAL SUPPLY — 31 items
BAG URO DRAIN 2000ML W/SPOUT (MISCELLANEOUS) ×3 IMPLANT
BLADE CLIPPER SURG (BLADE) ×3 IMPLANT
CATH FOL 2WAY LX 16X5 (CATHETERS) ×3 IMPLANT
CATH ROBINSON RED A/P 16FR (CATHETERS) ×3 IMPLANT
DRAPE INCISE 23X17 IOBAN STRL (DRAPES) ×2
DRAPE INCISE 23X17 STRL (DRAPES) ×1 IMPLANT
DRAPE INCISE IOBAN 23X17 STRL (DRAPES) ×1 IMPLANT
DRAPE TABLE BACK 80X90 (DRAPES) ×3 IMPLANT
DRAPE UNDER BUTTOCK W/FLU (DRAPES) ×3 IMPLANT
GLOVE BIO SURGEON STRL SZ 6.5 (GLOVE) ×4 IMPLANT
GLOVE BIO SURGEON STRL SZ7 (GLOVE) ×6 IMPLANT
GLOVE BIO SURGEON STRL SZ7.5 (GLOVE) ×6 IMPLANT
GLOVE BIO SURGEONS STRL SZ 6.5 (GLOVE) ×2
GOWN STRL REUS W/ TWL LRG LVL3 (GOWN DISPOSABLE) ×2 IMPLANT
GOWN STRL REUS W/ TWL XL LVL3 (GOWN DISPOSABLE) ×1 IMPLANT
GOWN STRL REUS W/TWL LRG LVL3 (GOWN DISPOSABLE) ×6
GOWN STRL REUS W/TWL XL LVL3 (GOWN DISPOSABLE) ×3
GRADUATE 1200CC STRL 31836 (MISCELLANEOUS) ×3 IMPLANT
IV NS 1000ML (IV SOLUTION) ×3
IV NS 1000ML BAXH (IV SOLUTION) ×1 IMPLANT
JELLY LUB 2OZ STRL (MISCELLANEOUS) ×2
JELLY LUBE 2OZ STRL (MISCELLANEOUS) ×1 IMPLANT
KIT RM TURNOVER CYSTO AR (KITS) ×3 IMPLANT
PACK CYSTO AR (MISCELLANEOUS) ×3 IMPLANT
PAD PREP 24X41 OB/GYN DISP (PERSONAL CARE ITEMS) ×3 IMPLANT
SET CYSTO W/LG BORE CLAMP LF (SET/KITS/TRAYS/PACK) ×3 IMPLANT
SOL PREP PVP 2OZ (MISCELLANEOUS) ×3
SOLUTION PREP PVP 2OZ (MISCELLANEOUS) ×1 IMPLANT
SYRINGE IRR TOOMEY STRL 70CC (SYRINGE) ×3 IMPLANT
WATER STERILE IRR 1000ML POUR (IV SOLUTION) ×3 IMPLANT
WATER STERILE IRR 3000ML UROMA (IV SOLUTION) ×3 IMPLANT

## 2015-01-06 NOTE — Brief Op Note (Signed)
Radiation Oncology Procedure Note  Name: [name].  MRN: [id number]  Date:   01/06/2015     DOB: 02/18/1934   This 81 y.o. male patient presents to the OR for prostate I-125 interstitial implant  CHIEF COMPLAINT: Prostate cancer stage IIa (T2 N0 M0) adenocarcinoma of the prostate presenting with a Gleason score of 7 (3+4) and a PSA of 9.7  DIAGNOSIS: Prostate cancer [id number]:MVHQION is a 81 year old male who was noticed to have a elevated PSA up to 9.7 recently. He was noted on physical examination to have a small nodule at the left apex which was concerning for malignancy. He underwent transrectal ultrasound-guided biopsy showing 3 cores out of 12 positive for mostly Gleason 7 (3+4) adenocarcinoma. Patient does have some urinary symptoms including frequency urgency and nocturia 3. Patient lives in [place] transportation is an issue and he was seen by urology and offered radiation therapy. Patient opted for I-125 interstitial implant underwent volume study for 70 cc prostate. Taken to the OR for I-125 interstitial implant  PHYSICAL EXAM: Well-developed well-nourished patient in NAD. HEENT reveals PERLA, EOMI, discs not visualized.  Oral cavity is clear. No oral mucosal lesions are identified. Neck is clear without evidence of cervical or supraclavicular adenopathy. Lungs are clear to A&P. Cardiac examination is essentially unremarkable with regular rate and rhythm without murmur rub or thrill. Abdomen is benign with no organomegaly or masses noted. Motor sensory and DTR levels are equal and symmetric in the upper and lower extremities. Cranial nerves II through XII are grossly intact. Proprioception is intact. No peripheral adenopathy or edema is identified. No motor or sensory levels are noted. Crude visual fields are within normal range.   BP Readings from Last 1 Encounters:  01/06/15 129/69   Pulse Readings from Last 1 Encounters:  01/06/15 78   Wt Readings from Last 1 Encounters:   01/06/15 235 lb (106.595 kg)     PROCEDURE: patient was taken to the operating room and general anesthesia was administered. Legs were immobilized in stirrups and patient was positioned in the exact same proportions as original volume study. Patient was prepped and Foley catheter was placed. Ultrasound guidance identified the prostate and recreated the original set up as per treatment planning volume study. 21 needles were placed under ultrasound guidance with PVCs delivered to the prostate volume. After completion of procedure cystoscopy was performed by urology and no evidence of seeds in the bladder were noted. Patient tolerated the procedure extremely well. Initial plain film as doublecheck identified 98 seeds in the prostate. Patient has followup appointment in one month for CT scan for quality assurance will be performed.   PLAN: Patient will be discharged by urology and has follow-up appointment in our office in one month for CT quality assurance filming for seed placement. Patient understands the risks and benefits of treatment at this point and radiation safety precautions over the next 2 months. Will follow-up with urology as needed.  I would like to take this opportunity for allowing me to participate in the care of your patient.Armstead Peaks., MD

## 2015-01-06 NOTE — Interval H&P Note (Signed)
History and Physical Interval Note:  01/06/2015 7:27 AM  [name].  has presented today for surgery, with the diagnosis of ELEVATED PSA  The various methods of treatment have been discussed with the patient and family. After consideration of risks, benefits and other options for treatment, the patient has consented to  Procedure(s): RADIOACTIVE SEED IMPLANT/BRACHYTHERAPY IMPLANT (N/A) as a surgical intervention .  The patient's history has been reviewed, patient examined, no change in status, stable for surgery.  I have reviewed the patient's chart and labs.  Questions were answered to the patient's satisfaction.     Hollice Espy

## 2015-01-06 NOTE — Op Note (Signed)
Preoperative diagnosis: Adenocarcinoma of the prostate   Postoperative diagnosis: Same   Procedure: I-125 prostate seed implantation, cystoscopy  Surgeon: Hollice Espy M.D. , Lavena Stanford, M.D.   Anesthesia: General  Drains: none  Complications: none  Indications: prostate cancer  Procedure: Patient was brought to operating suite and placement table in the supine position. At this time, a universal timeout protocol was performed, all team members were identified, Venodyne boots are placed, and he was administered IV Ancef in the preoperative period. He was placed in lithotomy position and prepped and draped in usual manner. Radiation oncology department placed a transrectal ultrasound probe anchoring stand/ grid and aligned with previous imaging from the volume study. Foley catheter was inserted without difficulty.  All needle passage was done with real-time transrectal ultrasound guidance in both the transverse and sagittal plains in order to achieve the desired preplanned position. A total of 21 needles were placed.  98 active seeds were implanted. The Foley catheter was removed and a rigid cystoscopy failed to show any seeds outside the prostate without evidence of trauma to the urethral, prostatic fossa, or bladder.  The bladder was drained.  A fluoroscopic image was then obtained showing excellent distrubution of the brachytherapy seeds.  Each seed was counted and counts were correct.    The patient was then repositioned in the supine position, reversed from anesthesia, and taken to the PACU in stable condition.

## 2015-01-06 NOTE — Anesthesia Preprocedure Evaluation (Signed)
Anesthesia Evaluation  Patient identified by MRN, date of birth, ID band Patient awake    Reviewed: Allergy & Precautions, NPO status , Patient's Chart, lab work & pertinent test results  Airway Mallampati: II  TM Distance: >3 FB Neck ROM: Full    Dental  (+) Edentulous Upper, Edentulous Lower   Pulmonary former smoker,  breath sounds clear to auscultation  Pulmonary exam normal       Cardiovascular hypertension, + CAD and + Peripheral Vascular Disease Normal cardiovascular exam+ dysrhythmias Atrial Fibrillation Rhythm:Regular Rate:Normal  Aortic stenosis   Neuro/Psych TIACVA negative psych ROS   GI/Hepatic negative GI ROS, Neg liver ROS,   Endo/Other  negative endocrine ROS  Renal/GU Renal diseaseHx of Kidney stones  negative genitourinary   Musculoskeletal negative musculoskeletal ROS (+)   Abdominal Normal abdominal exam  (+)   Peds negative pediatric ROS (+)  Hematology negative hematology ROS (+)   Anesthesia Other Findings   Reproductive/Obstetrics                             Anesthesia Physical Anesthesia Plan  ASA: III  Anesthesia Plan: General   Post-op Pain Management:    Induction: Intravenous  Airway Management Planned: LMA  Additional Equipment:   Intra-op Plan:   Post-operative Plan: Extubation in OR  Informed Consent: I have reviewed the patients History and Physical, chart, labs and discussed the procedure including the risks, benefits and alternatives for the proposed anesthesia with the patient or authorized representative who has indicated his/her understanding and acceptance.   Dental advisory given  Plan Discussed with: CRNA and Surgeon  Anesthesia Plan Comments:         Anesthesia Quick Evaluation

## 2015-01-06 NOTE — H&P (Signed)
See paper H&P dated 12/09/14

## 2015-01-06 NOTE — Brief Op Note (Signed)
01/06/2015  8:50 AM  PATIENT:  [name].  81 y.o. male  PRE-OPERATIVE DIAGNOSIS:  ELEVATED PSA, prostate cacner  POST-OPERATIVE DIAGNOSIS:  ELEVATED PSA, prostate cancer  PROCEDURE:  Procedure(s): RADIOACTIVE SEED IMPLANT/BRACHYTHERAPY IMPLANT (N/A)  SURGEON:  Surgeon(s) and Role:    * Hollice Espy, MD - Primary Lavena Stanford, MD- Radiation Oncology  ASSISTANTS: none   ANESTHESIA:   general  EBL:  Total I/O In: 1000 [I.V.:1000] Out: -   Drains: none  Specimen: none  COUNTS CORRECT: YES  PLAN OF CARE: Discharge to home after PACU  PATIENT DISPOSITION:  PACU - hemodynamically stable.

## 2015-01-06 NOTE — Anesthesia Postprocedure Evaluation (Signed)
  Anesthesia Post-op Note  Patient: [name].  Procedure(s) Performed: Procedure(s): RADIOACTIVE SEED IMPLANT/BRACHYTHERAPY IMPLANT (N/A)  Anesthesia type:General  Patient location: PACU  Post pain: Pain level controlled  Post assessment: Post-op Vital signs reviewed, Patient's Cardiovascular Status Stable, Respiratory Function Stable, Patent Airway and No signs of Nausea or vomiting  Post vital signs: Reviewed and stable  Last Vitals:  Filed Vitals:   01/06/15 0846  BP:   Pulse:   Temp: 35.8 C  Resp:     Level of consciousness: awake, alert  and patient cooperative  Complications: No apparent anesthesia complications

## 2015-01-06 NOTE — Discharge Instructions (Addendum)
Brachytherapy for Prostate Cancer, Care After Refer to this sheet in the next few weeks. These instructions provide you with information on caring for yourself after your procedure. Your health care provider may also give you more specific instructions. Your treatment has been planned according to current medical practices, but problems sometimes occur. Call your health care provider if you have any problems or questions after your procedure. WHAT TO EXPECT AFTER THE PROCEDURE The area behind the scrotum will probably be tender and bruised. For a short period of time you may have:  Difficulty passing urine. You may need a catheter for a few days to a month.  Blood in the urine or semen.  A feeling of constipation because of prostate swelling.  Frequent feeling of an urgent need to urinate. For a long period of time you may have:  Inflammation of the rectum. This happens in about 2% of people who have the procedure.  Erection problems. These vary with age and occur in about 15-40% of men.  Difficulty urinating. This is caused by scarring in the urethra.  Diarrhea. HOME CARE INSTRUCTIONS   Take medicines only as directed by your health care provider.  You will probably have a catheter in your bladder for several days. You will have blood in the urine bag and should drink a lot of fluids to keep it a light red color.  Keep all follow-up visits as directed by your health care provider. If you have a catheter, it will be removed during one of these visits.  Try not to sit directly on the area behind the scrotum. A soft cushion can decrease the discomfort. Ice packs may also be helpful for the discomfort. Do not put ice directly on the skin.  Shower and wash the area behind the scrotum gently. Do not sit in a tub.  If you have had the brachytherapy that uses the seeds, limit your close contact with children and pregnant women for 2 months because of the radiation still in the prostate.  After that period of time, the levels drop off quickly. SEEK IMMEDIATE MEDICAL CARE IF:   You have a fever.  You have chills.  You have shortness of breath.  You have chest pain.  You have thick blood, like tomato juice, in the urine bag.  Your catheter is blocked so urine cannot get into the bag. Your bladder area or lower abdomen may be swollen.  There is excessive bleeding from your rectum. It is normal to have a little blood mixed with your stool.  There is severe discomfort in the treated area that does not go away with pain medicine.  You have abdominal discomfort.  You have severe nausea or vomiting.  You develop any new or unusual symptoms. Document Released: 08/21/2010 Document Revised: 12/03/2013 Document Reviewed: 01/09/2013 [facility] Patient Information 2015 [place], [place]. This information is not intended to replace advice given to you by your health care provider. Make sure you discuss any questions you have with your health care provider.  AMBULATORY SURGERY  DISCHARGE INSTRUCTIONS   1) The drugs that you were given will stay in your system until tomorrow so for the next 24 hours you should not:  A) Drive an automobile B) Make any legal decisions C) Drink any alcoholic beverage   2) You may resume regular meals tomorrow.  Today it is better to start with liquids and gradually work up to solid foods.  You may eat anything you prefer, but it is better to start with liquids, then soup and crackers, and gradually work up to solid foods.   3) Please notify your doctor immediately if you have any unusual bleeding, trouble breathing, redness and pain at the surgery site, drainage, fever, or pain not relieved by medication. 4)   5) Your post-operative visit with Dr.    George Ina                                 is: Date:                        Time:    Please call to schedule  your post-operative visit.  6) Additional Instructions:

## 2015-01-06 NOTE — Transfer of Care (Signed)
Immediate Anesthesia Transfer of Care Note  Patient: [name].  Procedure(s) Performed: Procedure(s): RADIOACTIVE SEED IMPLANT/BRACHYTHERAPY IMPLANT (N/A)  Patient Location: PACU  Anesthesia Type:General  Level of Consciousness: awake  Airway & Oxygen Therapy: Patient Spontanous Breathing  Post-op Assessment: Report given to RN  Post vital signs: stable  Last Vitals:  Filed Vitals:   01/06/15 0610  BP: 147/89  Pulse: 71  Temp: 35.9 C  Resp: 18    Complications: No apparent anesthesia complications

## 2015-01-31 ENCOUNTER — Other Ambulatory Visit: Payer: Self-pay | Admitting: Radiation Oncology

## 2015-01-31 NOTE — Progress Notes (Signed)
Radiation Oncology Procedure Note  Name: Marquice Uddin.  MRN: [id number]  Date:   01/06/2015     DOB: 1934/07/17   This 81 y.o. 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 Patient does have some urinary symptoms including frequency urgency and nocturia 3. Transportation was an issue he was offered I-125 interstitial implant had volume study is seen today for seed implantation.  PHYSICAL EXAM: Well-developed well-nourished patient in NAD. HEENT reveals PERLA, EOMI, discs not visualized.  Oral cavity is clear. No oral mucosal lesions are identified. Neck is clear without evidence of cervical or supraclavicular adenopathy. Lungs are clear to A&P. Cardiac examination is essentially unremarkable with regular rate and rhythm without murmur rub or thrill. Abdomen is benign with no organomegaly or masses noted. Motor sensory and DTR levels are equal and symmetric in the upper and lower extremities. Cranial nerves II through XII are grossly intact. Proprioception is intact. No peripheral adenopathy or edema is identified. No motor or sensory levels are noted. Crude visual fields are within normal range.   BP Readings from Last 1 Encounters:  01/06/15 129/69   Pulse Readings from Last 1 Encounters:  01/06/15 78   Wt Readings from Last 1 Encounters:  01/06/15 235 lb (106.595 kg)     PROCEDURE: patient was taken to the operating room and general anesthesia was administered. Legs were immobilized in stirrups and patient was positioned in the exact same proportions as original volume study. Patient was prepped and Foley catheter was placed. Ultrasound  guidance identified the prostate and recreated the original set up as per treatment planning volume study. 21 needles were placed under ultrasound guidance with 98 seeds delivered to the prostate volume. After completion of procedure cystoscopy was performed by urology and no evidence of seeds in the bladder were noted. Patient tolerated the procedure extremely well. Initial plain film as doublecheck identified 98 seeds in the prostate. Patient has followup appointment in one month for CT scan for quality assurance will be performed.   PLAN: At the present time patient is set up for follow-up in one month with CT scan for quality assurance for placement of seed sources. Patient also or he has follow-up appointments with urology. Patient is aware of her PSA protocol to start testing PSA in about 4-5 months. Patient and family know to call with any concerns.    Armstead Peaks., MD

## 2015-02-04 ENCOUNTER — Encounter: Payer: Self-pay | Admitting: *Deleted

## 2015-02-04 ENCOUNTER — Other Ambulatory Visit: Payer: Self-pay | Admitting: *Deleted

## 2015-02-04 ENCOUNTER — Ambulatory Visit
Admission: RE | Admit: 2015-02-04 | Discharge: 2015-02-04 | Disposition: A | Discharge status: Home / Self Care | Payer: Medicare Other | Source: Ambulatory Visit | Attending: Radiation Oncology | Admitting: Radiation Oncology

## 2015-02-04 ENCOUNTER — Encounter: Payer: Self-pay | Admitting: Radiation Oncology

## 2015-02-04 VITALS — BP 116/66 | HR 79 | Temp 96.0°F | Resp 20 | Wt 234.1 lb

## 2015-02-04 DIAGNOSIS — C61 Malignant neoplasm of prostate: Secondary | ICD-10-CM

## 2015-02-04 MED ORDER — MIRABEGRON ER 25 MG PO TB24: 25.0000 mg | ORAL_TABLET | Freq: Every day | ORAL | Status: DC

## 2015-02-04 NOTE — Progress Notes (Signed)
Radiation Oncology Follow up Note  Name: Zandyr Barnhill.   Date:   02/04/2015 MRN:  [id number] DOB: [birth year]    This 81 y.o. male presents to the clinic today for follow-up for prostate cancer.  REFERRING PROVIDER: Dion Body, MD  HPI: Patient is a 81 year old male now one month out having completed I-125 interstitial implant for Gleason 7 (3+4) adenocarcinoma the prostate presenting with a PSA of 9.7. Prior to treatment he did have symptoms of nocturia 3 urgency and frequency and had a over 70 mL liter prostate. He is seen today in follow-up still having some problems with urinary frequency urgency and nocturia. He's been on Flomax. He specifically denies diarrhea..  COMPLICATIONS OF TREATMENT: present  FOLLOW UP COMPLIANCE: keeps appointments   PHYSICAL EXAM:  BP 116/66 mmHg  Pulse 79  Temp(Src) 96 F (35.6 C)  Resp 20  Wt 234 lb 2.1 oz (106.2 kg) On rectal exam rectal sphincter tone is good. Prostate is smooth contracted without evidence of nodularity or mass. Sulcus is preserved bilaterally. No discrete nodularity is identified. No other rectal abnormalities are noted. Well-developed well-nourished patient in NAD. HEENT reveals PERLA, EOMI, discs not visualized.  Oral cavity is clear. No oral mucosal lesions are identified. Neck is clear without evidence of cervical or supraclavicular adenopathy. Lungs are clear to A&P. Cardiac examination is essentially unremarkable with regular rate and rhythm without murmur rub or thrill. Abdomen is benign with no organomegaly or masses noted. Motor sensory and DTR levels are equal and symmetric in the upper and lower extremities. Cranial nerves II through XII are grossly intact. Proprioception is intact. No peripheral adenopathy or edema is identified. No motor or sensory levels are noted. Crude visual fields are within normal range.   RADIOLOGY RESULTS: CT scan for quality assurance shows excellent position placement of I-125  sources  PLAN: At the present time he is doing fairly well I'm stopping his Flomax and going to an anti-spasmodic for his bladder Myrbetriq. I've spine to my PSA protocol would like to see PSA in about 4 months and set up a follow-up appointment at that time. Patient's to call sooner should his urinary symptoms not improve I have explained to him his sources are still hot causing some of his lower urinary tract symptoms.  I would like to take this opportunity for allowing me to participate in the care of your patient.Armstead Peaks., MD

## 2015-02-04 NOTE — Progress Notes (Signed)
Survivorship information and ASCO answers to Survivorship Care booklet mailed to the patient.  Resources given about Care program and cancer transitions class.  Pt instructed to call with any questions.

## 2015-02-07 DIAGNOSIS — Z510 Encounter for antineoplastic radiation therapy: Secondary | ICD-10-CM | POA: Diagnosis not present

## 2015-02-20 ENCOUNTER — Telehealth: Payer: Self-pay | Admitting: *Deleted

## 2015-02-20 DIAGNOSIS — I679 Cerebrovascular disease, unspecified: Secondary | ICD-10-CM

## 2015-02-20 DIAGNOSIS — I712 Thoracic aortic aneurysm, without rupture, unspecified: Secondary | ICD-10-CM

## 2015-02-20 NOTE — Telephone Encounter (Signed)
Spoke with pt, it is time to check carotid dopplers and CTA to assess his thoracic aneurysm. CTA, carotid doppler and f/u appt with dr Stanford Breed made. Patient voiced understanding of appt times and locations. Will call dr Farris Has office [facility]) to get recent lab work

## 2015-02-25 ENCOUNTER — Telehealth: Payer: Self-pay | Admitting: Cardiology

## 2015-02-25 NOTE — Telephone Encounter (Signed)
FORWARD TO DEBRA 

## 2015-02-25 NOTE — Telephone Encounter (Signed)
Tedra Coupe is calling in stating that the patient is due to come in for a CT on 8/8 and she had not seen any recent creatine levels and wanted to make sure that she wasn't missing anything . Please call  Thanks

## 2015-02-25 NOTE — Telephone Encounter (Signed)
Spoke with vanda, aware pt had labs done at PCP. They have been given to medical records for scanning.

## 2015-02-28 ENCOUNTER — Encounter: Payer: Self-pay | Admitting: Cardiology

## 2015-03-03 ENCOUNTER — Ambulatory Visit (HOSPITAL_BASED_OUTPATIENT_CLINIC_OR_DEPARTMENT_OTHER)
Admission: RE | Admit: 2015-03-03 | Discharge: 2015-03-03 | Disposition: A | Discharge status: Home / Self Care | Payer: Medicare Other | Source: Ambulatory Visit | Attending: Cardiology | Admitting: Cardiology

## 2015-03-03 ENCOUNTER — Encounter (HOSPITAL_COMMUNITY): Payer: Self-pay

## 2015-03-03 ENCOUNTER — Ambulatory Visit (HOSPITAL_COMMUNITY)
Admission: RE | Admit: 2015-03-03 | Discharge: 2015-03-03 | Disposition: A | Discharge status: Home / Self Care | Payer: Medicare Other | Source: Ambulatory Visit | Attending: Cardiology | Admitting: Cardiology

## 2015-03-03 DIAGNOSIS — I712 Thoracic aortic aneurysm, without rupture, unspecified: Secondary | ICD-10-CM

## 2015-03-03 DIAGNOSIS — I679 Cerebrovascular disease, unspecified: Secondary | ICD-10-CM | POA: Insufficient documentation

## 2015-03-03 DIAGNOSIS — I6523 Occlusion and stenosis of bilateral carotid arteries: Secondary | ICD-10-CM | POA: Diagnosis not present

## 2015-03-03 DIAGNOSIS — I350 Nonrheumatic aortic (valve) stenosis: Secondary | ICD-10-CM | POA: Insufficient documentation

## 2015-03-03 MED ORDER — IOHEXOL 350 MG/ML SOLN
100.0000 mL | Freq: Once | INTRAVENOUS | Status: AC | PRN
  Administered 2015-03-03: 100 mL via INTRAVENOUS

## 2015-05-06 NOTE — Progress Notes (Signed)
HPI: FU coronary artery disease. He underwent coronary bypass graft in December 2007, with a LIMA to LAD, saphenous vein graft to first diagonal with sequential to the circumflex, saphenous graft to the PDA with sequential to a right posterolateral. A previous Holter monitor showed brief atrial fibrillation. Nuclear study in June of 2013 showed an ejection fraction of 67%. There was diaphragmatic attenuation and apical thinning but no ischemia. Echocardiogram in July 2015 showed normal LV function, grade 1 diastolic dysfunction, mild aortic stenosis (mean gradient 12 mmHg), mild to moderate left atrial enlargement, mildly dilated aortic root. Abdominal CT April 2016 showed no aneurysm. Carotid Dopplers in August 2016 revealed 1-39% stenosis bilaterally. Since he was last seen, the patient denies any dyspnea on exertion, orthopnea, PND, pedal edema, palpitations, syncope or chest pain.   Current Outpatient Prescriptions  Medication Sig Dispense Refill  . aspirin 81 MG tablet Take 81 mg by mouth daily.      . Cholecalciferol (VITAMIN D-3 PO) Take 1 tablet by mouth daily.    . ciprofloxacin (CIPRO) 500 MG tablet Take 1 tablet (500 mg total) by mouth 2 (two) times daily. 6 tablet 0  . Coenzyme Q10 (CO Q-10) 100 MG CAPS Take 1 tablet by mouth daily.     Marland Kitchen docusate sodium (COLACE) 100 MG capsule Take 100 mg by mouth daily.    . enalapril (VASOTEC) 10 MG tablet TAKE ONE TABLET BY MOUTH EVERY DAY 90 tablet 1  . Garlic 1962 MG CAPS Take 1 capsule by mouth daily.     . Red Yeast Rice 600 MG TABS Take 2 tablets by mouth 2 (two) times daily.     . tamsulosin (FLOMAX) 0.4 MG CAPS capsule Take 1 capsule by mouth daily.    . vitamin E 400 UNIT capsule Take 400 Units by mouth daily.     No current facility-administered medications for this visit.     Past Medical History  Diagnosis Date  . TIA Feb 2012    no residual  . HYPERTENSION   . HYPERCHOLESTEROLEMIA     statin intolerant  . PAF  (paroxysmal atrial fibrillation) (HCC)     briefly noted on Holter monitor  . CAD     s/p CABG in 2007 x 5  . BRADYCARDIA   . Aortic stenosis   . NEPHROLITHIASIS   . Long term (current) use of anticoagulants   . Carotid stenosis     0-39% bilaterally  . Skin cancer   . Prostate cancer ([place])   . Stroke ([place])   . BPH (benign prostatic hypertrophy)   . Atrial fibrillation ([place])   . TIA (transient ischemic attack)   . Complication of anesthesia     Past Surgical History  Procedure Laterality Date  . Coronary artery bypass graft  2007    x 5  . Lithotripsy  2013  . Total hip arthroplasty Left   . Hernia repair Left 1996    inguinal  . Appendectomy  1999  . Colonoscopy    . Joint replacement    . Cardiac arrest following cabg N/A   . Radioactive seed implant N/A 01/06/2015    Procedure: RADIOACTIVE SEED IMPLANT/BRACHYTHERAPY IMPLANT;  Surgeon: Hollice Espy, MD;  Location: [facility] ORS;  Service: Urology;  Laterality: N/A;    Social History   Social History  . Marital Status: Divorced    Spouse Name: N/A  . Number of Children: N/A  . Years of Education: N/A   Occupational History  .  Not on file.   Social History Main Topics  . Smoking status: Former Smoker -- 1.00 packs/day    Types: Cigarettes    Quit date: 11/30/1968  . Smokeless tobacco: Former Systems developer    Types: [address] date: 12/26/1987  . Alcohol Use: No  . Drug Use: No  . Sexual Activity: Not Currently   Other Topics Concern  . Not on file   Social History Narrative    ROS: no fevers or chills, productive cough, hemoptysis, dysphasia, odynophagia, melena, hematochezia, dysuria, hematuria, rash, seizure activity, orthopnea, PND, pedal edema, claudication. Remaining systems are negative.  Physical Exam: Well-developed well-nourished in no acute distress.  Skin is warm and dry.  HEENT is normal.  Neck is supple.  Chest is clear to auscultation with normal expansion.  Cardiovascular exam is regular rate  and rhythm. 3/6 systolic murmur left sternal border radiating to the carotids. Abdominal exam nontender or distended. No masses palpated. Extremities show no edema. neuro grossly intact  ECG Sinus rhythm with first-degree AV block. Left axis deviation. Incomplete right bundle branch block. Cannot rule out prior septal infarct.

## 2015-05-12 ENCOUNTER — Encounter: Payer: Self-pay | Admitting: *Deleted

## 2015-05-12 ENCOUNTER — Encounter: Payer: Self-pay | Admitting: Cardiology

## 2015-05-12 ENCOUNTER — Ambulatory Visit (INDEPENDENT_AMBULATORY_CARE_PROVIDER_SITE_OTHER): Payer: Medicare Other | Admitting: Cardiology

## 2015-05-12 VITALS — BP 132/60 | HR 53 | Ht 76.0 in | Wt 241.6 lb

## 2015-05-12 DIAGNOSIS — I679 Cerebrovascular disease, unspecified: Secondary | ICD-10-CM | POA: Diagnosis not present

## 2015-05-12 DIAGNOSIS — I10 Essential (primary) hypertension: Secondary | ICD-10-CM | POA: Diagnosis not present

## 2015-05-12 DIAGNOSIS — I2510 Atherosclerotic heart disease of native coronary artery without angina pectoris: Secondary | ICD-10-CM

## 2015-05-12 DIAGNOSIS — I480 Paroxysmal atrial fibrillation: Secondary | ICD-10-CM | POA: Diagnosis not present

## 2015-05-12 DIAGNOSIS — I359 Nonrheumatic aortic valve disorder, unspecified: Secondary | ICD-10-CM

## 2015-05-12 MED ORDER — ENALAPRIL MALEATE 10 MG PO TABS: ORAL_TABLET | ORAL | Status: DC

## 2015-05-12 NOTE — Assessment & Plan Note (Signed)
Continue aspirin. Intolerant to statins. 

## 2015-05-12 NOTE — Assessment & Plan Note (Addendum)
Murmur sounds worse. Plan repeat echocardiogram.

## 2015-05-12 NOTE — Assessment & Plan Note (Signed)
Expand All Collapse All   Patient remains in sinus rhythm. Continue aspirin. He declines Coumadin and understands the risk of CVA.

## 2015-05-12 NOTE — Assessment & Plan Note (Signed)
Intolerant to statins. Continue diet. 

## 2015-05-12 NOTE — Assessment & Plan Note (Signed)
Follow-up carotid Dopplers August 2017. 

## 2015-05-12 NOTE — Assessment & Plan Note (Signed)
Blood pressure controlled. Continue present medications. 

## 2015-05-12 NOTE — Patient Instructions (Signed)
Medication Instructions:   NO CHANGE  Testing/Procedures:  Your physician has requested that you have an echocardiogram. Echocardiography is a painless test that uses sound waves to create images of your heart. It provides your doctor with information about the size and shape of your heart and how well your heart's chambers and valves are working. This procedure takes approximately one hour. There are no restrictions for this procedure.    Follow-Up:  Your physician wants you to follow-up in: ONE YEAR WITH DR CRENSHAW You will receive a reminder letter in the mail two months in advance. If you don't receive a letter, please call our office to schedule the follow-up appointment.      

## 2015-05-22 ENCOUNTER — Other Ambulatory Visit: Payer: Self-pay

## 2015-05-22 ENCOUNTER — Ambulatory Visit (HOSPITAL_COMMUNITY): Payer: Medicare Other | Attending: Cardiology

## 2015-05-22 DIAGNOSIS — Z951 Presence of aortocoronary bypass graft: Secondary | ICD-10-CM | POA: Insufficient documentation

## 2015-05-22 DIAGNOSIS — I352 Nonrheumatic aortic (valve) stenosis with insufficiency: Secondary | ICD-10-CM | POA: Insufficient documentation

## 2015-05-22 DIAGNOSIS — Z8249 Family history of ischemic heart disease and other diseases of the circulatory system: Secondary | ICD-10-CM | POA: Insufficient documentation

## 2015-05-22 DIAGNOSIS — I517 Cardiomegaly: Secondary | ICD-10-CM | POA: Insufficient documentation

## 2015-05-22 DIAGNOSIS — E785 Hyperlipidemia, unspecified: Secondary | ICD-10-CM | POA: Insufficient documentation

## 2015-05-22 DIAGNOSIS — Z87891 Personal history of nicotine dependence: Secondary | ICD-10-CM | POA: Diagnosis not present

## 2015-05-22 DIAGNOSIS — I10 Essential (primary) hypertension: Secondary | ICD-10-CM | POA: Insufficient documentation

## 2015-05-22 DIAGNOSIS — I059 Rheumatic mitral valve disease, unspecified: Secondary | ICD-10-CM | POA: Insufficient documentation

## 2015-05-22 DIAGNOSIS — I77819 Aortic ectasia, unspecified site: Secondary | ICD-10-CM | POA: Insufficient documentation

## 2015-05-22 DIAGNOSIS — I359 Nonrheumatic aortic valve disorder, unspecified: Secondary | ICD-10-CM

## 2015-05-22 DIAGNOSIS — I350 Nonrheumatic aortic (valve) stenosis: Secondary | ICD-10-CM | POA: Diagnosis present

## 2015-05-23 ENCOUNTER — Other Ambulatory Visit: Payer: Self-pay | Admitting: *Deleted

## 2015-05-23 DIAGNOSIS — I712 Thoracic aortic aneurysm, without rupture, unspecified: Secondary | ICD-10-CM

## 2015-05-26 ENCOUNTER — Other Ambulatory Visit: Payer: Self-pay | Admitting: *Deleted

## 2015-05-26 DIAGNOSIS — I10 Essential (primary) hypertension: Secondary | ICD-10-CM

## 2015-05-28 LAB — BASIC METABOLIC PANEL
BUN: 12 mg/dL (ref 7–25)
CO2: 28 mmol/L (ref 20–31)
Calcium: 9.2 mg/dL (ref 8.6–10.3)
Chloride: 106 mmol/L (ref 98–110)
Creat: 1.16 mg/dL (ref 0.70–1.18)
Glucose, Bld: 84 mg/dL (ref 65–99)
POTASSIUM: 4.6 mmol/L (ref 3.5–5.3)
SODIUM: 143 mmol/L (ref 135–146)

## 2015-06-02 ENCOUNTER — Ambulatory Visit (INDEPENDENT_AMBULATORY_CARE_PROVIDER_SITE_OTHER)
Admission: RE | Admit: 2015-06-02 | Discharge: 2015-06-02 | Disposition: A | Discharge status: Home / Self Care | Payer: Medicare Other | Source: Ambulatory Visit | Attending: Cardiology | Admitting: Cardiology

## 2015-06-02 DIAGNOSIS — I712 Thoracic aortic aneurysm, without rupture, unspecified: Secondary | ICD-10-CM

## 2015-06-02 MED ORDER — IOHEXOL 350 MG/ML SOLN
100.0000 mL | Freq: Once | INTRAVENOUS | Status: AC | PRN
  Administered 2015-06-02: 100 mL via INTRAVENOUS

## 2015-06-25 ENCOUNTER — Encounter: Payer: Self-pay | Admitting: Radiation Oncology

## 2015-06-25 ENCOUNTER — Inpatient Hospital Stay: Payer: Medicare Other | Attending: Radiation Oncology

## 2015-06-25 ENCOUNTER — Ambulatory Visit
Admission: RE | Admit: 2015-06-25 | Discharge: 2015-06-25 | Disposition: A | Discharge status: Home / Self Care | Payer: Medicare Other | Source: Ambulatory Visit | Attending: Radiation Oncology | Admitting: Radiation Oncology

## 2015-06-25 DIAGNOSIS — R972 Elevated prostate specific antigen [PSA]: Secondary | ICD-10-CM | POA: Diagnosis not present

## 2015-06-25 DIAGNOSIS — C61 Malignant neoplasm of prostate: Secondary | ICD-10-CM

## 2015-06-25 LAB — PSA: PSA: 0.21 ng/mL (ref 0.00–4.00)

## 2015-06-25 NOTE — Progress Notes (Signed)
Radiation Oncology Follow up Note  Name: [name].   Date:   06/25/2015 MRN:  [id number] DOB: November 29, 1933    This 81 y.o. male presents to the clinic today for follow-up for prostate cancer stage IIa (T2 N0 M0).  REFERRING PROVIDER: Dion Body, MD  HPI: Patient is a 81 year old male now out 5 months having completed I-125 interstitial implant for a stage IIa Gleason 7 (3+4) adenocarcinoma presenting the PSA of 9.7. He is seen today in routine follow-up and is doing well specifically denies diarrhea dysuria or any other GI/GU complaints. He had a PSA performed today.  COMPLICATIONS OF TREATMENT: none  FOLLOW UP COMPLIANCE: keeps appointments   PHYSICAL EXAM:  There were no vitals taken for this visit. On rectal exam rectal sphincter tone is good. Prostate is smooth contracted without evidence of nodularity or mass. Sulcus is preserved bilaterally. No discrete nodularity is identified. No other rectal abnormalities are noted. Well-developed well-nourished patient in NAD. HEENT reveals PERLA, EOMI, discs not visualized.  Oral cavity is clear. No oral mucosal lesions are identified. Neck is clear without evidence of cervical or supraclavicular adenopathy. Lungs are clear to A&P. Cardiac examination is essentially unremarkable with regular rate and rhythm without murmur rub or thrill. Abdomen is benign with no organomegaly or masses noted. Motor sensory and DTR levels are equal and symmetric in the upper and lower extremities. Cranial nerves II through XII are grossly intact. Proprioception is intact. No peripheral adenopathy or edema is identified. No motor or sensory levels are noted. Crude visual fields are within normal range.  RADIOLOGY RESULTS: No current films for review  PLAN: Present time patient is 5 months out from I-125 interstitial implant doing well without significant side effect. I've performed PSA level on him today and will report that separately. Otherwise  I've asked to see him back in 6 months and we will track his PSAs accordingly. Patient knows to call with any concerns.  I would like to take this opportunity for allowing me to participate in the care of your patient.Armstead Peaks., MD

## 2015-07-01 ENCOUNTER — Other Ambulatory Visit: Payer: Self-pay | Admitting: *Deleted

## 2015-07-01 MED ORDER — PHENAZOPYRIDINE HCL 200 MG PO TABS: 200.0000 mg | ORAL_TABLET | Freq: Two times a day (BID) | ORAL | Status: DC | PRN

## 2015-12-26 ENCOUNTER — Inpatient Hospital Stay: Payer: Medicare Other | Attending: Radiation Oncology

## 2015-12-26 ENCOUNTER — Ambulatory Visit: Payer: Medicare Other | Attending: Radiation Oncology | Admitting: Radiation Oncology

## 2016-05-14 IMAGING — CT CT ANGIO CHEST
2 of 6 series · 13 of 36 positions shown · IV contrast (Omnipaque 300)
Comparison: Prior chest CT 03/03/2015

CLINICAL DATA: 80-year-old male with thoracic aortic aneurysm with
concern for enlargement on echocardiography

EXAM:
CT ANGIOGRAPHY CHEST WITH CONTRAST
TECHNIQUE: Multidetector CT imaging of the chest was performed using the
standard protocol during bolus administration of intravenous
contrast. Multiplanar CT image reconstructions and MIPs were
obtained to evaluate the vascular anatomy.
CONTRAST:  100mL OMNIPAQUE IOHEXOL 350 MG/ML SOLN

[Series 4: cta chest w/cm 3mm · axial · 0.87mm/px · z∈[-290,-50]mm · 12 of 96 slices shown]
[im 8/96  lung]
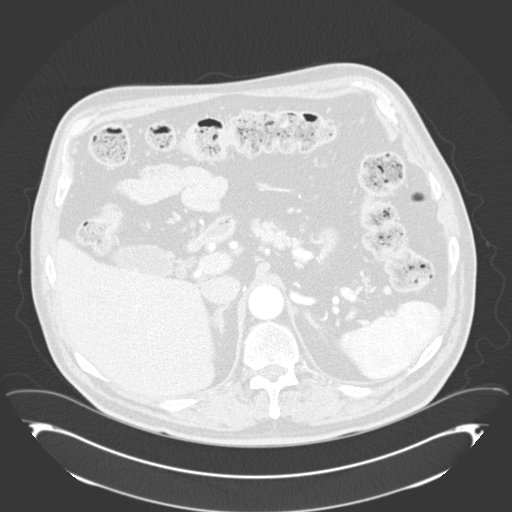
[im 15/96  mediastinal]
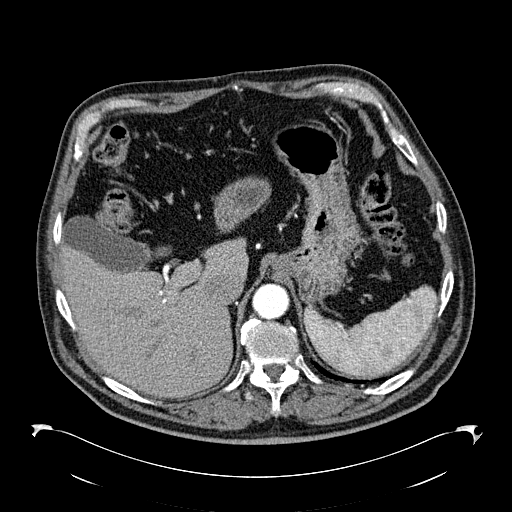
[im 22/96  lung]
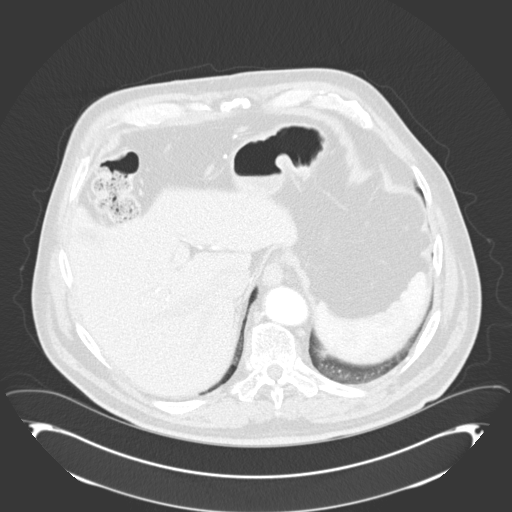
[im 30/96  mediastinal]
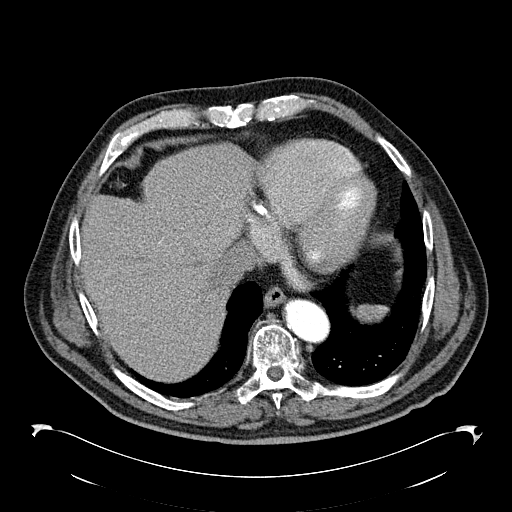
[im 37/96  lung]
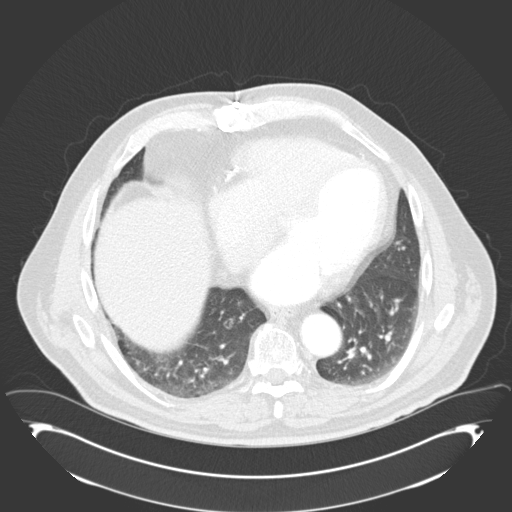
[im 44/96  mediastinal]
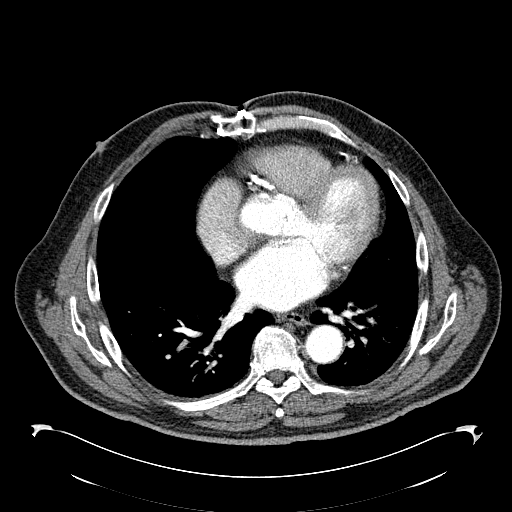
[im 52/96  lung]
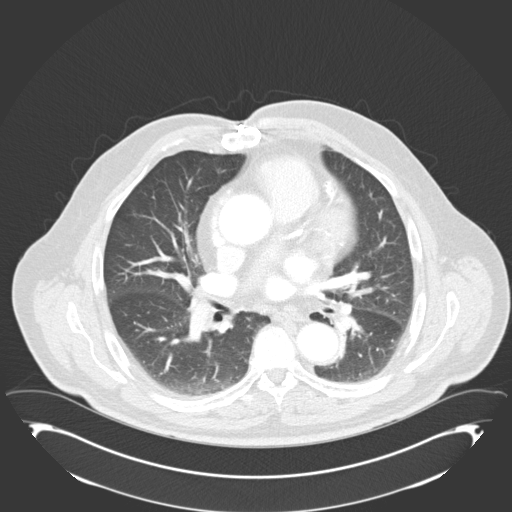
[im 59/96  mediastinal]
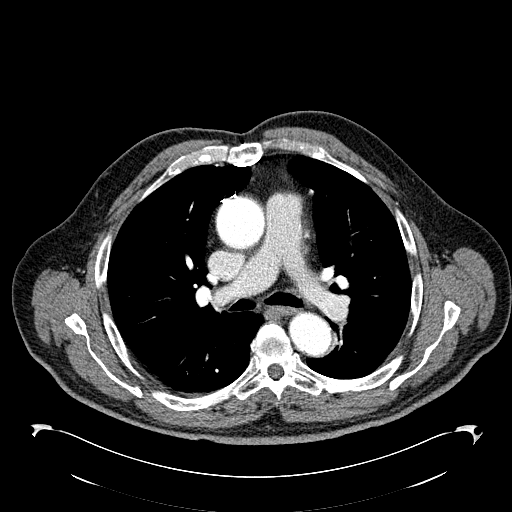
[im 66/96  lung]
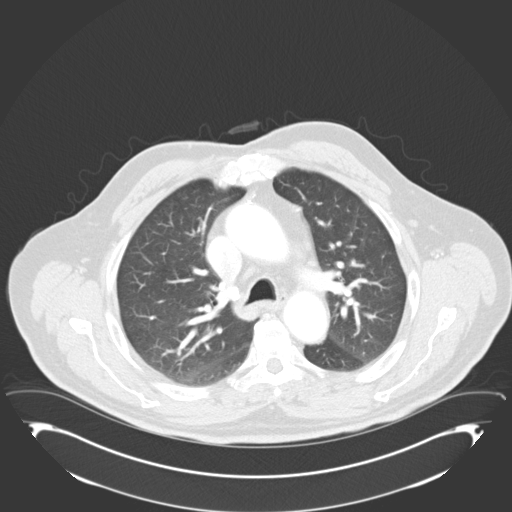
[im 74/96  mediastinal]
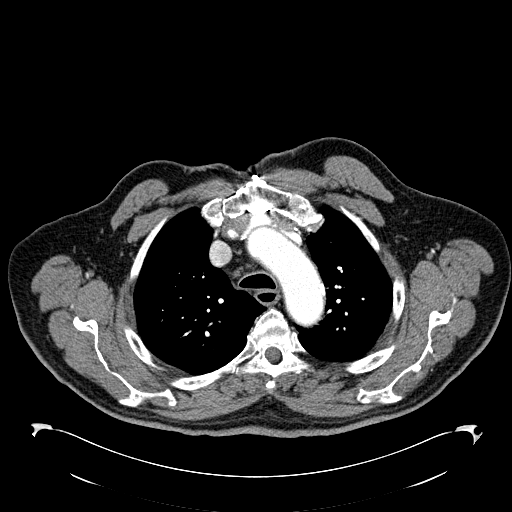
[im 81/96  lung]
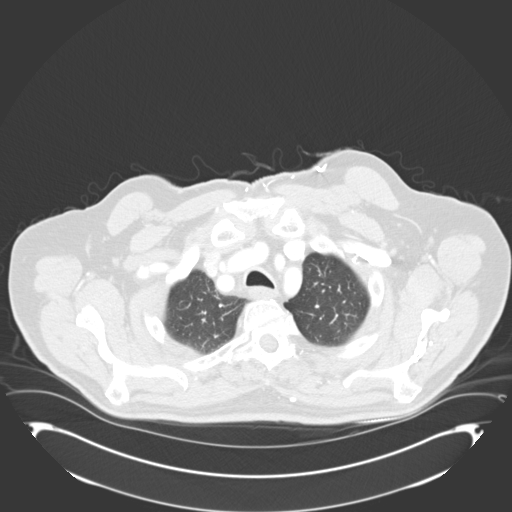
[im 88/96  mediastinal]
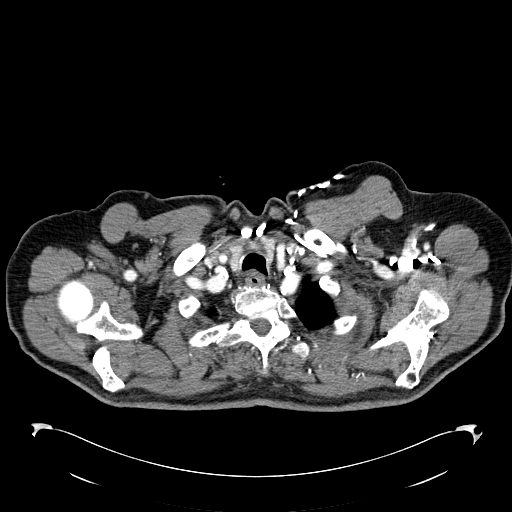

[Series 602: cor mpr · coronal · 0.87mm/px · 1 of 141 slices shown]
[im 71/141  mediastinal]
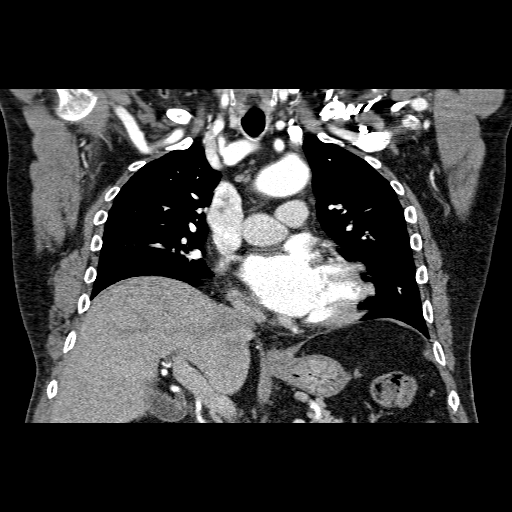

[13 of 36 positions shown; findings below may reference images not displayed]

FINDINGS: Mediastinum: Unremarkable CT appearance of the thyroid gland. No
suspicious mediastinal or hilar adenopathy. No soft tissue
mediastinal mass. The thoracic esophagus is unremarkable.

Heart/Vascular: Mild aneurysmal dilatation of the ascending thoracic
aortic aneurysm. By my measurement, the maximal diameter outer wall
to outer wall is 4.5 cm which is unchanged compared to the prior CT
scan dated 03/03/2015 although at that time the measurement was
listed as 4.3 cm. I think this may have been a slight
underestimation of the true aortic size. The transverse and
descending thoracic aorta remain normal in caliber as does the
aortic root. There is no effacement of the Herrmann junction.
However, there is coarse calcification and thickening of the aortic
valve raising concern for underlying aortic stenosis. The main
pulmonary artery is within normal limits. Calcifications are present
along the course of the visualized coronary arteries. Moderate
cardiomegaly without significant interval change. The right heart is
dilated. No pericardial effusion.

Lungs/Pleura: Mild dependent atelectasis. Otherwise, the lungs are
clear.

Bones/Soft Tissues: Healed median sternotomy. No acute fracture or
aggressive appearing lytic or blastic osseous lesion.

Upper Abdomen: Visualized upper abdominal organs are unremarkable.

Review of the MIP images confirms the above findings.
IMPRESSION: VASCULAR

1. Mild aneurysmal dilatation of the tubular portion of the
ascending thoracic aorta with a maximal diameter of 4.5 cm which is
unchanged compared to my measurements on the prior CT scan dated
03/03/2015. This measurement was previously reported as 4.3 cm which
may have been a slight under estimate. They aortic size has slightly
increased compared to 4.3 cm compared to 02/27/2014.
2. Atherosclerosis including multivessel coronary artery
calcification.
NON VASCULAR

1. Calcified and thickened aortic valve consistent with known mild
-moderate aortic stenosis.
2. Cardiomegaly with right heart enlargement.

## 2016-05-27 ENCOUNTER — Telehealth: Payer: Self-pay | Admitting: *Deleted

## 2016-05-27 DIAGNOSIS — I712 Thoracic aortic aneurysm, without rupture, unspecified: Secondary | ICD-10-CM

## 2016-05-27 NOTE — Telephone Encounter (Signed)
Left message for pt to call, it is time to schedule CTA to follow up on thoracic aneurysm and he is also due to see dr Stanford Breed.

## 2016-06-01 NOTE — Telephone Encounter (Signed)
Returning your call from last week. °

## 2016-06-01 NOTE — Telephone Encounter (Signed)
Spoke with pt, order placed for CTA to f/u thoracic aneurysm and Follow up scheduled with dr Stanford Breed

## 2016-06-07 ENCOUNTER — Other Ambulatory Visit: Payer: Self-pay | Admitting: *Deleted

## 2016-06-07 DIAGNOSIS — I10 Essential (primary) hypertension: Secondary | ICD-10-CM

## 2016-06-08 ENCOUNTER — Other Ambulatory Visit: Payer: Self-pay | Admitting: Cardiology

## 2016-06-09 NOTE — Telephone Encounter (Signed)
Rx has been sent to the pharmacy electronically. ° °

## 2016-06-10 ENCOUNTER — Other Ambulatory Visit: Payer: Self-pay

## 2016-06-10 ENCOUNTER — Telehealth: Payer: Self-pay | Admitting: *Deleted

## 2016-06-10 DIAGNOSIS — I10 Essential (primary) hypertension: Secondary | ICD-10-CM

## 2016-06-10 DIAGNOSIS — Z01818 Encounter for other preprocedural examination: Secondary | ICD-10-CM

## 2016-06-10 LAB — BASIC METABOLIC PANEL
BUN: 18 mg/dL (ref 7–25)
CHLORIDE: 107 mmol/L (ref 98–110)
CO2: 24 mmol/L (ref 20–31)
Calcium: 9 mg/dL (ref 8.6–10.3)
Creat: 1.38 mg/dL — ABNORMAL HIGH (ref 0.70–1.18)
Glucose, Bld: 110 mg/dL — ABNORMAL HIGH (ref 65–99)
POTASSIUM: 4.6 mmol/L (ref 3.5–5.3)
SODIUM: 140 mmol/L (ref 135–146)

## 2016-06-10 NOTE — Telephone Encounter (Signed)
Raeann from [facility] called. Patient is there to get BMP. Order cannot be viewed in her system. BMP order re-enetered into [facility].

## 2016-06-14 NOTE — Progress Notes (Signed)
HPI: FU coronary artery disease. He underwent coronary bypass graft in December 2007, with a LIMA to LAD, saphenous vein graft to first diagonal with sequential to the circumflex, saphenous graft to the PDA with sequential to a right posterolateral. A previous Holter monitor showed brief atrial fibrillation. Nuclear study in June of 2013 showed an ejection fraction of 67%. There was diaphragmatic attenuation and apical thinning but no ischemia. Abdominal CT April 2016 showed no aneurysm. Carotid Dopplers in August 2016 revealed 1-39% stenosis bilaterally. Last echocardiogram October 2016 showed vigorous LV systolic function, grade 1 diastolic dysfunction, mild to moderate aortic stenosis with mean gradient 18 mmHg, trace aortic insufficiency, dilated aortic root at 45 mm, biatrial enlargement and mild right ventricular enlargement. CTA November 2017 showed thoracic aortic aneurysm of 4.4 cm. Since he was last seen, the patient has dyspnea with more extreme activities but not with routine activities. It is relieved with rest. It is not associated with chest pain. There is no orthopnea, PND or pedal edema. There is no syncope or palpitations. There is no exertional chest pain.   Current Outpatient Prescriptions  Medication Sig Dispense Refill  . aspirin 81 MG tablet Take 81 mg by mouth daily.      . brimonidine (ALPHAGAN P) 0.1 % SOLN Apply to eye.    . Cholecalciferol (VITAMIN D-3 PO) Take 1 tablet by mouth daily.    . Difluprednate 0.05 % EMUL Apply to eye.    . docusate sodium (COLACE) 100 MG capsule Take 100 mg by mouth daily.    . enalapril (VASOTEC) 10 MG tablet TAKE 1 TABLET BY MOUTH EVERY DAY 90 tablet 0  . Garlic 123XX123 MG CAPS Take 1 capsule by mouth daily.     Marland Kitchen ofloxacin (OCUFLOX) 0.3 % ophthalmic solution Apply to eye.    . Red Yeast Rice 600 MG TABS Take 2 tablets by mouth 2 (two) times daily.     . tamsulosin (FLOMAX) 0.4 MG CAPS capsule Take 1 capsule by mouth 2 (two) times daily.     . vitamin E 400 UNIT capsule Take 400 Units by mouth daily.     No current facility-administered medications for this visit.      Past Medical History:  Diagnosis Date  . Aortic stenosis   . Atrial fibrillation ([place])   . BPH (benign prostatic hypertrophy)   . BRADYCARDIA   . CAD    s/p CABG in 2007 x 5  . Carotid stenosis    0-39% bilaterally  . Complication of anesthesia   . HYPERCHOLESTEROLEMIA    statin intolerant  . HYPERTENSION   . Long term (current) use of anticoagulants   . NEPHROLITHIASIS   . PAF (paroxysmal atrial fibrillation) (HCC)    briefly noted on Holter monitor  . Prostate cancer ([place])   . Skin cancer   . Stroke ([place])   . TIA Feb 2012   no residual  . TIA (transient ischemic attack)     Past Surgical History:  Procedure Laterality Date  . APPENDECTOMY  1999  . cardiac arrest following CABG N/A   . COLONOSCOPY    . CORONARY ARTERY BYPASS GRAFT  2007   x 5  . HERNIA REPAIR Left 1996   inguinal  . JOINT REPLACEMENT    . LITHOTRIPSY  2013  . RADIOACTIVE SEED IMPLANT N/A 01/06/2015   Procedure: RADIOACTIVE SEED IMPLANT/BRACHYTHERAPY IMPLANT;  Surgeon: Hollice Espy, MD;  Location: [facility] ORS;  Service: Urology;  Laterality: N/A;  .  TOTAL HIP ARTHROPLASTY Left     Social History   Social History  . Marital status: Divorced    Spouse name: N/A  . Number of children: N/A  . Years of education: N/A   Occupational History  . Not on file.   Social History Main Topics  . Smoking status: Former Smoker    Packs/day: 1.00    Types: Cigarettes    Quit date: 11/30/1968  . Smokeless tobacco: Former Systems developer    Types: Chew    Quit date: 12/26/1987  . Alcohol use No  . Drug use: No  . Sexual activity: Not Currently   Other Topics Concern  . Not on file   Social History Narrative  . No narrative on file    Family History  Problem Relation Age of Onset  . Heart disease Mother   . Heart failure Mother   . Heart attack Father     ROS: no fevers  or chills, productive cough, hemoptysis, dysphasia, odynophagia, melena, hematochezia, dysuria, hematuria, rash, seizure activity, orthopnea, PND, pedal edema, claudication. Remaining systems are negative.  Physical Exam: Well-developed well-nourished in no acute distress.  Skin is warm and dry.  HEENT is normal.  Neck is supple.  Chest is clear to auscultation with normal expansion.  Cardiovascular exam is regular rate and rhythm. 3/6 systolic murmur left sternal border. Abdominal exam nontender or distended. No masses palpated. Extremities show no edema. neuro grossly intact  ECG-sinus rhythm with first-degree AV block, left axis deviation, septal infarct.  A/P  1 paroxysmal atrial fibrillation-patient remains in sinus rhythm. Patient has declined Coumadin in the past and understands the higher risk of CVA. Continue aspirin.   2 carotid artery disease-schedule follow-up carotid Dopplers.  3 aortic stenosis-schedule follow-up echocardiogram.  4 hypertension-blood pressure controlled. Continue present medications.  5 hyperlipidemia-intolerant to statins. Continue diet. He will have his lipids drawn in the near future by primary care. I will have those sent to [place]. If LDL elevated we will consider Zetia.   6 coronary artery disease-continue aspirin. Intolerant to statins.   7 thoracic aortic aneurysm-schedule follow-up CTA 11/18.   Kirk Ruths, MD

## 2016-06-16 ENCOUNTER — Ambulatory Visit (INDEPENDENT_AMBULATORY_CARE_PROVIDER_SITE_OTHER)
Admission: RE | Admit: 2016-06-16 | Discharge: 2016-06-16 | Disposition: A | Discharge status: Home / Self Care | Payer: Medicare Other | Source: Ambulatory Visit | Attending: Cardiology | Admitting: Cardiology

## 2016-06-16 DIAGNOSIS — I712 Thoracic aortic aneurysm, without rupture, unspecified: Secondary | ICD-10-CM

## 2016-06-16 MED ORDER — IOPAMIDOL (ISOVUE-370) INJECTION 76%
100.0000 mL | Freq: Once | INTRAVENOUS | Status: AC | PRN
  Administered 2016-06-16: 100 mL via INTRAVENOUS

## 2016-06-21 ENCOUNTER — Encounter: Payer: Self-pay | Admitting: Cardiology

## 2016-06-21 ENCOUNTER — Ambulatory Visit (INDEPENDENT_AMBULATORY_CARE_PROVIDER_SITE_OTHER): Payer: Medicare Other | Admitting: Cardiology

## 2016-06-21 VITALS — BP 144/82 | HR 69 | Ht 76.0 in | Wt 258.6 lb

## 2016-06-21 DIAGNOSIS — E7800 Pure hypercholesterolemia, unspecified: Secondary | ICD-10-CM

## 2016-06-21 DIAGNOSIS — I25810 Atherosclerosis of coronary artery bypass graft(s) without angina pectoris: Secondary | ICD-10-CM | POA: Diagnosis not present

## 2016-06-21 DIAGNOSIS — I480 Paroxysmal atrial fibrillation: Secondary | ICD-10-CM

## 2016-06-21 DIAGNOSIS — I359 Nonrheumatic aortic valve disorder, unspecified: Secondary | ICD-10-CM

## 2016-06-21 DIAGNOSIS — I679 Cerebrovascular disease, unspecified: Secondary | ICD-10-CM

## 2016-06-21 DIAGNOSIS — I712 Thoracic aortic aneurysm, without rupture, unspecified: Secondary | ICD-10-CM

## 2016-06-21 DIAGNOSIS — I10 Essential (primary) hypertension: Secondary | ICD-10-CM

## 2016-06-21 DIAGNOSIS — I2510 Atherosclerotic heart disease of native coronary artery without angina pectoris: Secondary | ICD-10-CM

## 2016-06-21 NOTE — Patient Instructions (Signed)
Medication Instructions:   NO CHANGE  Testing/Procedures:  Your physician has requested that you have an echocardiogram. Echocardiography is a painless test that uses sound waves to create images of your heart. It provides your doctor with information about the size and shape of your heart and how well your heart's chambers and valves are working. This procedure takes approximately one hour. There are no restrictions for this procedure.   Your physician has requested that you have a carotid duplex. This test is an ultrasound of the carotid arteries in your neck. It looks at blood flow through these arteries that supply the brain with blood. Allow one hour for this exam. There are no restrictions or special instructions.    Follow-Up:  Your physician wants you to follow-up in: ONE YEAR WITH DR CRENSHAW You will receive a reminder letter in the mail two months in advance. If you don't receive a letter, please call our office to schedule the follow-up appointment.   If you need a refill on your cardiac medications before your next appointment, please call your pharmacy.    

## 2016-07-13 ENCOUNTER — Telehealth: Payer: Self-pay | Admitting: Cardiology

## 2016-07-13 ENCOUNTER — Ambulatory Visit (HOSPITAL_COMMUNITY)
Admission: RE | Admit: 2016-07-13 | Discharge: 2016-07-13 | Disposition: A | Discharge status: Home / Self Care | Payer: Medicare Other | Source: Ambulatory Visit | Attending: Internal Medicine | Admitting: Internal Medicine

## 2016-07-13 DIAGNOSIS — I10 Essential (primary) hypertension: Secondary | ICD-10-CM | POA: Diagnosis not present

## 2016-07-13 DIAGNOSIS — I2510 Atherosclerotic heart disease of native coronary artery without angina pectoris: Secondary | ICD-10-CM | POA: Insufficient documentation

## 2016-07-13 DIAGNOSIS — I6523 Occlusion and stenosis of bilateral carotid arteries: Secondary | ICD-10-CM | POA: Diagnosis not present

## 2016-07-13 DIAGNOSIS — E785 Hyperlipidemia, unspecified: Secondary | ICD-10-CM | POA: Diagnosis not present

## 2016-07-13 DIAGNOSIS — Z951 Presence of aortocoronary bypass graft: Secondary | ICD-10-CM | POA: Diagnosis not present

## 2016-07-13 DIAGNOSIS — Z87891 Personal history of nicotine dependence: Secondary | ICD-10-CM | POA: Diagnosis not present

## 2016-07-13 DIAGNOSIS — I679 Cerebrovascular disease, unspecified: Secondary | ICD-10-CM

## 2016-07-13 NOTE — Telephone Encounter (Signed)
New message  Pt call requesting to speak with RN. Pt states he received a call from the office. Please call back to discuss if needed.

## 2016-07-13 NOTE — Telephone Encounter (Signed)
Spoke to patient . Not sure who called  patient earlier today. Patient states he does not have answer machine.  he was aware of upcoming appointment on 07/16/16

## 2016-07-16 ENCOUNTER — Other Ambulatory Visit: Payer: Self-pay

## 2016-07-16 ENCOUNTER — Ambulatory Visit (HOSPITAL_COMMUNITY): Payer: Medicare Other | Attending: Cardiology

## 2016-07-16 DIAGNOSIS — I2510 Atherosclerotic heart disease of native coronary artery without angina pectoris: Secondary | ICD-10-CM | POA: Insufficient documentation

## 2016-07-16 DIAGNOSIS — Z6831 Body mass index (BMI) 31.0-31.9, adult: Secondary | ICD-10-CM | POA: Insufficient documentation

## 2016-07-16 DIAGNOSIS — I071 Rheumatic tricuspid insufficiency: Secondary | ICD-10-CM | POA: Insufficient documentation

## 2016-07-16 DIAGNOSIS — I059 Rheumatic mitral valve disease, unspecified: Secondary | ICD-10-CM | POA: Diagnosis not present

## 2016-07-16 DIAGNOSIS — I350 Nonrheumatic aortic (valve) stenosis: Secondary | ICD-10-CM | POA: Diagnosis not present

## 2016-07-16 DIAGNOSIS — Z87891 Personal history of nicotine dependence: Secondary | ICD-10-CM | POA: Diagnosis not present

## 2016-07-16 DIAGNOSIS — I359 Nonrheumatic aortic valve disorder, unspecified: Secondary | ICD-10-CM

## 2016-07-16 DIAGNOSIS — Z8673 Personal history of transient ischemic attack (TIA), and cerebral infarction without residual deficits: Secondary | ICD-10-CM | POA: Diagnosis not present

## 2016-07-16 DIAGNOSIS — E785 Hyperlipidemia, unspecified: Secondary | ICD-10-CM | POA: Diagnosis not present

## 2016-07-16 DIAGNOSIS — I77810 Thoracic aortic ectasia: Secondary | ICD-10-CM | POA: Insufficient documentation

## 2016-07-16 DIAGNOSIS — Z951 Presence of aortocoronary bypass graft: Secondary | ICD-10-CM | POA: Insufficient documentation

## 2016-07-16 DIAGNOSIS — E669 Obesity, unspecified: Secondary | ICD-10-CM | POA: Insufficient documentation

## 2016-07-16 DIAGNOSIS — I119 Hypertensive heart disease without heart failure: Secondary | ICD-10-CM | POA: Diagnosis not present

## 2016-07-19 ENCOUNTER — Telehealth: Payer: Self-pay | Admitting: *Deleted

## 2016-07-19 DIAGNOSIS — Z79899 Other long term (current) drug therapy: Secondary | ICD-10-CM

## 2016-07-19 DIAGNOSIS — E7800 Pure hypercholesterolemia, unspecified: Secondary | ICD-10-CM

## 2016-07-19 NOTE — Telephone Encounter (Addendum)
-----   Message from Lelon Perla, MD sent at 07/16/2016  9:01 AM EST ----- [facility]  Also we received a copy of his lab work snd his LDH is 165.dr crenshaw would like him to try zetia 10 mg once daily with lipids/liver check in 4 weeks.  Left message for pt to call

## 2016-07-20 MED ORDER — EZETIMIBE 10 MG PO TABS: 10.0000 mg | ORAL_TABLET | Freq: Every day | ORAL | 3 refills | Status: DC

## 2016-07-20 NOTE — Telephone Encounter (Signed)
Returned call to patient. Provided lab results & echo results. zetia ordered & lab slips mailed to patient.

## 2016-07-20 NOTE — Telephone Encounter (Signed)
Follow up     Patient returning nurse call back from yesterday.

## 2016-09-08 ENCOUNTER — Other Ambulatory Visit: Payer: Self-pay | Admitting: Cardiology

## 2016-09-08 NOTE — Telephone Encounter (Signed)
°*  STAT* If patient is at the pharmacy, call can be transferred to refill team.   1. Which medications need to be refilled? (please list name of each medication and dose if known) Enlapril 10mg  ( nneds a new Prescription sent)   2. Which pharmacy/location (including street and city if local pharmacy) is medication to be sent to? [facility]   3. Do they need a 30 day or 90 day supply? [place]

## 2016-09-09 MED ORDER — ENALAPRIL MALEATE 10 MG PO TABS: 10.0000 mg | ORAL_TABLET | Freq: Every day | ORAL | 2 refills | Status: DC

## 2016-09-09 NOTE — Telephone Encounter (Signed)
Rx(s) sent to pharmacy electronically.  

## 2017-01-07 ENCOUNTER — Other Ambulatory Visit: Payer: Self-pay | Admitting: Cardiology

## 2017-06-10 ENCOUNTER — Telehealth: Payer: Self-pay | Admitting: *Deleted

## 2017-06-10 DIAGNOSIS — I712 Thoracic aortic aneurysm, without rupture, unspecified: Secondary | ICD-10-CM

## 2017-06-10 NOTE — Telephone Encounter (Signed)
Left message for pt to call, it is time to repeat CTA of the chest to follow up his thoracic aneurysm and he also needs a follow up appointment with dr Stanford Breed.

## 2017-06-30 NOTE — Telephone Encounter (Signed)
Spoke with pt, CTA scheduled 07-14-17 @ 10 am and then a follow up with dr Stanford Breed same day at 11:40 AM. Patient reports he is having lab work drawn with his PCP tomorrow. He will make sure his kidney function is checked and will make sure to bring a copy to the CT scan.

## 2017-07-01 ENCOUNTER — Telehealth: Payer: Self-pay | Admitting: Cardiology

## 2017-07-01 NOTE — Telephone Encounter (Signed)
New message  Pt verbalized that he is calling for the rn   Order for labs

## 2017-07-01 NOTE — Telephone Encounter (Signed)
Returned call to pt he states that he sis not have lab work done at appt today they were "giving him a hard time so he left" Informed pt that he can have this labwork done anythime next week he will go to the lab there in [place] and have them call when he is there and we can fax lab order at that time

## 2017-07-08 NOTE — Progress Notes (Deleted)
HPI: FU coronary artery disease. He underwent coronary bypass graft in December 2007, with a LIMA to LAD, saphenous vein graft to first diagonal with sequential to the circumflex, saphenous graft to the PDA with sequential to a right posterolateral. A previous Holter monitor showed brief atrial fibrillation. Nuclear study in June of 2013 showed an ejection fraction of 67%. There was diaphragmatic attenuation and apical thinning but no ischemia. Abdominal CT April 2016 showed no aneurysm. CTA November 2017 showed thoracic aortic aneurysm of 4.4 cm. Echo 12/17 showed normal LV function, grade 1 DD, moderate AS with mean gradient 26 mmHg and mild TR. Carotid dopplers 12/17 showed 1-39 bilateral stenosis. CTA 12/18 showed . Since he was last seen,   Current Outpatient Medications  Medication Sig Dispense Refill  . aspirin 81 MG tablet Take 81 mg by mouth daily.      . brimonidine (ALPHAGAN P) 0.1 % SOLN Apply to eye.    . Cholecalciferol (VITAMIN D-3 PO) Take 1 tablet by mouth daily.    . Difluprednate 0.05 % EMUL Apply to eye.    . docusate sodium (COLACE) 100 MG capsule Take 100 mg by mouth daily.    . enalapril (VASOTEC) 10 MG tablet TAKE ONE TABLET EVERY DAY 90 tablet 3  . ezetimibe (ZETIA) 10 MG tablet Take 1 tablet (10 mg total) by mouth daily. 90 tablet 3  . Garlic 2505 MG CAPS Take 1 capsule by mouth daily.     Marland Kitchen ofloxacin (OCUFLOX) 0.3 % ophthalmic solution Apply to eye.    . Red Yeast Rice 600 MG TABS Take 2 tablets by mouth 2 (two) times daily.     . tamsulosin (FLOMAX) 0.4 MG CAPS capsule Take 1 capsule by mouth 2 (two) times daily.    . vitamin E 400 UNIT capsule Take 400 Units by mouth daily.     No current facility-administered medications for this visit.      Past Medical History:  Diagnosis Date  . Aortic stenosis   . Atrial fibrillation ([place])   . BPH (benign prostatic hypertrophy)   . BRADYCARDIA   . CAD    s/p CABG in 2007 x 5  . Carotid stenosis    0-39%  bilaterally  . Complication of anesthesia   . HYPERCHOLESTEROLEMIA    statin intolerant  . HYPERTENSION   . Long term (current) use of anticoagulants   . NEPHROLITHIASIS   . PAF (paroxysmal atrial fibrillation) (HCC)    briefly noted on Holter monitor  . Prostate cancer ([place])   . Skin cancer   . Stroke ([place])   . TIA Feb 2012   no residual  . TIA (transient ischemic attack)     Past Surgical History:  Procedure Laterality Date  . APPENDECTOMY  1999  . cardiac arrest following CABG N/A   . COLONOSCOPY    . CORONARY ARTERY BYPASS GRAFT  2007   x 5  . HERNIA REPAIR Left 1996   inguinal  . JOINT REPLACEMENT    . LITHOTRIPSY  2013  . RADIOACTIVE SEED IMPLANT N/A 01/06/2015   Procedure: RADIOACTIVE SEED IMPLANT/BRACHYTHERAPY IMPLANT;  Surgeon: Hollice Espy, MD;  Location: [facility] ORS;  Service: Urology;  Laterality: N/A;  . TOTAL HIP ARTHROPLASTY Left     Social History   Socioeconomic History  . Marital status: Divorced    Spouse name: Not on file  . Number of children: Not on file  . Years of education: Not on file  .  Highest education level: Not on file  Social Needs  . Financial resource strain: Not on file  . Food insecurity - worry: Not on file  . Food insecurity - inability: Not on file  . Transportation needs - medical: Not on file  . Transportation needs - non-medical: Not on file  Occupational History  . Not on file  Tobacco Use  . Smoking status: Former Smoker    Packs/day: 1.00    Types: Cigarettes    Last attempt to quit: 11/30/1968    Years since quitting: 48.6  . Smokeless tobacco: Former Systems developer    Types: [place] date: 12/26/1987  Substance and Sexual Activity  . Alcohol use: No    Alcohol/week: 0.0 oz  . Drug use: No  . Sexual activity: Not Currently  Other Topics Concern  . Not on file  Social History Narrative  . Not on file    Family History  Problem Relation Age of Onset  . Heart disease Mother   . Heart failure Mother   . Heart  attack Father     ROS: no fevers or chills, productive cough, hemoptysis, dysphasia, odynophagia, melena, hematochezia, dysuria, hematuria, rash, seizure activity, orthopnea, PND, pedal edema, claudication. Remaining systems are negative.  Physical Exam: Well-developed well-nourished in no acute distress.  Skin is warm and dry.  HEENT is normal.  Neck is supple.  Chest is clear to auscultation with normal expansion.  Cardiovascular exam is regular rate and rhythm.  Abdominal exam nontender or distended. No masses palpated. Extremities show no edema. neuro grossly intact  ECG- personally reviewed  A/P  1  Kirk Ruths, MD

## 2017-07-14 ENCOUNTER — Inpatient Hospital Stay: Admission: RE | Admit: 2017-07-14 | Payer: Medicare Other | Source: Ambulatory Visit

## 2017-07-14 ENCOUNTER — Ambulatory Visit: Payer: Medicare Other | Admitting: Cardiology

## 2017-07-14 ENCOUNTER — Other Ambulatory Visit: Payer: Medicare Other

## 2017-07-14 ENCOUNTER — Other Ambulatory Visit: Payer: Self-pay | Admitting: *Deleted

## 2017-07-14 DIAGNOSIS — I2510 Atherosclerotic heart disease of native coronary artery without angina pectoris: Secondary | ICD-10-CM

## 2017-07-15 NOTE — Progress Notes (Signed)
HPI: FU coronary artery disease. He underwent coronary bypass graft in December 2007, with a LIMA to LAD, saphenous vein graft to first diagonal with sequential to the circumflex, saphenous graft to the PDA with sequential to a right posterolateral. A previous Holter monitor showed brief atrial fibrillation. Nuclear study in June of 2013 showed an ejection fraction of 67%. There was diaphragmatic attenuation and apical thinning but no ischemia. Abdominal CT April 2016 showed no aneurysm. Echo 12/17 showed normal LV function, grade 1 DD, moderate AS with mean gradient 26 mmHg and mild TR. Carotid dopplers 12/17 showed 1-39 bilateral stenosis. CTA 12/18 showed showed 4.4 cm thoracic aortic aneurysm. Since he was last seen, patient has dyspnea from an upper respiratory infection. Otherwise no orthopnea, PND, pedal edema, chest pain or syncope.  Current Outpatient Medications  Medication Sig Dispense Refill  . aspirin 81 MG tablet Take 81 mg by mouth daily.      . brimonidine (ALPHAGAN P) 0.1 % SOLN Apply to eye.    . Cholecalciferol (VITAMIN D-3 PO) Take 1 tablet by mouth daily.    . Difluprednate 0.05 % EMUL Apply to eye.    . docusate sodium (COLACE) 100 MG capsule Take 100 mg by mouth daily.    . enalapril (VASOTEC) 10 MG tablet TAKE ONE TABLET EVERY DAY 90 tablet 3  . Garlic 0623 MG CAPS Take 1 capsule by mouth daily.     Marland Kitchen ofloxacin (OCUFLOX) 0.3 % ophthalmic solution Apply to eye.    . Red Yeast Rice 600 MG TABS Take 2 tablets by mouth 2 (two) times daily.     . tamsulosin (FLOMAX) 0.4 MG CAPS capsule Take 1 capsule by mouth 2 (two) times daily.    . vitamin E 400 UNIT capsule Take 400 Units by mouth daily.    Marland Kitchen ezetimibe (ZETIA) 10 MG tablet Take 1 tablet (10 mg total) by mouth daily. 90 tablet 3   No current facility-administered medications for this visit.      Past Medical History:  Diagnosis Date  . Aortic stenosis   . Atrial fibrillation ([place])   . BPH (benign prostatic  hypertrophy)   . BRADYCARDIA   . CAD    s/p CABG in 2007 x 5  . Carotid stenosis    0-39% bilaterally  . Complication of anesthesia   . HYPERCHOLESTEROLEMIA    statin intolerant  . HYPERTENSION   . Long term (current) use of anticoagulants   . NEPHROLITHIASIS   . PAF (paroxysmal atrial fibrillation) (HCC)    briefly noted on Holter monitor  . Prostate cancer ([place])   . Skin cancer   . Stroke ([place])   . TIA Feb 2012   no residual  . TIA (transient ischemic attack)     Past Surgical History:  Procedure Laterality Date  . APPENDECTOMY  1999  . cardiac arrest following CABG N/A   . COLONOSCOPY    . CORONARY ARTERY BYPASS GRAFT  2007   x 5  . HERNIA REPAIR Left 1996   inguinal  . JOINT REPLACEMENT    . LITHOTRIPSY  2013  . RADIOACTIVE SEED IMPLANT N/A 01/06/2015   Procedure: RADIOACTIVE SEED IMPLANT/BRACHYTHERAPY IMPLANT;  Surgeon: Hollice Espy, MD;  Location: [facility] ORS;  Service: Urology;  Laterality: N/A;  . TOTAL HIP ARTHROPLASTY Left     Social History   Socioeconomic History  . Marital status: Divorced    Spouse name: Not on file  . Number of children: Not  on file  . Years of education: Not on file  . Highest education level: Not on file  Social Needs  . Financial resource strain: Not on file  . Food insecurity - worry: Not on file  . Food insecurity - inability: Not on file  . Transportation needs - medical: Not on file  . Transportation needs - non-medical: Not on file  Occupational History  . Not on file  Tobacco Use  . Smoking status: Former Smoker    Packs/day: 1.00    Types: Cigarettes    Last attempt to quit: 11/30/1968    Years since quitting: 48.6  . Smokeless tobacco: Former Systems developer    Types: [place] date: 12/26/1987  Substance and Sexual Activity  . Alcohol use: No    Alcohol/week: 0.0 oz  . Drug use: No  . Sexual activity: Not Currently  Other Topics Concern  . Not on file  Social History Narrative  . Not on file    Family History    Problem Relation Age of Onset  . Heart disease Mother   . Heart failure Mother   . Heart attack Father     ROS: no fevers or chills, productive cough, hemoptysis, dysphasia, odynophagia, melena, hematochezia, dysuria, hematuria, rash, seizure activity, orthopnea, PND, pedal edema, claudication. Remaining systems are negative.  Physical Exam: Well-developed well-nourished in no acute distress.  Skin is warm and dry.  HEENT is normal.  Neck is supple.  Chest is clear to auscultation with normal expansion.  Cardiovascular exam is regular rate and rhythm.  Abdominal exam nontender or distended. No masses palpated. Extremities show no edema. neuro grossly intact  ECG- sinus rhythm with first-degree AV block. Cannot rule out prior septal infarct. Left axis deviation. Nonspecific ST changes.personally reviewed  A/P  1 paroxysmal atrial fibrillation-patient remains in rhythm today. He has declined anticoagulation in the past and understands the higher risk of CVA. Continue aspirin.  2 carotid artery disease-schedule follow-up carotid Dopplers.  3 History of moderate aortic stenosis-plan repeat echocardiogram.  4 hypertension-blood pressure is elevated. However he states typically controlled. Follow and advance enalapril if needed.  5 hyperlipidemia-patient is intolerant to statins. He does not want to consider other lipid medications.  6 coronary artery disease-continue aspirin. Intolerant to statins.  7 thoracic aortic aneurysm-follow-up CTA December 2019.  Kirk Ruths, MD

## 2017-07-22 ENCOUNTER — Other Ambulatory Visit: Payer: Medicare Other | Admitting: *Deleted

## 2017-07-22 ENCOUNTER — Ambulatory Visit (INDEPENDENT_AMBULATORY_CARE_PROVIDER_SITE_OTHER)
Admission: RE | Admit: 2017-07-22 | Discharge: 2017-07-22 | Disposition: A | Discharge status: Home / Self Care | Payer: Medicare Other | Source: Ambulatory Visit | Attending: Cardiology | Admitting: Cardiology

## 2017-07-22 ENCOUNTER — Other Ambulatory Visit: Payer: Self-pay | Admitting: *Deleted

## 2017-07-22 DIAGNOSIS — Z01812 Encounter for preprocedural laboratory examination: Secondary | ICD-10-CM

## 2017-07-22 DIAGNOSIS — I712 Thoracic aortic aneurysm, without rupture, unspecified: Secondary | ICD-10-CM

## 2017-07-22 DIAGNOSIS — I2510 Atherosclerotic heart disease of native coronary artery without angina pectoris: Secondary | ICD-10-CM

## 2017-07-22 LAB — BASIC METABOLIC PANEL
ANION GAP: 5 (ref 5–15)
BUN: 19 mg/dL (ref 6–20)
CALCIUM: 9 mg/dL (ref 8.9–10.3)
CO2: 28 mmol/L (ref 22–32)
CREATININE: 1.47 mg/dL — AB (ref 0.61–1.24)
Chloride: 106 mmol/L (ref 101–111)
GFR calc Af Amer: 50 mL/min — ABNORMAL LOW (ref >60)
GFR, EST NON AFRICAN AMERICAN: 43 mL/min — AB (ref >60)
GLUCOSE: 102 mg/dL — AB (ref 65–99)
Potassium: 4.1 mmol/L (ref 3.5–5.1)
Sodium: 139 mmol/L (ref 135–145)

## 2017-07-22 MED ORDER — IOPAMIDOL (ISOVUE-370) INJECTION 76%
80.0000 mL | Freq: Once | INTRAVENOUS | Status: AC | PRN
  Administered 2017-07-22: 80 mL via INTRAVENOUS

## 2017-07-25 DIAGNOSIS — C4490 Unspecified malignant neoplasm of skin, unspecified: Secondary | ICD-10-CM | POA: Insufficient documentation

## 2017-07-25 DIAGNOSIS — I350 Nonrheumatic aortic (valve) stenosis: Secondary | ICD-10-CM | POA: Insufficient documentation

## 2017-07-25 DIAGNOSIS — I6529 Occlusion and stenosis of unspecified carotid artery: Secondary | ICD-10-CM | POA: Insufficient documentation

## 2017-07-25 DIAGNOSIS — G459 Transient cerebral ischemic attack, unspecified: Secondary | ICD-10-CM | POA: Insufficient documentation

## 2017-07-25 DIAGNOSIS — I639 Cerebral infarction, unspecified: Secondary | ICD-10-CM | POA: Insufficient documentation

## 2017-07-25 DIAGNOSIS — T8859XA Other complications of anesthesia, initial encounter: Secondary | ICD-10-CM | POA: Insufficient documentation

## 2017-07-25 DIAGNOSIS — I4891 Unspecified atrial fibrillation: Secondary | ICD-10-CM | POA: Insufficient documentation

## 2017-07-25 DIAGNOSIS — I480 Paroxysmal atrial fibrillation: Secondary | ICD-10-CM | POA: Insufficient documentation

## 2017-07-25 DIAGNOSIS — Z7901 Long term (current) use of anticoagulants: Secondary | ICD-10-CM | POA: Insufficient documentation

## 2017-07-25 DIAGNOSIS — T4145XA Adverse effect of unspecified anesthetic, initial encounter: Secondary | ICD-10-CM | POA: Insufficient documentation

## 2017-07-25 DIAGNOSIS — C61 Malignant neoplasm of prostate: Secondary | ICD-10-CM | POA: Insufficient documentation

## 2017-07-28 ENCOUNTER — Ambulatory Visit: Payer: Medicare Other | Admitting: Cardiology

## 2017-07-28 ENCOUNTER — Encounter: Payer: Self-pay | Admitting: Cardiology

## 2017-07-28 VITALS — BP 154/74 | HR 57 | Ht 74.0 in | Wt 252.0 lb

## 2017-07-28 DIAGNOSIS — I6523 Occlusion and stenosis of bilateral carotid arteries: Secondary | ICD-10-CM

## 2017-07-28 DIAGNOSIS — E7800 Pure hypercholesterolemia, unspecified: Secondary | ICD-10-CM | POA: Diagnosis not present

## 2017-07-28 DIAGNOSIS — I350 Nonrheumatic aortic (valve) stenosis: Secondary | ICD-10-CM | POA: Diagnosis not present

## 2017-07-28 DIAGNOSIS — I10 Essential (primary) hypertension: Secondary | ICD-10-CM

## 2017-07-28 DIAGNOSIS — I2510 Atherosclerotic heart disease of native coronary artery without angina pectoris: Secondary | ICD-10-CM

## 2017-07-28 NOTE — Patient Instructions (Signed)
Medication Instructions:   NO CHANGE  Testing/Procedures:  Your physician has requested that you have an echocardiogram. Echocardiography is a painless test that uses sound waves to create images of your heart. It provides your doctor with information about the size and shape of your heart and how well your heart's chambers and valves are working. This procedure takes approximately one hour. There are no restrictions for this procedure.   Your physician has requested that you have a carotid duplex. This test is an ultrasound of the carotid arteries in your neck. It looks at blood flow through these arteries that supply the brain with blood. Allow one hour for this exam. There are no restrictions or special instructions.    Follow-Up:  Your physician wants you to follow-up in: ONE YEAR WITH DR CRENSHAW You will receive a reminder letter in the mail two months in advance. If you don't receive a letter, please call our office to schedule the follow-up appointment.   If you need a refill on your cardiac medications before your next appointment, please call your pharmacy.    

## 2017-08-04 ENCOUNTER — Ambulatory Visit (HOSPITAL_COMMUNITY): Payer: Medicare Other | Attending: Cardiology

## 2017-08-04 ENCOUNTER — Other Ambulatory Visit: Payer: Self-pay

## 2017-08-04 DIAGNOSIS — I081 Rheumatic disorders of both mitral and tricuspid valves: Secondary | ICD-10-CM | POA: Diagnosis not present

## 2017-08-04 DIAGNOSIS — I350 Nonrheumatic aortic (valve) stenosis: Secondary | ICD-10-CM

## 2017-08-11 ENCOUNTER — Ambulatory Visit (HOSPITAL_COMMUNITY)
Admission: RE | Admit: 2017-08-11 | Discharge: 2017-08-11 | Disposition: A | Discharge status: Home / Self Care | Payer: Medicare Other | Source: Ambulatory Visit | Attending: Cardiology | Admitting: Cardiology

## 2017-08-11 DIAGNOSIS — I6523 Occlusion and stenosis of bilateral carotid arteries: Secondary | ICD-10-CM | POA: Diagnosis present

## 2017-08-16 ENCOUNTER — Other Ambulatory Visit: Payer: Self-pay | Admitting: *Deleted

## 2017-08-16 ENCOUNTER — Telehealth: Payer: Self-pay | Admitting: *Deleted

## 2017-08-16 DIAGNOSIS — I679 Cerebrovascular disease, unspecified: Secondary | ICD-10-CM

## 2017-08-16 NOTE — Telephone Encounter (Signed)
Patient here for testing and brought by the following bp readings, 140/93,118/69,100/60,152/85,138/85,166/102 and 134/72. Dr Stanford Breed reviewed and I left a message for the patient, per dr Stanford Breed no change in medications at this time.

## 2018-02-22 ENCOUNTER — Other Ambulatory Visit: Payer: Self-pay | Admitting: Orthopedic Surgery

## 2018-02-22 DIAGNOSIS — M47816 Spondylosis without myelopathy or radiculopathy, lumbar region: Secondary | ICD-10-CM

## 2018-02-28 ENCOUNTER — Ambulatory Visit
Admission: RE | Admit: 2018-02-28 | Discharge: 2018-02-28 | Disposition: A | Discharge status: Home / Self Care | Payer: Medicare Other | Source: Ambulatory Visit | Attending: Orthopedic Surgery | Admitting: Orthopedic Surgery

## 2018-02-28 DIAGNOSIS — M47816 Spondylosis without myelopathy or radiculopathy, lumbar region: Secondary | ICD-10-CM | POA: Diagnosis present

## 2018-02-28 DIAGNOSIS — R600 Localized edema: Secondary | ICD-10-CM | POA: Insufficient documentation

## 2018-02-28 DIAGNOSIS — M48061 Spinal stenosis, lumbar region without neurogenic claudication: Secondary | ICD-10-CM | POA: Insufficient documentation

## 2018-02-28 DIAGNOSIS — M5126 Other intervertebral disc displacement, lumbar region: Secondary | ICD-10-CM | POA: Insufficient documentation

## 2018-03-24 ENCOUNTER — Other Ambulatory Visit: Payer: Self-pay | Admitting: Cardiology

## 2018-06-19 ENCOUNTER — Telehealth: Payer: Self-pay | Admitting: Cardiology

## 2018-06-19 DIAGNOSIS — I10 Essential (primary) hypertension: Secondary | ICD-10-CM

## 2018-06-19 MED ORDER — ENALAPRIL MALEATE 20 MG PO TABS: 20.0000 mg | ORAL_TABLET | Freq: Every day | ORAL | 3 refills | Status: DC

## 2018-06-19 NOTE — Telephone Encounter (Signed)
Spoke to patient , patient states for the last 2 months or so blood pressure has been increasing.  Patient states blood pressure has been  140-150 range, he has been taking it at least twice a day.   this morning  Blood pressure was 195/109  , 164 /108    @ 2 day ago  191/106.    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 Patient has annual appointment schedule for 08/21/18.  Aware will defer to Dr Stanford Breed and contact patient back.

## 2018-06-19 NOTE — Telephone Encounter (Signed)
Spoke with pt, Aware of dr Jacalyn Lefevre recommendations. New script mailed to the patient for mail order. Lab orders mailed to the pt

## 2018-06-19 NOTE — Telephone Encounter (Signed)
Change enalapril to 20 mg daily.  Check potassium and renal function in 1 week.  Follow blood pressure. Kirk Ruths, MD

## 2018-06-19 NOTE — Telephone Encounter (Signed)
New Message   Pt c/o BP issue:  1. What are your last 5 BP readings? 195/109,164/108, 191/106  2. Are you having any other symptoms (ex. Dizziness, headache, blurred vision, passed out)? No symptoms 3. What is your medication issue? None

## 2018-06-27 ENCOUNTER — Other Ambulatory Visit: Payer: Self-pay

## 2018-06-27 DIAGNOSIS — I10 Essential (primary) hypertension: Secondary | ICD-10-CM

## 2018-06-27 MED ORDER — ENALAPRIL MALEATE 20 MG PO TABS: 20.0000 mg | ORAL_TABLET | Freq: Every day | ORAL | 0 refills | Status: DC

## 2018-06-27 MED ORDER — ENALAPRIL MALEATE 20 MG PO TABS
20.0000 mg | ORAL_TABLET | Freq: Every day | ORAL | 0 refills | Status: DC
Start: 2018-06-27 — End: 2018-06-27

## 2018-07-18 ENCOUNTER — Telehealth: Payer: Self-pay | Admitting: *Deleted

## 2018-07-18 ENCOUNTER — Other Ambulatory Visit: Payer: Self-pay | Admitting: *Deleted

## 2018-07-18 ENCOUNTER — Encounter: Payer: Self-pay | Admitting: *Deleted

## 2018-07-18 DIAGNOSIS — I712 Thoracic aortic aneurysm, without rupture, unspecified: Secondary | ICD-10-CM

## 2018-07-18 NOTE — Telephone Encounter (Signed)
This encounter was created in error - please disregard.

## 2018-07-18 NOTE — Telephone Encounter (Signed)
CTA of the chest w/wo scheduled 08-21-18 to follow up on thoracic aneurysm at the [address] office @ 11:30 am. Patient aware to have no solid foods 2 hours prior to the scan. Patient reports having lab work done with his medical doctor recently. Spoke with dr Raylene Miyamoto office, they will fax the most recent labs.

## 2018-07-20 NOTE — Telephone Encounter (Signed)
07-07-2018 lab received from PCP, BUN=23 and CR=1.5. dr Stanford Breed reviewed and patient instructed to increase fluid intake prior to scan.

## 2018-08-15 NOTE — Progress Notes (Signed)
HPI: FU coronary artery disease. He underwent coronary bypass graft in December 2007, with a LIMA to LAD, saphenous vein graft to first diagonal with sequential to the circumflex, saphenous graft to the PDA with sequential to a right posterolateral. A previous Holter monitor showed brief atrial fibrillation. Nuclear study in June of 2013 showed an ejection fraction of 67%. There was diaphragmatic attenuation and apical thinning but no ischemia. Abdominal CT April 2016 showed no aneurysm. CTA 12/18 showed showed 4.4 cm thoracic aortic aneurysm. Echo 1/19 showed normal LV function, grade 1 DD, moderate AS, (mean gradient 25 mmHg), trace AI, ascending aorta 45 mm, mild MR, biatrial enlargement. Carotid dopplers 1/19 showed 1-39 bilateral stenosis. Since he was last seen,there is no dyspnea, chest pain, palpitations or syncope.  Current Outpatient Medications  Medication Sig Dispense Refill  . aspirin 81 MG tablet Take 81 mg by mouth daily.      . brimonidine (ALPHAGAN P) 0.1 % SOLN Place 1 drop into both eyes daily.     . Cholecalciferol (VITAMIN D-3 PO) Take 1 tablet by mouth daily.    . enalapril (VASOTEC) 20 MG tablet Take 1 tablet (20 mg total) by mouth daily. Please keep upcoming appointment for further refills 90 tablet 0  . ezetimibe (ZETIA) 10 MG tablet Take 1 tablet (10 mg total) by mouth daily. 90 tablet 3  . Garlic 7654 MG CAPS Take 1 capsule by mouth daily.     . hydrochlorothiazide (HYDRODIURIL) 25 MG tablet Take 1 tablet by mouth daily.    . tamsulosin (FLOMAX) 0.4 MG CAPS capsule Take 1 capsule by mouth 2 (two) times daily.    . vitamin E 400 UNIT capsule Take 400 Units by mouth daily.     No current facility-administered medications for this visit.      Past Medical History:  Diagnosis Date  . Aortic stenosis   . Atrial fibrillation ([place])   . BPH (benign prostatic hypertrophy)   . BRADYCARDIA   . CAD    s/p CABG in 2007 x 5  . Carotid stenosis    0-39% bilaterally    . Complication of anesthesia   . HYPERCHOLESTEROLEMIA    statin intolerant  . HYPERTENSION   . Long term (current) use of anticoagulants   . NEPHROLITHIASIS   . PAF (paroxysmal atrial fibrillation) (HCC)    briefly noted on Holter monitor  . Prostate cancer ([place])   . Skin cancer   . Stroke ([place])   . TIA Feb 2012   no residual  . TIA (transient ischemic attack)     Past Surgical History:  Procedure Laterality Date  . APPENDECTOMY  1999  . cardiac arrest following CABG N/A   . COLONOSCOPY    . CORONARY ARTERY BYPASS GRAFT  2007   x 5  . HERNIA REPAIR Left 1996   inguinal  . JOINT REPLACEMENT    . LITHOTRIPSY  2013  . RADIOACTIVE SEED IMPLANT N/A 01/06/2015   Procedure: RADIOACTIVE SEED IMPLANT/BRACHYTHERAPY IMPLANT;  Surgeon: Hollice Espy, MD;  Location: [facility] ORS;  Service: Urology;  Laterality: N/A;  . TOTAL HIP ARTHROPLASTY Left     Social History   Socioeconomic History  . Marital status: Divorced    Spouse name: Not on file  . Number of children: Not on file  . Years of education: Not on file  . Highest education level: Not on file  Occupational History  . Not on file  Social Needs  . Financial  resource strain: Not on file  . Food insecurity:    Worry: Not on file    Inability: Not on file  . Transportation needs:    Medical: Not on file    Non-medical: Not on file  Tobacco Use  . Smoking status: Former Smoker    Packs/day: 1.00    Types: Cigarettes    Last attempt to quit: 11/30/1968    Years since quitting: 49.7  . Smokeless tobacco: Former Systems developer    Types: [place] date: 12/26/1987  Substance and Sexual Activity  . Alcohol use: No    Alcohol/week: 0.0 standard drinks  . Drug use: No  . Sexual activity: Not Currently  Lifestyle  . Physical activity:    Days per week: Not on file    Minutes per session: Not on file  . Stress: Not on file  Relationships  . Social connections:    Talks on phone: Not on file    Gets together: Not on file     Attends religious service: Not on file    Active member of club or organization: Not on file    Attends meetings of clubs or organizations: Not on file    Relationship status: Not on file  . Intimate partner violence:    Fear of current or ex partner: Not on file    Emotionally abused: Not on file    Physically abused: Not on file    Forced sexual activity: Not on file  Other Topics Concern  . Not on file  Social History Narrative  . Not on file    Family History  Problem Relation Age of Onset  . Heart disease Mother   . Heart failure Mother   . Heart attack Father     ROS: Low back pain but no fevers or chills, productive cough, hemoptysis, dysphasia, odynophagia, melena, hematochezia, dysuria, hematuria, rash, seizure activity, orthopnea, PND, pedal edema, claudication. Remaining systems are negative.  Physical Exam: Well-developed well-nourished in no acute distress.  Skin is warm and dry.  HEENT is normal.  Neck is supple.  Chest is clear to auscultation with normal expansion.  Cardiovascular exam is regular rate and rhythm. 2/6 systolic murmur Abdominal exam nontender or distended. No masses palpated. Extremities show no edema. neuro grossly intact  ECG-sinus rhythm at a rate of 78, first-degree AV block, left axis deviation.  Personally reviewed  A/P  1 paroxysmal atrial fibrillation-patient remains in sinus rhythm today.  He continues to decline anticoagulation and understands the higher risk of CVA.  2 coronary artery disease-continue aspirin.  Patient is intolerant to statins.  3 hyperlipidemia-intolerant to statins.  Not willing to consider repatha.  Continue zetia and diet.  4 hypertension-blood pressure is controlled.  Continue present medications.  5 thoracic aortic aneurysm-arrange follow-up CTA.  6 aortic stenosis-repeat echocardiogram.  7 carotid artery disease-continue aspirin.  Intolerant to statins.  He had follow-up carotid Dopplers today and  will await results.  Kirk Ruths, MD

## 2018-08-21 ENCOUNTER — Ambulatory Visit (INDEPENDENT_AMBULATORY_CARE_PROVIDER_SITE_OTHER)
Admission: RE | Admit: 2018-08-21 | Discharge: 2018-08-21 | Disposition: A | Discharge status: Home / Self Care | Payer: Medicare Other | Source: Ambulatory Visit | Attending: Cardiology | Admitting: Cardiology

## 2018-08-21 ENCOUNTER — Ambulatory Visit (HOSPITAL_COMMUNITY)
Admission: RE | Admit: 2018-08-21 | Discharge: 2018-08-21 | Disposition: A | Discharge status: Home / Self Care | Payer: Medicare Other | Source: Ambulatory Visit | Attending: Internal Medicine | Admitting: Internal Medicine

## 2018-08-21 ENCOUNTER — Encounter: Payer: Self-pay | Admitting: Cardiology

## 2018-08-21 ENCOUNTER — Ambulatory Visit: Payer: Medicare Other | Admitting: Cardiology

## 2018-08-21 VITALS — BP 126/62 | HR 78 | Ht 74.0 in | Wt 249.0 lb

## 2018-08-21 DIAGNOSIS — I712 Thoracic aortic aneurysm, without rupture, unspecified: Secondary | ICD-10-CM

## 2018-08-21 DIAGNOSIS — E7800 Pure hypercholesterolemia, unspecified: Secondary | ICD-10-CM

## 2018-08-21 DIAGNOSIS — I679 Cerebrovascular disease, unspecified: Secondary | ICD-10-CM | POA: Diagnosis not present

## 2018-08-21 DIAGNOSIS — I2510 Atherosclerotic heart disease of native coronary artery without angina pectoris: Secondary | ICD-10-CM | POA: Diagnosis not present

## 2018-08-21 DIAGNOSIS — I350 Nonrheumatic aortic (valve) stenosis: Secondary | ICD-10-CM | POA: Diagnosis not present

## 2018-08-21 DIAGNOSIS — I10 Essential (primary) hypertension: Secondary | ICD-10-CM | POA: Diagnosis not present

## 2018-08-21 MED ORDER — IOPAMIDOL (ISOVUE-370) INJECTION 76%
80.0000 mL | Freq: Once | INTRAVENOUS | Status: AC | PRN
  Administered 2018-08-21: 80 mL via INTRAVENOUS

## 2018-08-21 NOTE — Patient Instructions (Signed)
Medication Instructions:  NO CHANGE If you need a refill on your cardiac medications before your next appointment, please call your pharmacy.   Lab work: If you have labs (blood work) drawn today and your tests are completely normal, you will receive your results only by: Marland Kitchen MyChart Message (if you have MyChart) OR . A paper copy in the mail If you have any lab test that is abnormal or we need to change your treatment, we will call you to review the results.  Testing/Procedures: Your physician has requested that you have an echocardiogram. Echocardiography is a painless test that uses sound waves to create images of your heart. It provides your doctor with information about the size and shape of your heart and how well your heart's chambers and valves are working. This procedure takes approximately one hour. There are no restrictions for this procedure.  [place]  Follow-Up: At [facility], you and your health needs are our priority.  As part of our continuing mission to provide you with exceptional heart care, we have created designated Provider Care Teams.  These Care Teams include your primary Cardiologist (physician) and Advanced Practice Providers (APPs -  Physician Assistants and Nurse Practitioners) who all work together to provide you with the care you need, when you need it. You will need a follow up appointment in 12 months.  Please call our office 2 months in advance to schedule this appointment.  You may see No primary care provider on file.Kirk Ruths MD or one of the following Advanced Practice Providers on your designated Care Team:   Kerin Ransom, PA-C Roby Lofts, [place] . Sande Rives, [place]  CALL IN November TO SCHEDULE APPOINTMENT IN January 2021

## 2018-08-24 ENCOUNTER — Ambulatory Visit (HOSPITAL_COMMUNITY): Payer: Medicare Other | Attending: Cardiovascular Disease

## 2018-08-24 ENCOUNTER — Other Ambulatory Visit: Payer: Self-pay

## 2018-08-24 DIAGNOSIS — I350 Nonrheumatic aortic (valve) stenosis: Secondary | ICD-10-CM | POA: Insufficient documentation

## 2018-08-28 ENCOUNTER — Other Ambulatory Visit: Payer: Self-pay | Admitting: Cardiology

## 2018-08-28 DIAGNOSIS — I10 Essential (primary) hypertension: Secondary | ICD-10-CM

## 2019-02-23 ENCOUNTER — Telehealth: Payer: Self-pay

## 2019-02-23 NOTE — Telephone Encounter (Signed)
   [facility] Pre-operative Risk Assessment    Request for surgical clearance:  1. What type of surgery is being performed? Lumbar   2. When is this surgery scheduled? TBD   3. What type of clearance is required (medical clearance vs. Pharmacy clearance to hold med vs. Both)? Both  4. Are there any medications that need to be held prior to surgery and how long?ASA   5. Practice name and name of physician performing surgery? [facility]    6. What is your office phone number  [phone]   7.   What is your office fax number [phone]  8.   Anesthesia type (None, local, MAC, general) ? Unkown   Meryl Crutch 02/23/2019, 8:08 AM  _________________________________________________________________   (provider comments below)

## 2019-02-23 NOTE — Telephone Encounter (Addendum)
   Primary Cardiologist: Kirk Ruths, MD  Chart reviewed as part of pre-operative protocol coverage. Patient was contacted 02/23/2019 in reference to pre-operative risk assessment for pending surgery as outlined below.  Sergio Diona Foley. was last seen on 08/21/2018 by Dr Stanford Breed.  Since that day, Sergio [place]. has done well.  Therefore, based on ACC/AHA guidelines, the patient would be at acceptable risk for the planned procedure without further cardiovascular testing.   I will route this recommendation to the requesting party via Epic fax function and remove from pre-op pool.  Please call with questions.  Rosaria Ferries, PA-C 02/23/2019, 3:52 PM   Addendum: I was advised the patient be okay to hold his aspirin for 5-7 days prior to the planned procedure   [facility]    office phone number  [phone]  office fax number [phone]

## 2019-06-13 ENCOUNTER — Telehealth: Payer: Self-pay | Admitting: Cardiology

## 2019-06-13 NOTE — Telephone Encounter (Signed)
Spoke with pt who states he is to have back surgery and surgeon is Dr. Ellene Route at [facility]. Pt states he saw Dr. Ellene Route today and he told him that he would reach out to Dr. Jacalyn Lefevre office for cardiac clearance. Pt states he has not seen Dr. Stanford Breed in a year and he was calling to see if he can be set up for appt to expedite cardiac clearance. Informed pt that next available appt in January 2021 but encounter to be routed to pre-op pool. Pt verbalized understanding

## 2019-06-13 NOTE — Telephone Encounter (Signed)
New Message      Pt is calling and says he is suppose to have back surgery. It is not scheduled but he says he is wanting to be cleared so he can have the procedure done     Please call

## 2019-06-14 NOTE — Telephone Encounter (Signed)
New Message:       I left a message on pt's home phone number to call and schedule an appointment with Dr Jacalyn Lefevre APP for surgical clearance.

## 2019-06-15 ENCOUNTER — Telehealth: Payer: Self-pay

## 2019-06-15 NOTE — Telephone Encounter (Signed)
   Primary Cardiologist: Kirk Ruths, MD  Chart reviewed as part of pre-operative protocol coverage. Prior phone notes reviewed. It appears a pre-op clearance appointment has already been requested and arranged for 06/25/19 for this procedure. Per office protocol, the provider should assess clearance at time of office visit and should forward their finalized clearance decision to requesting party below. I will cc to Angie to make her aware (appointment notes already say surgical clearance). I will remove this message from the pre-op box.  Stein Pitter, PA-C 06/15/2019, 4:30 PM

## 2019-06-15 NOTE — Telephone Encounter (Signed)
   [facility] Pre-operative Risk Assessment    Request for surgical clearance:  1. What type of surgery is being performed? L 2-3 L 3-4 L 4-5   2. When is this surgery scheduled? TBD  3. What type of clearance is required Both  4. Are there any medications that need to be held prior to surgery and how long? Aspirin  5. Practice name and name of physician performing surgery?  [facility]  6. What is your office phone number [phone]   7.   What is your office fax number [phone]  8.   Anesthesia type General   Kathyrn Lass 06/15/2019, 3:39 PM  _________________________________________________________________   (provider comments below)

## 2019-06-20 ENCOUNTER — Telehealth: Payer: Self-pay | Admitting: *Deleted

## 2019-06-20 ENCOUNTER — Other Ambulatory Visit (HOSPITAL_COMMUNITY): Payer: Self-pay | Admitting: Neurological Surgery

## 2019-06-20 ENCOUNTER — Other Ambulatory Visit: Payer: Self-pay | Admitting: Neurological Surgery

## 2019-06-20 DIAGNOSIS — M48062 Spinal stenosis, lumbar region with neurogenic claudication: Secondary | ICD-10-CM

## 2019-06-24 NOTE — Progress Notes (Addendum)
Cardiology Office Note:    Date:  06/25/2019   ID:  Sergio Foley., DOB 03-Jul-1934, MRN [id number]  PCP:  Patient, No Pcp Per  Cardiologist:  Kirk Ruths, MD   Referring MD: Dion Body, MD   Chief Complaint  Patient presents with   Pre-op Exam    History of Present Illness:    [name]. is a 85 y.o. male with a hx of CAD s/p CABG 2007 (LIMA-LAD, SVG-D1-Cx, SVG-PDA-right posterolateral), no reversible ischemia on nuclear stress test 2013, CTA 07/2017 4.4 cm thoracic aortic aneurysm, and echo 08/2017 with normal EF, grade 1 DD, moderate AS, ascending aorta 45 mm, mild MR and biatrial enlargement. H ealso has HTN, HLD, and hx of stroke. He as last seen in clinic on 08/21/18 with Dr. Stanford Breed and was doing well at that time. Dr. Stanford Breed noted a holter monitor showed brief Afib (unsure of date).   He presents today for preoperative evaluation. He denies chest pain, but can't walk farther than about 25 yards. He is unable to walk up a hill or flight of stairs. It sounds as though he is primarily limited by back pain; however, he also complains of DOE. Given that his last stress test was 7 years ago, I will order lexiscan myoview. He has annual echos for AS and AAA - I will order a repeat echo as well.    Past Medical History:  Diagnosis Date   Aortic stenosis    Atrial fibrillation (HCC)    BPH (benign prostatic hypertrophy)    BRADYCARDIA    CAD    s/p CABG in 2007 x 5   Carotid stenosis    0-39% bilaterally   Complication of anesthesia    HYPERCHOLESTEROLEMIA    statin intolerant   HYPERTENSION    Long term (current) use of anticoagulants    NEPHROLITHIASIS    PAF (paroxysmal atrial fibrillation) ([place])    briefly noted on Holter monitor   Prostate cancer ([place])    Skin cancer    Stroke [facility])    TIA Feb 2012   no residual   TIA (transient ischemic attack)     Past Surgical History:  Procedure Laterality Date   APPENDECTOMY   1999   cardiac arrest following CABG N/A    COLONOSCOPY     CORONARY ARTERY BYPASS GRAFT  2007   x 5   HERNIA REPAIR Left 1996   inguinal   JOINT REPLACEMENT     LITHOTRIPSY  2013   RADIOACTIVE SEED IMPLANT N/A 01/06/2015   Procedure: RADIOACTIVE SEED IMPLANT/BRACHYTHERAPY IMPLANT;  Surgeon: Hollice Espy, MD;  Location: [facility] ORS;  Service: Urology;  Laterality: N/A;   TOTAL HIP ARTHROPLASTY Left     Current Medications: Current Meds  Medication Sig   aspirin 81 MG tablet Take 81 mg by mouth daily.     brimonidine (ALPHAGAN P) 0.1 % SOLN Place 1 drop into both eyes daily.    Cholecalciferol (VITAMIN D-3 PO) Take 1 tablet by mouth daily.   enalapril (VASOTEC) 20 MG tablet TAKE 1 TABLET BY MOUTH  DAILY. PLEASE KEEP UPCOMING APPOINTMENT FOR FURTHER  REFILLS   ezetimibe (ZETIA) 10 MG tablet Take 1 tablet (10 mg total) by mouth daily.   Garlic 123XX123 MG CAPS Take 1 capsule by mouth daily.    hydrochlorothiazide (HYDRODIURIL) 25 MG tablet Take 1 tablet by mouth daily.   tamsulosin (FLOMAX) 0.4 MG CAPS capsule Take 1 capsule by mouth 2 (two) times daily.  vitamin E 400 UNIT capsule Take 400 Units by mouth daily.     Allergies:   Metronidazole, Procaine, and Statins   Social History   Socioeconomic History   Marital status: Divorced    Spouse name: Not on file   Number of children: Not on file   Years of education: Not on file   Highest education level: Not on file  Occupational History   Not on file  Social Needs   Financial resource strain: Not on file   Food insecurity    Worry: Not on file    Inability: Not on file   Transportation needs    Medical: Not on file    Non-medical: Not on file  Tobacco Use   Smoking status: Former Smoker    Packs/day: 1.00    Types: Cigarettes    Quit date: 11/30/1968    Years since quitting: 50.6   Smokeless tobacco: Former Systems developer    Types: Chew    Quit date: 12/26/1987  Substance and Sexual Activity   Alcohol  use: No    Alcohol/week: 0.0 standard drinks   Drug use: No   Sexual activity: Not Currently  Lifestyle   Physical activity    Days per week: Not on file    Minutes per session: Not on file   Stress: Not on file  Relationships   Social connections    Talks on phone: Not on file    Gets together: Not on file    Attends religious service: Not on file    Active member of club or organization: Not on file    Attends meetings of clubs or organizations: Not on file    Relationship status: Not on file  Other Topics Concern   Not on file  Social History Narrative   Not on file     Family History: The patient's family history includes Heart attack in his father; Heart disease in his mother; Heart failure in his mother.  ROS:   Please see the history of present illness.     All other systems reviewed and are negative.  EKGs/Labs/Other Studies Reviewed:    The following studies were reviewed today:  Echo 08/24/18: Study Conclusions  - Left ventricle: The cavity size was normal. There was moderate   concentric hypertrophy of the septum. Systolic function was   normal. The estimated ejection fraction was in the range of 60%   to 65%. Wall motion was normal; there were no regional wall   motion abnormalities. Doppler parameters are consistent with   abnormal left ventricular relaxation (grade 1 diastolic   dysfunction). - Ventricular septum: Septal motion showed paradox. - Aortic valve: Valve mobility was moderately restricted. Valve   area (VTI): 1.27 cm^2. Valve area (Vmax): 1.09 cm^2. Valve area   (Vmean): 1.29 cm^2. - Mitral valve: Valve area by pressure half-time: 2.22 cm^2. - Left atrium: The atrium was moderately dilated.  Impressions:  - Aortic stenosis has worsened slightly since last year, but   remains moderate. Ascending aorta dilation appears unchanged.  EKG:  EKG is ordered today.  The ekg ordered today demonstrates sinus bradycardia HR 59 with iRBBB,  poor R wave progression, first degree heart block  Recent Labs: No results found for requested labs within last 8760 hours.  Recent Lipid Panel    Component Value Date/Time   CHOL 216 (H) 01/11/2012 0902   TRIG 198.0 (H) 01/11/2012 0902   HDL 38.90 (L) 01/11/2012 0902   CHOLHDL 6 01/11/2012 0902  VLDL 39.6 01/11/2012 0902   LDLCALC 134 (H) 06/11/2008 0752   LDLDIRECT 157.0 01/11/2012 0902    Physical Exam:    VS:  BP 122/86    Pulse (!) 59    Temp (!) 97.3 F (36.3 C)    Ht 6\' 2"  (1.88 m)    Wt 244 lb 3.2 oz (110.8 kg)    SpO2 99%    BMI 31.35 kg/m     Wt Readings from Last 3 Encounters:  06/25/19 244 lb 3.2 oz (110.8 kg)  08/21/18 249 lb (112.9 kg)  07/28/17 252 lb (114.3 kg)     GEN: Well nourished, well developed in no acute distress HEENT: Normal NECK: No JVD; No carotid bruits LYMPHATICS: No lymphadenopathy CARDIAC: RRR, 4/6 systolic murmur, no rubs, gallops RESPIRATORY:  Clear to auscultation without rales, wheezing or rhonchi  ABDOMEN: Soft, non-tender, non-distended MUSCULOSKELETAL:  No edema; No deformity  SKIN: Warm and dry NEUROLOGIC:  Alert and oriented x 3 PSYCHIATRIC:  Normal affect   ASSESSMENT:    1. Atherosclerosis of native coronary artery of native heart without angina pectoris   2. Aortic valve stenosis, etiology of cardiac valve disease unspecified   3. Paroxysmal atrial fibrillation (HCC)   4. Ascending aortic aneurysm ([place])   5. TIA (transient ischemic attack)   6. Essential hypertension   7. Pure hypercholesterolemia   8. Preoperative clearance    PLAN:    In order of problems listed above:  Atherosclerosis of native coronary artery of native heart without angina pectoris - Plan: EKG 12-Lead, Myocardial Perfusion Imaging  Aortic valve stenosis, etiology of cardiac valve disease unspecified - Plan: EKG 12-Lead, ECHOCARDIOGRAM COMPLETE  Paroxysmal atrial fibrillation (HCC)  Ascending aortic aneurysm (HCC)  TIA (transient ischemic  attack)  Essential hypertension  Pure hypercholesterolemia  Preoperative clearance  Paroxsymal atrial fibrillation - hx of stroke - declines anticoagulation - EKG with sinus rhythm, first degree heart block   CAD  - continue ASA - no beta blocker for sinus bradycardia - denies chest pain, but does have SOB related to back pain if he "does too much"   Hyperlipidemia - intolerant to statins.  LDL in 2009 was 134 - needs a new lipid panel - not willing to consider PCSK9i   Hypertension - pressure well-controlled today - no medication changes   Moderate aortic stenosis - last echo 08/24/18 with slight progression of AS, but still moderate per gradients   Ascending aortic aneurysm - last imaging with 4.5 cm enlargement - repeat echo as above   Preoperative evaluation for cardiac risk  Mr. Millay has done well since his CABG. Last stress test was 2013. It sounds as though he is primarily limited by back pain. However, he is unable to achieve 4.0 METS and does report some shortness of breath. I will evaluate with lexiscan myoview and echocardiogram to monitor AS and AAA. He will have his stress test next Wed and echo on Friday. Once these are completed, I will fax our recommendations to the requesting surgeon. Given his co-morbidities, he understands he is at least moderate risk for surgery, but wishes to proceed.    Follow up with Dr. Stanford Breed in Jan 2021 as planned.  Medication Adjustments/Labs and Tests Ordered: Current medicines are reviewed at length with the patient today.  Concerns regarding medicines are outlined above.  Orders Placed This Encounter  Procedures   Myocardial Perfusion Imaging   EKG 12-Lead   ECHOCARDIOGRAM COMPLETE   No orders of the defined types  were placed in this encounter.   Signed, Ledora Bottcher, [place]  06/25/2019 12:24 PM    [facility]

## 2019-06-25 ENCOUNTER — Other Ambulatory Visit: Payer: Self-pay

## 2019-06-25 ENCOUNTER — Ambulatory Visit: Payer: Medicare Other | Admitting: Physician Assistant

## 2019-06-25 ENCOUNTER — Encounter: Payer: Self-pay | Admitting: Cardiology

## 2019-06-25 ENCOUNTER — Encounter: Payer: Self-pay | Admitting: Physician Assistant

## 2019-06-25 VITALS — BP 122/86 | HR 59 | Temp 97.3°F | Ht 74.0 in | Wt 244.2 lb

## 2019-06-25 DIAGNOSIS — I350 Nonrheumatic aortic (valve) stenosis: Secondary | ICD-10-CM

## 2019-06-25 DIAGNOSIS — I10 Essential (primary) hypertension: Secondary | ICD-10-CM

## 2019-06-25 DIAGNOSIS — I712 Thoracic aortic aneurysm, without rupture: Secondary | ICD-10-CM

## 2019-06-25 DIAGNOSIS — I7121 Aneurysm of the ascending aorta, without rupture: Secondary | ICD-10-CM

## 2019-06-25 DIAGNOSIS — Z01818 Encounter for other preprocedural examination: Secondary | ICD-10-CM

## 2019-06-25 DIAGNOSIS — G459 Transient cerebral ischemic attack, unspecified: Secondary | ICD-10-CM

## 2019-06-25 DIAGNOSIS — I480 Paroxysmal atrial fibrillation: Secondary | ICD-10-CM | POA: Diagnosis not present

## 2019-06-25 DIAGNOSIS — I2510 Atherosclerotic heart disease of native coronary artery without angina pectoris: Secondary | ICD-10-CM | POA: Diagnosis not present

## 2019-06-25 DIAGNOSIS — E7800 Pure hypercholesterolemia, unspecified: Secondary | ICD-10-CM

## 2019-06-25 NOTE — Patient Instructions (Signed)
Medication Instructions:  Continue current medications  *If you need a refill on your cardiac medications before your next appointment, please call your pharmacy*  Lab Work: None Ordered  If you have labs (blood work) drawn today and your tests are completely normal, you will receive your results only by: Marland Kitchen MyChart Message (if you have MyChart) OR . A paper copy in the mail If you have any lab test that is abnormal or we need to change your treatment, we will call you to review the results.  Testing/Procedures: Your physician has requested that you have an echocardiogram. Echocardiography is a painless test that uses sound waves to create images of your heart. It provides your doctor with information about the size and shape of your heart and how well your heart's chambers and valves are working. This procedure takes approximately one hour. There are no restrictions for this procedure.  Your physician has requested that you have a lexiscan myoview. For further information please visit [email or web address]. Please follow instruction sheet, as given.  Follow-Up: At [facility], you and your health needs are our priority.  As part of our continuing mission to provide you with exceptional heart care, we have created designated Provider Care Teams.  These Care Teams include your primary Cardiologist (physician) and Advanced Practice Providers (APPs -  Physician Assistants and Nurse Practitioners) who all work together to provide you with the care you need, when you need it.  Your next appointment:   2 month(s)  The format for your next appointment:   In Person  Provider:   Kirk Ruths, MD

## 2019-06-27 ENCOUNTER — Telehealth (HOSPITAL_COMMUNITY): Payer: Self-pay

## 2019-06-27 ENCOUNTER — Other Ambulatory Visit: Payer: Self-pay

## 2019-06-27 ENCOUNTER — Ambulatory Visit (HOSPITAL_COMMUNITY)
Admission: RE | Admit: 2019-06-27 | Discharge: 2019-06-27 | Disposition: A | Discharge status: Home / Self Care | Payer: Medicare Other | Source: Ambulatory Visit | Attending: Neurological Surgery | Admitting: Neurological Surgery

## 2019-06-27 DIAGNOSIS — M48062 Spinal stenosis, lumbar region with neurogenic claudication: Secondary | ICD-10-CM | POA: Insufficient documentation

## 2019-06-27 NOTE — Telephone Encounter (Signed)
Encounter complete. 

## 2019-07-02 ENCOUNTER — Telehealth (HOSPITAL_COMMUNITY): Payer: Self-pay | Admitting: *Deleted

## 2019-07-02 NOTE — Telephone Encounter (Signed)
Patient given detailed instructions per Myocardial Perfusion Study Information Sheet for the test on 07/06/19. Patient notified to arrive 15 minutes early and that it is imperative to arrive on time for appointment to keep from having the test rescheduled.  If you need to cancel or reschedule your appointment, please call the office within 24 hours of your appointment. . Patient verbalized understanding. Kirstie Peri

## 2019-07-04 ENCOUNTER — Encounter (HOSPITAL_COMMUNITY): Payer: Medicare Other

## 2019-07-06 ENCOUNTER — Ambulatory Visit (HOSPITAL_BASED_OUTPATIENT_CLINIC_OR_DEPARTMENT_OTHER): Payer: Medicare Other

## 2019-07-06 ENCOUNTER — Other Ambulatory Visit: Payer: Self-pay

## 2019-07-06 ENCOUNTER — Ambulatory Visit (HOSPITAL_COMMUNITY): Payer: Medicare Other | Attending: Cardiology

## 2019-07-06 DIAGNOSIS — I2510 Atherosclerotic heart disease of native coronary artery without angina pectoris: Secondary | ICD-10-CM | POA: Diagnosis not present

## 2019-07-06 DIAGNOSIS — I350 Nonrheumatic aortic (valve) stenosis: Secondary | ICD-10-CM | POA: Diagnosis present

## 2019-07-06 LAB — MYOCARDIAL PERFUSION IMAGING
LV dias vol: 113 mL (ref 62–150)
LV sys vol: 43 mL
Peak HR: 73 {beats}/min
Rest HR: 55 {beats}/min
SDS: 3
SRS: 0
SSS: 3
TID: 0.96

## 2019-07-06 MED ORDER — PERFLUTREN LIPID MICROSPHERE
1.0000 mL | INTRAVENOUS | Status: AC | PRN
Start: 2019-07-06 — End: 2019-07-06
  Administered 2019-07-06: 2 mL via INTRAVENOUS

## 2019-07-06 MED ORDER — REGADENOSON 0.4 MG/5ML IV SOLN
0.4000 mg | Freq: Once | INTRAVENOUS | Status: AC
  Administered 2019-07-06: 0.4 mg via INTRAVENOUS

## 2019-07-06 MED ORDER — TECHNETIUM TC 99M TETROFOSMIN IV KIT
10.4000 | PACK | Freq: Once | INTRAVENOUS | Status: AC | PRN
  Administered 2019-07-06: 10.4 via INTRAVENOUS
  Filled 2019-07-06: qty 11

## 2019-07-06 MED ORDER — TECHNETIUM TC 99M TETROFOSMIN IV KIT
32.8000 | PACK | Freq: Once | INTRAVENOUS | Status: AC | PRN
  Administered 2019-07-06: 32.8 via INTRAVENOUS
  Filled 2019-07-06: qty 33

## 2019-07-09 ENCOUNTER — Other Ambulatory Visit: Payer: Self-pay | Admitting: Neurological Surgery

## 2019-07-09 ENCOUNTER — Telehealth: Payer: Self-pay | Admitting: Physician Assistant

## 2019-07-09 NOTE — Telephone Encounter (Signed)
Patient called on card master line. Michela Pitcher "someone called by daughter on Friday and left voice mail from this number". Waiting for pre-op clearance and recently had stress test and echo. I have briefly reviewed result with him. I have reassured him that someone from office call him about result and pre-op clearance.

## 2019-07-09 NOTE — Telephone Encounter (Signed)
   Primary Cardiologist: Kirk Ruths, MD  Chart reviewed as part of pre-operative protocol coverage. Patient was contacted 07/09/2019 in reference to pre-operative risk assessment for pending surgery as outlined below.  [name]. was last seen on 06/25/19 by Fabian Sharp PAC.  Given his lack of activity due to back pain and history of AS, I recommended nuclear stress test and echocardiogram. Nuclear stress test was negative for reversible ischemia. Echocardiogram did show some progression of his AS, but it is still in the moderate range. Reviewed chart with Dr. Stanford Breed and he is cleared for surgery.  Therefore, based on ACC/AHA guidelines, the patient would be at acceptable risk for the planned procedure without further cardiovascular testing.   I will route this recommendation to the requesting party via Epic fax function and remove from pre-op pool.  Please call with questions.  Tami Lin Duke, PA 07/09/2019, 10:54 AM

## 2019-07-10 ENCOUNTER — Telehealth: Payer: Self-pay

## 2019-07-10 NOTE — Telephone Encounter (Signed)
   [facility] Pre-operative Risk Assessment    Request for surgical clearance:  1. What type of surgery is being performed? Lumbar 2-3, Lumbar 3-4, Lumbar 4-5 Laminectomies, Posterior lumbar interbody fusion, pedicle screw fixation, mazor   2. When is this surgery scheduled? 07-23-2019   3. What type of clearance is required (medical clearance vs. Pharmacy clearance to hold med vs. Both)? MEDICAL  4. Are there any medications that need to be held prior to surgery and how long? NONE LISTED   5. Practice name and name of physician performing surgery?  [facility]   6. What is your office phone number [phone]    7.   What is your office fax number  [phone]  8.   Anesthesia type (None, local, MAC, general) ? General

## 2019-07-10 NOTE — Telephone Encounter (Signed)
Patient was cleared for surgery on 07/09/2019, by Doreene Adas PA-C.  I have refaxed his preoperative cardiac evaluation clearance note to [place] [facility].  I will remove him from the preop pool.  Jossie Ng. [facility] [place] [address] Office [phone] Fax [phone]

## 2019-07-18 NOTE — Progress Notes (Signed)
[place], [place] [address] Phone: [phone] Fax: [place], [place] [address] [place] [address] Phone: [phone] Fax: [phone]  [place], [place] [facility] [address] [place] [address] [place] [address] Phone: [phone] Fax: [phone]      Your procedure is scheduled on July 23, 2019  Report to [facility] Main Entrance "A" at 5:30 A.M., and check in at the Admitting office.  Call this number if you have problems the morning of surgery:  [phone]  Call [phone] if you have any questions prior to your surgery date Monday-Friday 8am-4pm    Remember:  Do not eat or drink after midnight the night before your surgery   Take these medicines the morning of surgery with A SIP OF WATER: NONE  As of today,  STOP taking any Aspirin (unless otherwise instructed by your surgeon), Aleve, Naproxen, Ibuprofen, Motrin, Advil, Goody's, BC's, all herbal medications, fish oil, and all vitamins.    The Morning of Surgery  Do not wear jewelry.  Do not wear lotions, powders,colognes, or deodorant    Men may shave face and neck.  Do not bring valuables to the hospital.  [facility] is not responsible for any belongings or valuables.  If you are a smoker, DO NOT Smoke 24 hours prior to surgery  If you wear a CPAP at night please bring your mask, tubing, and machine the morning of surgery   Remember that you must have someone to transport you home after your surgery, and remain with you for 24 hours if you are discharged the same day.   Please bring cases for contacts, glasses, hearing aids, dentures or bridgework because it cannot be worn into surgery.    Leave your suitcase in the car.  After surgery it may be brought to your room.  For patients  admitted to the hospital, discharge time will be determined by your treatment team.  Patients discharged the day of surgery will not be allowed to drive home.    Special instructions:   - Preparing For Surgery  Before surgery, you can play an important role. Because skin is not sterile, your skin needs to be as free of germs as possible. You can reduce the number of germs on your skin by washing with CHG (chlorahexidine gluconate) Soap before surgery.  CHG is an antiseptic cleaner which kills germs and bonds with the skin to continue killing germs even after washing.    Oral Hygiene is also important to reduce your risk of infection.  Remember - BRUSH YOUR TEETH THE MORNING OF SURGERY WITH YOUR REGULAR TOOTHPASTE  Please do not use if you have an allergy to CHG or antibacterial soaps. If your skin becomes reddened/irritated stop using the CHG.  Do not shave (including legs and underarms) for at least 48 hours prior to first CHG shower. It is OK to shave your face.  Please follow these instructions carefully.   1. Shower the NIGHT BEFORE SURGERY and the MORNING OF SURGERY with CHG Soap.   2. If you chose to wash your hair, wash your hair first as usual with your normal shampoo.  3. After you shampoo, rinse your hair and body thoroughly to remove the shampoo.  4. Use CHG as  you would any other liquid soap. You can apply CHG directly to the skin and wash gently with a scrungie or a clean washcloth.   5. Apply the CHG Soap to your body ONLY FROM THE NECK DOWN.  Do not use on open wounds or open sores. Avoid contact with your eyes, ears, mouth and genitals (private parts). Wash Face and genitals (private parts)  with your normal soap.   6. Wash thoroughly, paying special attention to the area where your surgery will be performed.  7. Thoroughly rinse your body with warm water from the neck down.  8. DO NOT shower/wash with your normal soap after using and rinsing off the CHG  Soap.  9. Pat yourself dry with a CLEAN TOWEL.  10. Wear CLEAN PAJAMAS to bed the night before surgery, wear comfortable clothes the morning of surgery  11. Place CLEAN SHEETS on your bed the night of your first shower and DO NOT SLEEP WITH PETS.    Day of Surgery:  Please shower the morning of surgery with the CHG soap Do not apply any deodorants/lotions. Please wear clean clothes to the hospital/surgery center.   Remember to brush your teeth WITH YOUR REGULAR TOOTHPASTE.   Please read over the following fact sheets that you were given.

## 2019-07-19 ENCOUNTER — Other Ambulatory Visit: Payer: Self-pay

## 2019-07-19 ENCOUNTER — Encounter (HOSPITAL_COMMUNITY)
Admission: RE | Admit: 2019-07-19 | Discharge: 2019-07-19 | Disposition: A | Discharge status: Home / Self Care | Payer: Medicare Other | Source: Ambulatory Visit | Attending: Neurological Surgery | Admitting: Neurological Surgery

## 2019-07-19 ENCOUNTER — Other Ambulatory Visit (HOSPITAL_COMMUNITY): Payer: Medicare Other

## 2019-07-19 ENCOUNTER — Encounter (HOSPITAL_COMMUNITY): Payer: Self-pay

## 2019-07-19 DIAGNOSIS — Z01812 Encounter for preprocedural laboratory examination: Secondary | ICD-10-CM | POA: Diagnosis present

## 2019-07-19 DIAGNOSIS — Z20828 Contact with and (suspected) exposure to other viral communicable diseases: Secondary | ICD-10-CM | POA: Diagnosis not present

## 2019-07-19 HISTORY — DX: Aneurysm of the ascending aorta, without rupture: I71.21

## 2019-07-19 HISTORY — DX: Unspecified osteoarthritis, unspecified site: M19.90

## 2019-07-19 HISTORY — DX: Thoracic aortic aneurysm, without rupture: I71.2

## 2019-07-19 HISTORY — DX: Cardiac arrhythmia, unspecified: I49.9

## 2019-07-19 HISTORY — DX: Cardiac murmur, unspecified: R01.1

## 2019-07-19 LAB — BASIC METABOLIC PANEL
Anion gap: 9 (ref 5–15)
BUN: 25 mg/dL — ABNORMAL HIGH (ref 8–23)
CO2: 25 mmol/L (ref 22–32)
Calcium: 9.4 mg/dL (ref 8.9–10.3)
Chloride: 107 mmol/L (ref 98–111)
Creatinine, Ser: 1.77 mg/dL — ABNORMAL HIGH (ref 0.61–1.24)
GFR calc Af Amer: 40 mL/min — ABNORMAL LOW (ref >60)
GFR calc non Af Amer: 35 mL/min — ABNORMAL LOW (ref >60)
Glucose, Bld: 115 mg/dL — ABNORMAL HIGH (ref 70–99)
Potassium: 4.1 mmol/L (ref 3.5–5.1)
Sodium: 141 mmol/L (ref 135–145)

## 2019-07-19 LAB — CBC
HCT: 44.1 % (ref 39.0–52.0)
Hemoglobin: 15.2 g/dL (ref 13.0–17.0)
MCH: 31.1 pg (ref 26.0–34.0)
MCHC: 34.5 g/dL (ref 30.0–36.0)
MCV: 90.2 fL (ref 80.0–100.0)
Platelets: 267 10*3/uL (ref 150–400)
RBC: 4.89 MIL/uL (ref 4.22–5.81)
RDW: 13 % (ref 11.5–15.5)
WBC: 8.9 10*3/uL (ref 4.0–10.5)
nRBC: 0 % (ref 0.0–0.2)

## 2019-07-19 LAB — TYPE AND SCREEN
ABO/RH(D): A POS
Antibody Screen: NEGATIVE

## 2019-07-19 LAB — SURGICAL PCR SCREEN
MRSA, PCR: NEGATIVE
Staphylococcus aureus: NEGATIVE

## 2019-07-19 NOTE — Progress Notes (Addendum)
PCP - A. Ford at [facility] Cardiologist - Stanford Breed  PPM/ICD - denies Device Orders -  Rep Notified -   CXR-n/a EKG - 06/26/2019 Stress Test - 07/06/2019 ECHO - 07/06/2019 Cardiac Cath -   Sleep Study - none CPAP -   Fasting Blood Sugar - n/a Checks Blood Sugar _____ times a day  Blood Thinner Instructions:n/a Aspirin Instructions:denies taking  ERAS Protcol -n/a PRE-SURGERY Ensure or G2-   COVID TEST- scheduled for today   Anesthesia review: yes, due to cardiac history  Patient denies shortness of breath, fever, cough and chest pain at PAT appointment   All instructions explained to the patient, with a verbal understanding of the material. Patient agrees to go over the instructions while at home for a better understanding. Patient also instructed to self quarantine after being tested for COVID-19. The opportunity to ask questions was provided.

## 2019-07-19 NOTE — Progress Notes (Signed)
Anesthesia Chart Review:  Case: [id number] Date/Time: 07/23/19 0715   Procedures:      Lumbar 2-3, Lumbar 3-4, Lumbar 4-5 Laminectomies, Posterior lumbar interbody fusion, pedicle screw fixation, mazor (N/A Back) - Lumbar 2-3, Lumbar 3-4, Lumbar 4-5 Laminectomies, Posterior lumbar interbody fusion, pedicle screw fixation, mazor     APPLICATION OF ROBOTIC ASSISTANCE FOR SPINAL PROCEDURE (N/A )   Anesthesia type: General   Pre-op diagnosis: Lumbar stenosis with neurogenic claudication   Location: [facility] OR ROOM 21 / [place] [facility]   Surgeons: Kristeen Miss, MD      DISCUSSION: Patient is an 85 year old male scheduled for the above procedure.   History includes former smoker (quit 1970), HTN, hypercholesterolemia, CAD (s/p CABG: LIMA-LAD, SVG-D1-CX, SVG-PDA-RPLA 07/13/06), bradycardia (post-CABG 2nd/3rd degree AV blocker, resolved off B-blocker 2007), PAF, murmur/aortic stenosis (moderate, approaching severe 07/06/19), ascending TAA (4.4 cm 08/21/18), carotid stenosis (1-39%, 08/21/18), TIA (09/2010), prostate cancer (s/p radioactive seed implant/brachytherapy implant 01/06/15), skin cancer (SCC scalp, s/p excision and complex closure 03/20/19). Labs trends suggest CKD. He also reported syncope surrounding his prostate biopsy, reported procaine used.   Preoperative cardiology input outlined by Fabian Sharp, [place] on 07/09/19: "[name]. was last seen on 06/25/19 by Fabian Sharp PAC.  Given his lack of activity due to back pain and history of AS, I recommended nuclear stress test and echocardiogram. Nuclear stress test was negative for reversible ischemia. Echocardiogram did show some progression of his AS, but it is still in the moderate range. Reviewed chart with Dr. Stanford Breed and he is cleared for surgery.  Therefore, based on ACC/AHA guidelines, the patient would be at acceptable risk for the planned procedure without further cardiovascular testing."  Cr 1.77, but most recently noted to be 1.6-1.70 per [facility] labs.  07/19/19 COVID-19 test in process.  If negative, and otherwise no acute changes then I anticipate that he could proceed as planned.   VS: BP 130/73   Pulse 70   Temp 36.6 C (Oral)   Resp 20   Ht 6\' 3"  (1.905 m)   Wt 109.9 kg   SpO2 99%   BMI 30.28 kg/m    PROVIDERS: Jeanella Anton, NP is PCP [facility]) Kirk Ruths, MD is cardiologist Andi Devon, MD is surgical oncologist [facility] Everywhere) Hollice Espy, MD is urologist   LABS: Preoperative labs noted. Cr 1.77. He has had an elevated Cr dating back to at least 06/2016. Comparison labs show Cr 1.5-1.7 in 07/2018 ([facility]) and 1.6-1.7 02/19/19-03/08/19 [facility]).  (all labs ordered are listed, but only abnormal results are displayed)  Labs Reviewed  BASIC METABOLIC PANEL - Abnormal; Notable for the following components:      Result Value   Glucose, Bld 115 (*)    BUN 25 (*)    Creatinine, Ser 1.77 (*)    GFR calc non Af Amer 35 (*)    GFR calc Af Amer 40 (*)    All other components within normal limits  SURGICAL PCR SCREEN  NOVEL CORONAVIRUS, NAA (HOSPITAL ORDER, SEND-OUT TO REF LAB)  CBC  TYPE AND SCREEN     IMAGES: CT L-spine 06/27/19: IMPRESSION: 1. No acute osseous findings. Chronic superior endplate compression deformities at L1 and L2. 2. Multilevel spondylosis as described, greatest at L3-4 where there is severe spinal stenosis and asymmetric left foraminal narrowing. Multifactorial spinal stenosis at L4-5 is also severe without significant foraminal narrowing. Overall findings appear similar to recent outside lumbar MRI. 3. Aortic Atherosclerosis (ICD10-I70.0).  CTA Chest 08/21/18: IMPRESSION: 1. Stable 4.4 cm mid ascending thoracic aortic aneurysm ([id number]) without complicating features. Recommend annual imaging followup by CTA or MRA. This recommendation follows 2010 ACCF/AHA/AATS/ACR/ASA/SCA/SCAI/SIR/STS/SVM Guidelines for the Diagnosis and Management of Patients with  Thoracic Aortic Disease. Circulation. 2010; 121: [id number] 2. Coarse aortic valve calcifications. Correlate with any echocardiographic evidence of stenosis or insufficiency.  MRI L-spine 05/01/19 [facility] CE): - Worsened type 1 Modic endplate changes at [id number] level. Slightly progressed compared to prior MRI in 2019/ - Unchanged small synovial cyst adjacent to left side of L4-L5 spinous process. - Decreased adventitial bursal fluid in the interspinous regions of L3-L4 and L4-L5 interspinous regions.  - Unchanged inflammatory arthropathy of the facet joints at L4-L5 and L5-S1 levels. - Multilevel degenerative changes of the lumbar spine with varying degrees of canal stenosis (severe L3-L4 and L4-5) and foraminal narrowing (severe left L3-L4 and right L4-L5), as detailed above.   EKG: 06/25/19 ([facility]): Sinus bradycardia with first-degree AV block Left axis deviation Incomplete right bundle branch block Possible anterior infarct, age undetermined   CV: Echo 07/06/19: IMPRESSIONS  1. Left ventricular ejection fraction, by visual estimation, is 60 to 65%. The left ventricle has normal function. There is severely increased left ventricular hypertrophy.  2. Asymmetric hypertrophy measuring up to 8mm in basal septum (50mm in posterior wall)  3. Left ventricular diastolic parameters are consistent with Grade II diastolic dysfunction (pseudonormalization).  4. Elevated left atrial pressure.  5. Global right ventricle has moderately reduced systolic function.The right ventricular size is normal. No increase in right ventricular wall thickness.  6. The aortic valve is abnormal. Aortic valve regurgitation is not visualized. Moderate aortic valve stenosis. Vmax 3.6 m/s, MG 29 mmHg, AVA 0.9 cm^2, DI 0.3.  7. The mitral valve is normal in structure. No evidence of mitral valve regurgitation.  8. The tricuspid valve is normal in structure. Tricuspid valve regurgitation is not demonstrated.  9. The  pulmonic valve was not well visualized. Pulmonic valve regurgitation is trivial. 10. TR signal is inadequate for assessing pulmonary artery systolic pressure. 11. Left atrial size was mildly dilated. 12. Right atrial size was normal. 13. There is mild dilatation of the ascending aorta measuring 39 mm. 14. Compared to prior echo 08/24/18, worsening AS, remains in moderate range but approaching severe.   Nuclear stress test 07/06/19:  EKG without ischemic changes  Normal perfusion and minimal soft tissue attenuation No ischemia  LVEF 62%  This is a low risk study.    Carotid US 08/21/18: Summary: - Right Carotid: Velocities in the right ICA are consistent with a 1-39% stenosis.                The ECA appears >50% stenosed.                RICA velocities within normal range and stable compared to the                prior exam. - Left Carotid: Velocities in the left ICA are consistent with a 1-39% stenosis.               LICA velocities within normal range and stable compared to the               prior exam. - Vertebrals:  Bilateral vertebral arteries demonstrate antegrade flow. - Subclavians: Normal flow hemodynamics were seen in bilateral subclavian              arteries.   48 hour Holter  monitor 09/10/10: Impression: Sinus rhythm with PVCs and PACs and PAT.  One brief run of atrial fibrillation.   Past Medical History:  Diagnosis Date  . Aortic stenosis   . Arthritis    OA- lumbar  . Atrial fibrillation ([place])   . BPH (benign prostatic hypertrophy)   . BRADYCARDIA   . CAD    s/p CABG in 2007 x 5  . Carotid stenosis    0-39% bilaterally  . Complication of anesthesia    ? procaine allergy, pt. remarks that he had passing out surrounding Prostate biopsy   . Dysrhythmia    told that he had some irreg. heart rate - told its afib  . Heart murmur   . History of kidney stones 2010   at [facility] treated with cystoscopy  . HYPERCHOLESTEROLEMIA    statin intolerant  .  HYPERTENSION   . Long term (current) use of anticoagulants   . NEPHROLITHIASIS   . PAF (paroxysmal atrial fibrillation) (HCC)    briefly noted on Holter monitor  . Prostate cancer ([place]) 2014   seed implant  . Skin cancer    sqaumous- top of head  . Stroke [facility])    mini stroke   . Thoracic ascending aortic aneurysm ([facility])    4.4 cm 08/21/18  . TIA Feb 2012   no residual  . TIA (transient ischemic attack)     Past Surgical History:  Procedure Laterality Date  . APPENDECTOMY  1999  . cardiac arrest following CABG N/A   . COLONOSCOPY    . CORONARY ARTERY BYPASS GRAFT  2007   x 5  . EYE SURGERY     cataracts removed - done at [facility], IOL in both eyes    . HERNIA REPAIR Left 1996   inguinal  . JOINT REPLACEMENT     L hip & L shoulder   . LITHOTRIPSY  2013  . RADIOACTIVE SEED IMPLANT N/A 01/06/2015   Procedure: RADIOACTIVE SEED IMPLANT/BRACHYTHERAPY IMPLANT;  Surgeon: Hollice Espy, MD;  Location: [facility] ORS;  Service: Urology;  Laterality: N/A;  . TOTAL HIP ARTHROPLASTY Left     MEDICATIONS: . oxyCODONE-acetaminophen (PERCOCET/ROXICET) 5-325 MG tablet  . Ascorbic Acid (VITAMIN C) 1000 MG tablet  . Cholecalciferol (VITAMIN D-3) 25 MCG (1000 UT) CAPS  . enalapril (VASOTEC) 20 MG tablet  . ezetimibe (ZETIA) 10 MG tablet  . hydrochlorothiazide (HYDRODIURIL) 25 MG tablet  . ibuprofen (ADVIL) 200 MG tablet  . tamsulosin (FLOMAX) 0.4 MG CAPS capsule   No current facility-administered medications for this encounter.    Myra Gianotti, PA-C Surgical Short Stay/Anesthesiology [facility] Phone [phone] [facility] Phone [phone] 07/20/2019 9:37 AM

## 2019-07-20 ENCOUNTER — Other Ambulatory Visit: Payer: Self-pay | Admitting: Neurological Surgery

## 2019-07-20 ENCOUNTER — Encounter (HOSPITAL_COMMUNITY): Payer: Self-pay

## 2019-07-20 LAB — NOVEL CORONAVIRUS, NAA (HOSP ORDER, SEND-OUT TO REF LAB; TAT 18-24 HRS): SARS-CoV-2, NAA: NOT DETECTED

## 2019-07-20 NOTE — Anesthesia Preprocedure Evaluation (Addendum)
Anesthesia Evaluation  Patient identified by MRN, date of birth, ID band Patient awake    Reviewed: Allergy & Precautions, NPO status , Patient's Chart, lab work & pertinent test results  Airway Mallampati: III  TM Distance: >3 FB Neck ROM: Full    Dental  (+) Edentulous Upper, Edentulous Lower   Pulmonary former smoker,    Pulmonary exam normal breath sounds clear to auscultation       Cardiovascular hypertension, Pt. on medications + CAD and + CABG (x 5 in 2007)  + dysrhythmias Atrial Fibrillation + Valvular Problems/Murmurs (moderate AS) AS  Rhythm:Irregular Rate:Normal + Systolic murmurs Echo [id number]:  1. Left ventricular ejection fraction, by visual estimation, is 60 to 65%. The left ventricle has normal function. There is severely increased left ventricular hypertrophy.  2. Asymmetric hypertrophy measuring up to 2mm in basal septum (32mm in posterior wall)  3. Left ventricular diastolic parameters are consistent with Grade II diastolic dysfunction (pseudonormalization).  4. Elevated left atrial pressure.  5. Global right ventricle has moderately reduced systolic function.The right ventricular size is normal. No increase in right ventricular wall thickness.  6. The aortic valve is abnormal. Aortic valve regurgitation is not visualized. Moderate aortic valve stenosis. Vmax 3.6 m/s, MG 29 mmHg, AVA 0.9 cm^2, DI 0.3.  7. The mitral valve is normal in structure. No evidence of mitral valve regurgitation.  8. The tricuspid valve is normal in structure. Tricuspid valve regurgitation is not demonstrated.  9. The pulmonic valve was not well visualized. Pulmonic valve regurgitation is trivial. 10. TR signal is inadequate for assessing pulmonary artery systolic pressure. 11. Left atrial size was mildly dilated. 12. Right atrial size was normal. 13. There is mild dilatation of the ascending aorta measuring 39 mm. 14. Compared to prior  echo 08/24/18, worsening AS, remains in moderate range but approaching severe.   Nuclear stress test 07/06/19: Normal perfusion and minimal soft tissue attenuation No ischemia. LVEF 62%. Low risk study.  Preoperative cardiology input outlined by Fabian Sharp, [place] on 07/09/19: "[name].was last seen on 11/23/20by Fabian Sharp PAC.Given his lack of activity due to back pain and history of AS, I recommended nuclear stress test and echocardiogram.Nuclear stress test was negative for reversible ischemia. Echocardiogram did show some progression of his AS, but it is still in the moderate range. Reviewed chart with Dr. Stanford Breed and he is cleared for surgery.  Thoracic ascending aortic aneurysm   Neuro/Psych TIACVA, No Residual Symptoms negative psych ROS   GI/Hepatic negative GI ROS, Neg liver ROS,   Endo/Other  negative endocrine ROS  Renal/GU negative Renal ROS     Musculoskeletal  (+) Arthritis ,   Abdominal (+) + obese,   Peds  Hematology negative hematology ROS (+)   Anesthesia Other Findings Lumbar stenosis with neurogenic claudication  Reproductive/Obstetrics                           Anesthesia Physical Anesthesia Plan  ASA: IV  Anesthesia Plan: General   Post-op Pain Management:    Induction: Intravenous  PONV Risk Score and Plan: 3 and Ondansetron, Dexamethasone, Midazolam and Treatment may vary due to age or medical condition  Airway Management Planned: Oral ETT  Additional Equipment: Arterial line  Intra-op Plan:   Post-operative Plan: Extubation in OR  Informed Consent: I have reviewed the patients History and Physical, chart, labs and discussed the procedure including the risks, benefits and alternatives for the proposed anesthesia with  the patient or authorized representative who has indicated his/her understanding and acceptance.       Plan Discussed with: CRNA  Anesthesia Plan Comments: (Reviewed PAT note  written 07/20/2019 by Myra Gianotti, PA-C. )      Anesthesia Quick Evaluation

## 2019-07-23 ENCOUNTER — Encounter (HOSPITAL_COMMUNITY): Payer: Self-pay | Admitting: Neurological Surgery

## 2019-07-23 ENCOUNTER — Inpatient Hospital Stay (HOSPITAL_COMMUNITY)
Admission: RE | Admit: 2019-07-23 | Discharge: 2019-07-25 | DRG: 455 | Disposition: A | Discharge status: Home / Self Care | Payer: Medicare Other | Attending: Neurological Surgery | Admitting: Neurological Surgery

## 2019-07-23 ENCOUNTER — Inpatient Hospital Stay (HOSPITAL_COMMUNITY): Payer: Medicare Other | Admitting: Anesthesiology

## 2019-07-23 ENCOUNTER — Other Ambulatory Visit: Payer: Self-pay

## 2019-07-23 ENCOUNTER — Inpatient Hospital Stay (HOSPITAL_COMMUNITY): Payer: Medicare Other | Admitting: Physician Assistant

## 2019-07-23 ENCOUNTER — Encounter (HOSPITAL_COMMUNITY)
Admission: RE | Disposition: A | Discharge status: Home / Self Care | Payer: Self-pay | Source: Home / Self Care | Attending: Neurological Surgery

## 2019-07-23 ENCOUNTER — Inpatient Hospital Stay (HOSPITAL_COMMUNITY): Payer: Medicare Other

## 2019-07-23 DIAGNOSIS — M4186 Other forms of scoliosis, lumbar region: Secondary | ICD-10-CM | POA: Diagnosis present

## 2019-07-23 DIAGNOSIS — I4891 Unspecified atrial fibrillation: Secondary | ICD-10-CM | POA: Diagnosis present

## 2019-07-23 DIAGNOSIS — I2510 Atherosclerotic heart disease of native coronary artery without angina pectoris: Secondary | ICD-10-CM | POA: Diagnosis present

## 2019-07-23 DIAGNOSIS — Z884 Allergy status to anesthetic agent status: Secondary | ICD-10-CM | POA: Diagnosis not present

## 2019-07-23 DIAGNOSIS — R531 Weakness: Secondary | ICD-10-CM | POA: Diagnosis present

## 2019-07-23 DIAGNOSIS — M48062 Spinal stenosis, lumbar region with neurogenic claudication: Secondary | ICD-10-CM | POA: Diagnosis present

## 2019-07-23 DIAGNOSIS — Z419 Encounter for procedure for purposes other than remedying health state, unspecified: Secondary | ICD-10-CM

## 2019-07-23 DIAGNOSIS — M4726 Other spondylosis with radiculopathy, lumbar region: Secondary | ICD-10-CM | POA: Diagnosis present

## 2019-07-23 DIAGNOSIS — Z888 Allergy status to other drugs, medicaments and biological substances status: Secondary | ICD-10-CM | POA: Diagnosis not present

## 2019-07-23 DIAGNOSIS — Z951 Presence of aortocoronary bypass graft: Secondary | ICD-10-CM | POA: Diagnosis not present

## 2019-07-23 DIAGNOSIS — I712 Thoracic aortic aneurysm, without rupture: Secondary | ICD-10-CM | POA: Diagnosis present

## 2019-07-23 HISTORY — PX: APPLICATION OF ROBOTIC ASSISTANCE FOR SPINAL PROCEDURE: SHX6753

## 2019-07-23 HISTORY — PX: ANTERIOR LAT LUMBAR FUSION: SHX1168

## 2019-07-23 HISTORY — PX: LUMBAR PERCUTANEOUS PEDICLE SCREW 3 LEVEL: SHX5562

## 2019-07-23 LAB — POCT I-STAT 7, (LYTES, BLD GAS, ICA,H+H)
Bicarbonate: 25.8 mmol/L (ref 20.0–28.0)
Calcium, Ion: 1.18 mmol/L (ref 1.15–1.40)
HCT: 37 % — ABNORMAL LOW (ref 39.0–52.0)
Hemoglobin: 12.6 g/dL — ABNORMAL LOW (ref 13.0–17.0)
O2 Saturation: 100 %
Potassium: 4.3 mmol/L (ref 3.5–5.1)
Sodium: 139 mmol/L (ref 135–145)
TCO2: 27 mmol/L (ref 22–32)
pCO2 arterial: 47.6 mmHg (ref 32.0–48.0)
pH, Arterial: 7.343 — ABNORMAL LOW (ref 7.350–7.450)
pO2, Arterial: 283 mmHg — ABNORMAL HIGH (ref 83.0–108.0)

## 2019-07-23 SURGERY — ANTERIOR LATERAL LUMBAR FUSION 3 LEVELS
Anesthesia: General | Site: Spine Lumbar

## 2019-07-23 MED ORDER — CEFAZOLIN SODIUM-DEXTROSE 2-4 GM/100ML-% IV SOLN
2.0000 g | Freq: Three times a day (TID) | INTRAVENOUS | Status: AC
  Administered 2019-07-23 – 2019-07-24 (×2): 2 g via INTRAVENOUS
  Filled 2019-07-23 (×2): qty 100

## 2019-07-23 MED ORDER — METHOCARBAMOL 1000 MG/10ML IJ SOLN
500.0000 mg | Freq: Four times a day (QID) | INTRAVENOUS | Status: DC | PRN
  Filled 2019-07-23: qty 5

## 2019-07-23 MED ORDER — DOCUSATE SODIUM 100 MG PO CAPS
100.0000 mg | ORAL_CAPSULE | Freq: Two times a day (BID) | ORAL | Status: DC
  Administered 2019-07-23 – 2019-07-25 (×4): 100 mg via ORAL
  Filled 2019-07-23 (×4): qty 1

## 2019-07-23 MED ORDER — 0.9 % SODIUM CHLORIDE (POUR BTL) OPTIME
TOPICAL | Status: DC | PRN
  Administered 2019-07-23: 1000 mL

## 2019-07-23 MED ORDER — ONDANSETRON HCL 4 MG PO TABS: 4.0000 mg | ORAL_TABLET | Freq: Four times a day (QID) | ORAL | Status: DC | PRN

## 2019-07-23 MED ORDER — BUPIVACAINE HCL (PF) 0.5 % IJ SOLN
INTRAMUSCULAR | Status: DC | PRN
  Administered 2019-07-23: 10 mL
  Administered 2019-07-23: 20 mL

## 2019-07-23 MED ORDER — BUPIVACAINE HCL (PF) 0.5 % IJ SOLN
INTRAMUSCULAR | Status: AC
  Filled 2019-07-23: qty 30

## 2019-07-23 MED ORDER — MENTHOL 3 MG MT LOZG: 1.0000 | LOZENGE | OROMUCOSAL | Status: DC | PRN

## 2019-07-23 MED ORDER — PHENYLEPHRINE HCL (PRESSORS) 10 MG/ML IV SOLN
INTRAVENOUS | Status: AC
  Filled 2019-07-23: qty 1

## 2019-07-23 MED ORDER — ALBUMIN HUMAN 5 % IV SOLN: INTRAVENOUS | Status: DC | PRN

## 2019-07-23 MED ORDER — LIDOCAINE-EPINEPHRINE 1 %-1:100000 IJ SOLN
INTRAMUSCULAR | Status: AC
  Filled 2019-07-23: qty 1

## 2019-07-23 MED ORDER — FENTANYL CITRATE (PF) 100 MCG/2ML IJ SOLN
25.0000 ug | INTRAMUSCULAR | Status: DC | PRN
  Administered 2019-07-23: 25 ug via INTRAVENOUS

## 2019-07-23 MED ORDER — SUCCINYLCHOLINE CHLORIDE 20 MG/ML IJ SOLN
INTRAMUSCULAR | Status: DC | PRN
  Administered 2019-07-23: 100 mg via INTRAVENOUS

## 2019-07-23 MED ORDER — ONDANSETRON HCL 4 MG/2ML IJ SOLN: 4.0000 mg | Freq: Once | INTRAMUSCULAR | Status: DC | PRN

## 2019-07-23 MED ORDER — DEXAMETHASONE SODIUM PHOSPHATE 10 MG/ML IJ SOLN
INTRAMUSCULAR | Status: DC | PRN
  Administered 2019-07-23: 10 mg via INTRAVENOUS

## 2019-07-23 MED ORDER — FENTANYL CITRATE (PF) 250 MCG/5ML IJ SOLN
INTRAMUSCULAR | Status: AC
  Filled 2019-07-23: qty 5

## 2019-07-23 MED ORDER — LACTATED RINGERS IV SOLN: INTRAVENOUS | Status: DC

## 2019-07-23 MED ORDER — EPHEDRINE 5 MG/ML INJ
INTRAVENOUS | Status: AC
  Filled 2019-07-23: qty 10

## 2019-07-23 MED ORDER — ROCURONIUM BROMIDE 10 MG/ML (PF) SYRINGE
PREFILLED_SYRINGE | INTRAVENOUS | Status: AC
  Filled 2019-07-23: qty 10

## 2019-07-23 MED ORDER — CHLORHEXIDINE GLUCONATE CLOTH 2 % EX PADS: 6.0000 | MEDICATED_PAD | Freq: Once | CUTANEOUS | Status: DC

## 2019-07-23 MED ORDER — LIDOCAINE 2% (20 MG/ML) 5 ML SYRINGE
INTRAMUSCULAR | Status: DC | PRN
  Administered 2019-07-23: 100 mg via INTRAVENOUS

## 2019-07-23 MED ORDER — CEFAZOLIN SODIUM-DEXTROSE 2-4 GM/100ML-% IV SOLN
2.0000 g | INTRAVENOUS | Status: AC
  Administered 2019-07-23 (×2): 2 g via INTRAVENOUS

## 2019-07-23 MED ORDER — ACETAMINOPHEN 500 MG PO TABS: 1000.0000 mg | ORAL_TABLET | Freq: Once | ORAL | Status: AC

## 2019-07-23 MED ORDER — SODIUM CHLORIDE 0.9% FLUSH: 3.0000 mL | INTRAVENOUS | Status: DC | PRN

## 2019-07-23 MED ORDER — POLYETHYLENE GLYCOL 3350 17 G PO PACK: 17.0000 g | PACK | Freq: Every day | ORAL | Status: DC | PRN

## 2019-07-23 MED ORDER — PHENOL 1.4 % MT LIQD: 1.0000 | OROMUCOSAL | Status: DC | PRN

## 2019-07-23 MED ORDER — BISACODYL 10 MG RE SUPP: 10.0000 mg | Freq: Every day | RECTAL | Status: DC | PRN

## 2019-07-23 MED ORDER — FUROSEMIDE 10 MG/ML IJ SOLN
INTRAMUSCULAR | Status: DC | PRN
  Administered 2019-07-23: 10 mg via INTRAMUSCULAR

## 2019-07-23 MED ORDER — IOHEXOL 300 MG/ML  SOLN
INTRAMUSCULAR | Status: DC | PRN
  Administered 2019-07-23: 5 mL

## 2019-07-23 MED ORDER — PROPOFOL 500 MG/50ML IV EMUL
INTRAVENOUS | Status: DC | PRN
  Administered 2019-07-23: 50 ug/kg/min via INTRAVENOUS

## 2019-07-23 MED ORDER — THROMBIN 5000 UNITS EX SOLR
CUTANEOUS | Status: AC
  Filled 2019-07-23: qty 5000

## 2019-07-23 MED ORDER — OXYCODONE-ACETAMINOPHEN 5-325 MG PO TABS
1.0000 | ORAL_TABLET | ORAL | Status: DC | PRN
  Administered 2019-07-23 – 2019-07-25 (×7): 1 via ORAL
  Filled 2019-07-23 (×7): qty 1

## 2019-07-23 MED ORDER — ONDANSETRON HCL 4 MG/2ML IJ SOLN: 4.0000 mg | Freq: Four times a day (QID) | INTRAMUSCULAR | Status: DC | PRN

## 2019-07-23 MED ORDER — PROPOFOL 10 MG/ML IV BOLUS
INTRAVENOUS | Status: AC
  Filled 2019-07-23: qty 20

## 2019-07-23 MED ORDER — ACETAMINOPHEN 650 MG RE SUPP: 650.0000 mg | RECTAL | Status: DC | PRN

## 2019-07-23 MED ORDER — SODIUM CHLORIDE 0.9% FLUSH
3.0000 mL | Freq: Two times a day (BID) | INTRAVENOUS | Status: DC
  Administered 2019-07-23 – 2019-07-25 (×2): 3 mL via INTRAVENOUS

## 2019-07-23 MED ORDER — THROMBIN 5000 UNITS EX SOLR
OROMUCOSAL | Status: DC | PRN
  Administered 2019-07-23: 5 mL
  Administered 2019-07-23: 5 mL via TOPICAL
  Administered 2019-07-23: 14:00:00 5 mL

## 2019-07-23 MED ORDER — THROMBIN 20000 UNITS EX SOLR
CUTANEOUS | Status: AC
  Filled 2019-07-23: qty 20000

## 2019-07-23 MED ORDER — EPHEDRINE SULFATE-NACL 50-0.9 MG/10ML-% IV SOSY
PREFILLED_SYRINGE | INTRAVENOUS | Status: DC | PRN
  Administered 2019-07-23: 10 mg via INTRAVENOUS

## 2019-07-23 MED ORDER — HYDROCHLOROTHIAZIDE 25 MG PO TABS
25.0000 mg | ORAL_TABLET | Freq: Every day | ORAL | Status: DC
  Administered 2019-07-23 – 2019-07-25 (×2): 25 mg via ORAL
  Filled 2019-07-23 (×2): qty 1

## 2019-07-23 MED ORDER — ONDANSETRON HCL 4 MG/2ML IJ SOLN
INTRAMUSCULAR | Status: AC
  Filled 2019-07-23: qty 2

## 2019-07-23 MED ORDER — METHOCARBAMOL 500 MG PO TABS
500.0000 mg | ORAL_TABLET | Freq: Four times a day (QID) | ORAL | Status: DC | PRN
  Administered 2019-07-23 – 2019-07-25 (×5): 500 mg via ORAL
  Filled 2019-07-23 (×5): qty 1

## 2019-07-23 MED ORDER — MORPHINE SULFATE (PF) 2 MG/ML IV SOLN: 2.0000 mg | INTRAVENOUS | Status: DC | PRN

## 2019-07-23 MED ORDER — ACETAMINOPHEN 500 MG PO TABS
ORAL_TABLET | ORAL | Status: AC
  Administered 2019-07-23: 1000 mg via ORAL
  Filled 2019-07-23: qty 2

## 2019-07-23 MED ORDER — SODIUM CHLORIDE 0.9 % IV SOLN: 250.0000 mL | INTRAVENOUS | Status: DC

## 2019-07-23 MED ORDER — FLEET ENEMA 7-19 GM/118ML RE ENEM: 1.0000 | ENEMA | Freq: Once | RECTAL | Status: DC | PRN

## 2019-07-23 MED ORDER — ALUM & MAG HYDROXIDE-SIMETH 200-200-20 MG/5ML PO SUSP: 30.0000 mL | Freq: Four times a day (QID) | ORAL | Status: DC | PRN

## 2019-07-23 MED ORDER — PHENYLEPHRINE 40 MCG/ML (10ML) SYRINGE FOR IV PUSH (FOR BLOOD PRESSURE SUPPORT)
PREFILLED_SYRINGE | INTRAVENOUS | Status: DC | PRN
  Administered 2019-07-23 (×7): 80 ug via INTRAVENOUS

## 2019-07-23 MED ORDER — FENTANYL CITRATE (PF) 100 MCG/2ML IJ SOLN
INTRAMUSCULAR | Status: AC
  Filled 2019-07-23: qty 2

## 2019-07-23 MED ORDER — MIDAZOLAM HCL 2 MG/2ML IJ SOLN
INTRAMUSCULAR | Status: AC
  Filled 2019-07-23: qty 2

## 2019-07-23 MED ORDER — ACETAMINOPHEN 325 MG PO TABS: 650.0000 mg | ORAL_TABLET | ORAL | Status: DC | PRN

## 2019-07-23 MED ORDER — FENTANYL CITRATE (PF) 250 MCG/5ML IJ SOLN
INTRAMUSCULAR | Status: DC | PRN
  Administered 2019-07-23: 50 ug via INTRAVENOUS
  Administered 2019-07-23: 100 ug via INTRAVENOUS
  Administered 2019-07-23 (×5): 50 ug via INTRAVENOUS

## 2019-07-23 MED ORDER — LACTATED RINGERS IV SOLN: INTRAVENOUS | Status: DC | PRN

## 2019-07-23 MED ORDER — VITAMIN D 25 MCG (1000 UNIT) PO TABS
1000.0000 [IU] | ORAL_TABLET | Freq: Every day | ORAL | Status: DC
  Administered 2019-07-23 – 2019-07-25 (×3): 1000 [IU] via ORAL
  Filled 2019-07-23 (×5): qty 1

## 2019-07-23 MED ORDER — PROPOFOL 10 MG/ML IV BOLUS
INTRAVENOUS | Status: DC | PRN
  Administered 2019-07-23: 150 mg via INTRAVENOUS

## 2019-07-23 MED ORDER — THROMBIN 5000 UNITS EX SOLR
CUTANEOUS | Status: AC
  Filled 2019-07-23: qty 10000

## 2019-07-23 MED ORDER — LIDOCAINE 2% (20 MG/ML) 5 ML SYRINGE
INTRAMUSCULAR | Status: AC
  Filled 2019-07-23: qty 5

## 2019-07-23 MED ORDER — CEFAZOLIN SODIUM 1 G IJ SOLR
INTRAMUSCULAR | Status: AC
  Filled 2019-07-23: qty 20

## 2019-07-23 MED ORDER — ONDANSETRON HCL 4 MG/2ML IJ SOLN
INTRAMUSCULAR | Status: DC | PRN
  Administered 2019-07-23: 4 mg via INTRAVENOUS

## 2019-07-23 MED ORDER — SENNA 8.6 MG PO TABS
1.0000 | ORAL_TABLET | Freq: Two times a day (BID) | ORAL | Status: DC
  Administered 2019-07-23 – 2019-07-25 (×4): 8.6 mg via ORAL
  Filled 2019-07-23 (×4): qty 1

## 2019-07-23 MED ORDER — PHENYLEPHRINE HCL-NACL 10-0.9 MG/250ML-% IV SOLN
INTRAVENOUS | Status: DC | PRN
  Administered 2019-07-23: 40 ug/min via INTRAVENOUS

## 2019-07-23 MED ORDER — ENALAPRIL MALEATE 20 MG PO TABS
20.0000 mg | ORAL_TABLET | Freq: Every day | ORAL | Status: DC
  Administered 2019-07-23 – 2019-07-25 (×2): 20 mg via ORAL
  Filled 2019-07-23 (×2): qty 1
  Filled 2019-07-23 (×2): qty 4
  Filled 2019-07-23: qty 1

## 2019-07-23 MED ORDER — CEFAZOLIN SODIUM-DEXTROSE 2-4 GM/100ML-% IV SOLN
INTRAVENOUS | Status: AC
  Filled 2019-07-23: qty 100

## 2019-07-23 MED ORDER — LIDOCAINE-EPINEPHRINE 1 %-1:100000 IJ SOLN
INTRAMUSCULAR | Status: DC | PRN
  Administered 2019-07-23: 20 mL
  Administered 2019-07-23: 10 mL
  Administered 2019-07-23: 7 mL

## 2019-07-23 MED ORDER — TAMSULOSIN HCL 0.4 MG PO CAPS
0.8000 mg | ORAL_CAPSULE | Freq: Every day | ORAL | Status: DC
  Administered 2019-07-23 – 2019-07-24 (×2): 0.8 mg via ORAL
  Filled 2019-07-23 (×2): qty 2

## 2019-07-23 SURGICAL SUPPLY — 99 items
ADH SKN CLS APL DERMABOND .7 (GAUZE/BANDAGES/DRESSINGS) ×4
BAG DECANTER FOR FLEXI CONT (MISCELLANEOUS) ×4 IMPLANT
BASKET BONE COLLECTION (BASKET) ×2 IMPLANT
BIT DRILL LONG 3.0X30 (BIT) ×1 IMPLANT
BIT DRILL LONG 3.0X30MM (BIT) ×1
BIT DRILL LONG 3X80 (BIT) IMPLANT
BIT DRILL LONG 3X80MM (BIT)
BIT DRILL LONG 4X80 (BIT) IMPLANT
BIT DRILL LONG 4X80MM (BIT)
BIT DRILL SHORT 3.0X30 (BIT) IMPLANT
BIT DRILL SHORT 3.0X30MM (BIT)
BIT DRILL SHORT 3X80 (BIT) IMPLANT
BIT DRILL SHORT 3X80MM (BIT)
BLADE CLIPPER SURG (BLADE) IMPLANT
BLADE SURG 11 STRL SS (BLADE) ×4 IMPLANT
BONE MATRIX OSTEOCEL PRO LRG (Bone Implant) ×4 IMPLANT
BONE MATRIX OSTEOCEL PRO MED (Bone Implant) ×2 IMPLANT
CARTRIDGE OIL MAESTRO DRILL (MISCELLANEOUS) ×2 IMPLANT
CLIP NEUROVISION LG (CLIP) ×2 IMPLANT
CONT SPEC 4OZ CLIKSEAL STRL BL (MISCELLANEOUS) ×6 IMPLANT
COROENT XL 12X22X55 (Orthopedic Implant) ×4 IMPLANT
COVER BACK TABLE 24X17X13 BIG (DRAPES) IMPLANT
COVER BACK TABLE 60X90IN (DRAPES) ×4 IMPLANT
DERMABOND ADVANCED (GAUZE/BANDAGES/DRESSINGS) ×4
DERMABOND ADVANCED .7 DNX12 (GAUZE/BANDAGES/DRESSINGS) ×6 IMPLANT
DEVICE DISSECT PLASMABLAD 3.0S (MISCELLANEOUS) IMPLANT
DIFFUSER DRILL AIR PNEUMATIC (MISCELLANEOUS) ×2 IMPLANT
DRAPE C-ARM 42X72 X-RAY (DRAPES) ×8 IMPLANT
DRAPE C-ARMOR (DRAPES) ×8 IMPLANT
DRAPE LAPAROTOMY 100X72X124 (DRAPES) ×8 IMPLANT
DRAPE SHEET LG 3/4 BI-LAMINATE (DRAPES) ×4 IMPLANT
DRSG OPSITE POSTOP 3X4 (GAUZE/BANDAGES/DRESSINGS) ×2 IMPLANT
DURAPREP 26ML APPLICATOR (WOUND CARE) ×8 IMPLANT
ELECT BLADE 4.0 EZ CLEAN MEGAD (MISCELLANEOUS) ×4
ELECT REM PT RETURN 9FT ADLT (ELECTROSURGICAL) ×8
ELECTRODE BLDE 4.0 EZ CLN MEGD (MISCELLANEOUS) IMPLANT
ELECTRODE REM PT RTRN 9FT ADLT (ELECTROSURGICAL) ×4 IMPLANT
EVACUATOR 3/16  PVC DRAIN (DRAIN) ×2
EVACUATOR 3/16 PVC DRAIN (DRAIN) IMPLANT
GAUZE 4X4 16PLY RFD (DISPOSABLE) ×2 IMPLANT
GAUZE SPONGE 4X4 12PLY STRL (GAUZE/BANDAGES/DRESSINGS) ×2 IMPLANT
GLOVE BIO SURGEON STRL SZ 6.5 (GLOVE) ×2 IMPLANT
GLOVE BIO SURGEON STRL SZ8 (GLOVE) ×2 IMPLANT
GLOVE BIO SURGEON STRL SZ8.5 (GLOVE) ×2 IMPLANT
GLOVE BIO SURGEONS STRL SZ 6.5 (GLOVE) ×2
GLOVE BIOGEL PI IND STRL 6.5 (GLOVE) IMPLANT
GLOVE BIOGEL PI IND STRL 8.5 (GLOVE) ×4 IMPLANT
GLOVE BIOGEL PI INDICATOR 6.5 (GLOVE) ×4
GLOVE BIOGEL PI INDICATOR 8.5 (GLOVE) ×4
GLOVE ECLIPSE 8.5 STRL (GLOVE) ×8 IMPLANT
GLOVE SURG SS PI 7.0 STRL IVOR (GLOVE) ×2 IMPLANT
GOWN STRL REUS W/ TWL LRG LVL3 (GOWN DISPOSABLE) IMPLANT
GOWN STRL REUS W/ TWL XL LVL3 (GOWN DISPOSABLE) ×6 IMPLANT
GOWN STRL REUS W/TWL 2XL LVL3 (GOWN DISPOSABLE) ×12 IMPLANT
GOWN STRL REUS W/TWL LRG LVL3 (GOWN DISPOSABLE) ×12
GOWN STRL REUS W/TWL XL LVL3 (GOWN DISPOSABLE) ×8
GUIDEWIRE NITINOL BEVEL TIP (WIRE) ×16 IMPLANT
HEMOSTAT POWDER KIT SURGIFOAM (HEMOSTASIS) ×6 IMPLANT
INTERLOCK LORDOTIC 12X22X50 (Bone Implant) IMPLANT
KIT BASIN OR (CUSTOM PROCEDURE TRAY) ×8 IMPLANT
KIT DILATOR XLIF 5 (KITS) ×1 IMPLANT
KIT SPINE MAZOR X ROBO DISP (MISCELLANEOUS) ×4 IMPLANT
KIT SURGICAL ACCESS MAXCESS 4 (KITS) ×2 IMPLANT
KIT TURNOVER KIT B (KITS) ×4 IMPLANT
KIT XLIF (KITS) ×1
LORDOTIC 12X22X50 (Bone Implant) ×4 IMPLANT
MARKER SKIN DUAL TIP RULER LAB (MISCELLANEOUS) ×4 IMPLANT
MILL MEDIUM DISP (BLADE) ×2 IMPLANT
MODULE NVM5 NEXT GEN EMG (NEEDLE) ×2 IMPLANT
NDL HYPO 25X1 1.5 SAFETY (NEEDLE) ×4 IMPLANT
NDL SPNL 22GX3.5 QUINCKE BK (NEEDLE) IMPLANT
NEEDLE HYPO 25X1 1.5 SAFETY (NEEDLE) ×8 IMPLANT
NEEDLE SPNL 22GX3.5 QUINCKE BK (NEEDLE) ×4 IMPLANT
NS IRRIG 1000ML POUR BTL (IV SOLUTION) ×8 IMPLANT
OIL CARTRIDGE MAESTRO DRILL (MISCELLANEOUS)
PACK LAMINECTOMY NEURO (CUSTOM PROCEDURE TRAY) ×8 IMPLANT
PAD ARMBOARD 7.5X6 YLW CONV (MISCELLANEOUS) ×6 IMPLANT
PIN HEAD 2.5X60MM (PIN) IMPLANT
PLASMABLADE 3.0S (MISCELLANEOUS) ×4
ROD RELINE MAS LORD 5.5X100MM (Rod) ×2 IMPLANT
ROD RELINE MAS LORD 5.5X110MM (Rod) ×2 IMPLANT
SCREW LOCK RELINE 5.5 TULIP (Screw) ×16 IMPLANT
SCREW MAS RELINE 6.5X45 POLY (Screw) ×4 IMPLANT
SCREW MAS RELINE 6.5X50 POLY (Screw) ×2 IMPLANT
SCREW PA RELINE MAS 5.5X50 C2 (Screw) ×10 IMPLANT
SCREW SCHANZ SA 4.0MM (MISCELLANEOUS) ×2 IMPLANT
SPONGE LAP 4X18 RFD (DISPOSABLE) IMPLANT
SPONGE SURGIFOAM ABS GEL SZ50 (HEMOSTASIS) IMPLANT
SUT VIC AB 1 CT1 18XBRD ANBCTR (SUTURE) ×2 IMPLANT
SUT VIC AB 1 CT1 8-18 (SUTURE) ×12
SUT VIC AB 2-0 CP2 18 (SUTURE) ×14 IMPLANT
SUT VIC AB 3-0 SH 8-18 (SUTURE) ×16 IMPLANT
SYR 30ML LL (SYRINGE) ×2 IMPLANT
TAPE CLOTH 4X10 WHT NS (GAUZE/BANDAGES/DRESSINGS) ×10 IMPLANT
TOWEL GREEN STERILE (TOWEL DISPOSABLE) ×8 IMPLANT
TOWEL GREEN STERILE FF (TOWEL DISPOSABLE) ×8 IMPLANT
TRAY FOLEY MTR SLVR 16FR STAT (SET/KITS/TRAYS/PACK) ×6 IMPLANT
TUBE MAZOR SA REDUCTION (TUBING) ×4 IMPLANT
WATER STERILE IRR 1000ML POUR (IV SOLUTION) ×6 IMPLANT

## 2019-07-23 NOTE — Anesthesia Procedure Notes (Signed)
Arterial Line Insertion Start/End12/21/2020 7:10 AM, 07/23/2019 7:15 AM Performed by: Amadeo Garnet, CRNA, CRNA  Patient location: Pre-op. Preanesthetic checklist: patient identified, IV checked, site marked, risks and benefits discussed, surgical consent, monitors and equipment checked, pre-op evaluation and timeout performed Lidocaine 1% used for infiltration and patient sedated Right, radial was placed Catheter size: 20 G Hand hygiene performed  and maximum sterile barriers used   Attempts: 1 Procedure performed without using ultrasound guided technique. Following insertion, dressing applied and Biopatch. Post procedure assessment: normal  Patient tolerated the procedure well with no immediate complications.

## 2019-07-23 NOTE — Anesthesia Procedure Notes (Addendum)
Procedure Name: Intubation Date/Time: 07/23/2019 7:55 AM Performed by: Amadeo Garnet, CRNA Pre-anesthesia Checklist: Patient identified, Emergency Drugs available, Suction available and Patient being monitored Patient Re-evaluated:Patient Re-evaluated prior to induction Oxygen Delivery Method: Circle system utilized Preoxygenation: Pre-oxygenation with 100% oxygen Induction Type: IV induction Ventilation: Oral airway inserted - appropriate to patient size and Mask ventilation without difficulty Laryngoscope Size: Mac and 4 Grade View: Grade I Tube type: Oral Tube size: 7.5 mm Number of attempts: 1 Airway Equipment and Method: Stylet Placement Confirmation: ETT inserted through vocal cords under direct vision and positive ETCO2 Secured at: 23 cm Tube secured with: Tape Dental Injury: Teeth and Oropharynx as per pre-operative assessment

## 2019-07-23 NOTE — Transfer of Care (Signed)
Immediate Anesthesia Transfer of Care Note  Patient: [name].  Procedure(s) Performed: Lumbar Two-Three, Lumbar Three-Four, Lumbar Four-Five XLIF, pedicle screw fixation, mazor (Left Spine Lumbar) LUMBAR PERCUTANEOUS PEDICLE SCREW 3 LEVEL (N/A ) APPLICATION OF ROBOTIC ASSISTANCE FOR SPINAL PROCEDURE (N/A )  Patient Location: PACU  Anesthesia Type:General  Level of Consciousness: sedated and patient cooperative  Airway & Oxygen Therapy: Patient Spontanous Breathing and Patient connected to face mask oxygen  Post-op Assessment: Report given to RN and Post -op Vital signs reviewed and stable  Post vital signs: Reviewed and stable  Last Vitals:  Vitals Value Taken Time  BP 107/59 07/23/19 1547  Temp    Pulse 68 07/23/19 1549  Resp 17 07/23/19 1549  SpO2 100 % 07/23/19 1549  Vitals shown include unvalidated device data.  Last Pain: There were no vitals filed for this visit.       Complications: No apparent anesthesia complications

## 2019-07-23 NOTE — Anesthesia Postprocedure Evaluation (Signed)
Anesthesia Post Note  Patient: [name].  Procedure(s) Performed: Lumbar Two-Three, Lumbar Three-Four, Lumbar Four-Five XLIF, pedicle screw fixation, mazor (Left Spine Lumbar) LUMBAR PERCUTANEOUS PEDICLE SCREW 3 LEVEL (N/A ) APPLICATION OF ROBOTIC ASSISTANCE FOR SPINAL PROCEDURE (N/A )     Patient location during evaluation: PACU Anesthesia Type: General Level of consciousness: awake Pain management: pain level controlled Vital Signs Assessment: post-procedure vital signs reviewed and stable Respiratory status: spontaneous breathing, nonlabored ventilation, respiratory function stable and patient connected to nasal cannula oxygen Cardiovascular status: blood pressure returned to baseline and stable Postop Assessment: no apparent nausea or vomiting Anesthetic complications: no    Last Vitals:  Vitals:   07/23/19 1652 07/23/19 1745  BP: 124/77 (!) 146/78  Pulse: 74 70  Resp: 11   Temp:  36.9 C  SpO2: 100% 98%    Last Pain:  Vitals:   07/23/19 1745  TempSrc: Oral  PainSc:                  Cliffton Spradley P Allis Quirarte

## 2019-07-23 NOTE — H&P (Signed)
CHIEF COMPLAINT: Weakness in the legs and back pain.  HISTORY OF PRESENT ILLNESS: [name] is an 85 year old left-handed individual who tells me that he has been developing problems with severe low back pain that has been going on for some time.  He notes that he can get relief by sitting and flexing forward and lying down, but typically, if he tries to stand and straighten up, he gets a sharp, almost electrical like sensation across his back that radiates into his legs and causes severe weakness in his lower extremities.  He has been seen and worked up elsewhere with an MRI of the lumbar spine done in September, and this study demonstrates that the patient has a degenerative scoliosis with severe stenosis at L2-3, L3-4, and L4-5. The worst areas of stenosis are at L3-4, L4-5 being the second worse, and L2-3 being moderate to severe.  The scoliosis appears to be degenerative in nature with an apex to the left.  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  Otherwise, he notes that he has had open bypass of 5 vessels in 2007 and hip replacement in 2012.  ALLERGIES: He notes some allergies to Flagyl and Novocain.  CURRENT MEDICATIONS: Anafranil and Tamsulosin.  REVIEW OF SYSTEMS: Notable for an irregular pulse and a heart murmur, in addition to abdominal pain and back pain that he has been experiencing, on a 14-point review sheet.  PHYSICAL EXAM: I note that he stands straight and erect, but he does tend to favor a slight forward stoop.  His motor function appears good in the iliopsoas, the quadriceps, tibialis anterior, and gastrocs, but he has absent reflexes in the patellae and the Achilles  both.  IMPRESSION: The patient notes that he has had significant pain that has been unrelenting now for a long period of time.  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  I noted to him that ultimately surgery would be required to decompress the very high degree of stenosis that he has at the 3 worse levels, L2-3, L3-4, and L4-5.  The surgery involves a complete laminectomy, removal of the overgrown bone that is causing the stenosis, followed by discectomy at L2-3, L3-4, and L4-5 and then fusing those vertebrae together with pedicle screws being placed.  This is a substantial operation and will require some time to heal.  I noted that after the surgery, he will require an external corset for a period of about 2 months, and for the first couple of weeks after surgery, most individuals require some assistance.  He does live independently and noted he may want to make some arrangements for that ahead of time.  Ultimately though, I believe that getting this decompressed and stabilized should allow him to function better.  We can plan on scheduling the surgery at the earliest convenience.  We obtained a CT scan of the lumbar spine with a robotic protocol to aid in screw placement prior to the surgery, and after review of this study it was decided that an  anterolateral approach to decompression indirectly would be a better choice for [name].  He will undergo a laminectomy of L4-5 but his fusions will be done via the anterolateral approach with a more minimally invasive technique.  This was discussed with him and he approves.

## 2019-07-23 NOTE — Evaluation (Signed)
Physical Therapy Evaluation Patient Details Name: [name]. MRN: [id number] DOB: February 06, 1934 Today's Date: 07/23/2019   History of Present Illness  pt is an 85 y/o male admitted for bil radicular leg and back pain, s/p xlif L2-5 with fixation.  Clinical Impression  Pt admitted with/for lumbar surgery.  Pt presently at min A level.  Pt currently limited functionally due to the problems listed below.  (see problems list.)  Pt will benefit from PT to maximize function and safety to be able to get home safely with available assist .      Follow Up Recommendations No PT follow up;Supervision/Assistance - 24 hour;Other (comment)(likely no need for f/u PT)    Equipment Recommendations  None recommended by PT;Other (comment)(3 in 1 maybe)    Recommendations for Other Services       Precautions / Restrictions Precautions Precautions: Back Precaution Booklet Issued: No Precaution Comments: back Required Braces or Orthoses: Spinal Brace Spinal Brace: Lumbar corset;Applied in sitting position      Mobility  Bed Mobility Overal bed mobility: Needs Assistance Bed Mobility: Rolling;Sidelying to Sit;Sit to Sidelying Rolling: Min assist Sidelying to sit: Min assist     Sit to sidelying: Min assist General bed mobility comments: cued for technique and assisted at trunk and LE's as needed  Transfers Overall transfer level: Needs assistance   Transfers: Sit to/from Stand Sit to Stand: Min assist         General transfer comment: cues for safety  Ambulation/Gait Ambulation/Gait assistance: Min assist Gait Distance (Feet): 10 Feet Assistive device: Rolling walker (2 wheeled) Gait Pattern/deviations: Step-through pattern Gait velocity: slow   General Gait Details: short tentative steps, moderate use of the RW  Stairs            Wheelchair Mobility    Modified Rankin (Stroke Patients Only)       Balance Overall balance assessment: Mild deficits  observed, not formally tested                                           Pertinent Vitals/Pain Pain Assessment: Faces Faces Pain Scale: Hurts even more Pain Location: back Pain Descriptors / Indicators: Discomfort;Grimacing;Guarding Pain Intervention(s): Monitored during session;Premedicated before session    Home Living Family/patient expects to be discharged to:: Private residence Living Arrangements: Alone Available Help at Discharge: Family;Available 24 hours/day(dtr is off for 2 weeks) Type of Home: House Home Access: Stairs to enter Entrance Stairs-Rails: Right;Left Entrance Stairs-Number of Steps: 5 Home Layout: One level;Able to live on main level with bedroom/bathroom Home Equipment: Gilford Rile - 2 wheels;Cane - single point      Prior Function Level of Independence: Independent         Comments: driving active     Hand Dominance        Extremity/Trunk Assessment   Upper Extremity Assessment Upper Extremity Assessment: Overall WFL for tasks assessed    Lower Extremity Assessment Lower Extremity Assessment: Overall WFL for tasks assessed    Cervical / Trunk Assessment Cervical / Trunk Assessment: Normal  Communication   Communication: No difficulties  Cognition Arousal/Alertness: Awake/alert;Lethargic Behavior During Therapy: WFL for tasks assessed/performed Overall Cognitive Status: Within Functional Limits for tasks assessed  General Comments General comments (skin integrity, edema, etc.): pt/dtr instructed in back care/prec, log roll, lifting restrictions, bracing issues and progression of activity    Exercises     Assessment/Plan    PT Assessment Patient needs continued PT services  PT Problem List Decreased strength;Decreased activity tolerance;Decreased balance;Decreased mobility;Decreased knowledge of use of DME;Decreased knowledge of precautions;Pain       PT  Treatment Interventions DME instruction;Gait training;Stair training;Functional mobility training;Therapeutic activities;Balance training;Patient/family education    PT Goals (Current goals can be found in the Care Plan section)  Acute Rehab PT Goals Patient Stated Goal: home quickly PT Goal Formulation: With patient Time For Goal Achievement: 07/30/19 Potential to Achieve Goals: Good    Frequency Min 5X/week   Barriers to discharge        Co-evaluation               AM-PAC PT "6 Clicks" Mobility  Outcome Measure Help needed turning from your back to your side while in a flat bed without using bedrails?: A Little Help needed moving from lying on your back to sitting on the side of a flat bed without using bedrails?: A Little Help needed moving to and from a bed to a chair (including a wheelchair)?: A Little Help needed standing up from a chair using your arms (e.g., wheelchair or bedside chair)?: A Little Help needed to walk in hospital room?: A Little Help needed climbing 3-5 steps with a railing? : A Little 6 Click Score: 18    End of Session   Activity Tolerance: Patient tolerated treatment well;Patient limited by pain Patient left: in bed;with call bell/phone within reach;with family/visitor present Nurse Communication: Mobility status PT Visit Diagnosis: Unsteadiness on feet (R26.81);Other abnormalities of gait and mobility (R26.89);Pain Pain - part of body: (back)    Time: [id number] PT Time Calculation (min) (ACUTE ONLY): 36 min   Charges:   PT Evaluation $PT Eval Moderate Complexity: 1 Mod PT Treatments $Gait Training: 8-22 mins        07/23/2019  Ginger Carne., PT Acute Rehabilitation Services [phone]  (pager) [phone]  (office)  Tessie Fass Matthews Franks 07/23/2019, 6:48 PM

## 2019-07-24 ENCOUNTER — Encounter: Payer: Self-pay | Admitting: *Deleted

## 2019-07-24 LAB — CBC
HCT: 31.8 % — ABNORMAL LOW (ref 39.0–52.0)
Hemoglobin: 10.8 g/dL — ABNORMAL LOW (ref 13.0–17.0)
MCH: 30.8 pg (ref 26.0–34.0)
MCHC: 34 g/dL (ref 30.0–36.0)
MCV: 90.6 fL (ref 80.0–100.0)
Platelets: 191 10*3/uL (ref 150–400)
RBC: 3.51 MIL/uL — ABNORMAL LOW (ref 4.22–5.81)
RDW: 13 % (ref 11.5–15.5)
WBC: 11 10*3/uL — ABNORMAL HIGH (ref 4.0–10.5)
nRBC: 0 % (ref 0.0–0.2)

## 2019-07-24 LAB — BASIC METABOLIC PANEL
Anion gap: 10 (ref 5–15)
BUN: 30 mg/dL — ABNORMAL HIGH (ref 8–23)
CO2: 24 mmol/L (ref 22–32)
Calcium: 8.4 mg/dL — ABNORMAL LOW (ref 8.9–10.3)
Chloride: 103 mmol/L (ref 98–111)
Creatinine, Ser: 1.7 mg/dL — ABNORMAL HIGH (ref 0.61–1.24)
GFR calc Af Amer: 42 mL/min — ABNORMAL LOW (ref >60)
GFR calc non Af Amer: 36 mL/min — ABNORMAL LOW (ref >60)
Glucose, Bld: 137 mg/dL — ABNORMAL HIGH (ref 70–99)
Potassium: 3.8 mmol/L (ref 3.5–5.1)
Sodium: 137 mmol/L (ref 135–145)

## 2019-07-24 LAB — POCT I-STAT 7, (LYTES, BLD GAS, ICA,H+H)
Acid-base deficit: 4 mmol/L — ABNORMAL HIGH (ref 0.0–2.0)
Bicarbonate: 22.9 mmol/L (ref 20.0–28.0)
Calcium, Ion: 1.12 mmol/L — ABNORMAL LOW (ref 1.15–1.40)
HCT: 35 % — ABNORMAL LOW (ref 39.0–52.0)
Hemoglobin: 11.9 g/dL — ABNORMAL LOW (ref 13.0–17.0)
O2 Saturation: 100 %
Potassium: 3.9 mmol/L (ref 3.5–5.1)
Sodium: 139 mmol/L (ref 135–145)
TCO2: 24 mmol/L (ref 22–32)
pCO2 arterial: 46.2 mmHg (ref 32.0–48.0)
pH, Arterial: 7.304 — ABNORMAL LOW (ref 7.350–7.450)
pO2, Arterial: 270 mmHg — ABNORMAL HIGH (ref 83.0–108.0)

## 2019-07-24 NOTE — Progress Notes (Signed)
Physical Therapy Treatment Patient Details Name: [name]. MRN: [id number] DOB: January 24, 1934 Today's Date: 07/24/2019    History of Present Illness pt is an 85 y/o male admitted for bil radicular leg and back pain, s/p xlif L2-5 with fixation.    PT Comments    Pt was initially agitated by lying in the bed, but mellowed as he moved about and able to listen to the education.  Emphasis on bed mobility, transfer safety, education and progressing gait.  Pt declined pushing gait further to the stairway.  "The stairs will be easy", but we discussed technique anyway.    Follow Up Recommendations  No PT follow up;Supervision/Assistance - 24 hour;Other (comment)     Equipment Recommendations  None recommended by PT;Other (comment)    Recommendations for Other Services       Precautions / Restrictions Precautions Precautions: Back Precaution Booklet Issued: No Precaution Comments: back Required Braces or Orthoses: Spinal Brace Spinal Brace: Lumbar corset;Applied in sitting position    Mobility  Bed Mobility Overal bed mobility: Needs Assistance Bed Mobility: Rolling;Sidelying to Sit;Sit to Sidelying Rolling: Min guard Sidelying to sit: Min guard     Sit to sidelying: Min guard General bed mobility comments: cued on methods to build momentum, pt rolled without assist.  He states he will sleep in his recliner.  Transfers Overall transfer level: Needs assistance   Transfers: Sit to/from Stand Sit to Stand: Min guard         General transfer comment: cues for safety, hand placement  Ambulation/Gait Ambulation/Gait assistance: Min guard Gait Distance (Feet): 130 Feet Assistive device: Rolling walker (2 wheeled) Gait Pattern/deviations: Step-through pattern Gait velocity: slower   General Gait Details: more upright, still tentative and slower cadence.  He pushed the IV pole for 10-15 feet then decided to use RW afterward.   Stairs Stairs: (pt deferred going  further to the stairs.)           Wheelchair Mobility    Modified Rankin (Stroke Patients Only)       Balance Overall balance assessment: Mild deficits observed, not formally tested                                          Cognition Arousal/Alertness: Awake/alert;Lethargic Behavior During Therapy: WFL for tasks assessed/performed Overall Cognitive Status: Within Functional Limits for tasks assessed                                        Exercises      General Comments General comments (skin integrity, edema, etc.): pt reinforced in all back care, transitions to/from sidelying, donning/doffing brace, lifting restrictions, progression of activity.      Pertinent Vitals/Pain Pain Assessment: Faces Faces Pain Scale: Hurts little more Pain Location: back Pain Descriptors / Indicators: Discomfort;Grimacing;Guarding Pain Intervention(s): Monitored during session    Home Living Family/patient expects to be discharged to:: Private residence Living Arrangements: Alone                  Prior Function            PT Goals (current goals can now be found in the care plan section) Acute Rehab PT Goals Patient Stated Goal: home quickly PT Goal Formulation: With patient Time For Goal Achievement: 07/30/19 Potential to  Achieve Goals: Good Progress towards PT goals: Progressing toward goals    Frequency    Min 5X/week      PT Plan Current plan remains appropriate    Co-evaluation              AM-PAC PT "6 Clicks" Mobility   Outcome Measure  Help needed turning from your back to your side while in a flat bed without using bedrails?: A Little Help needed moving from lying on your back to sitting on the side of a flat bed without using bedrails?: A Little Help needed moving to and from a bed to a chair (including a wheelchair)?: A Little Help needed standing up from a chair using your arms (e.g., wheelchair or bedside  chair)?: A Little Help needed to walk in hospital room?: A Little Help needed climbing 3-5 steps with a railing? : A Little 6 Click Score: 18    End of Session   Activity Tolerance: Patient tolerated treatment well;Patient limited by pain Patient left: with call bell/phone within reach;in chair Nurse Communication: Mobility status PT Visit Diagnosis: Unsteadiness on feet (R26.81);Other abnormalities of gait and mobility (R26.89);Pain Pain - part of body: (back)     Time: [id number] PT Time Calculation (min) (ACUTE ONLY): 24 min  Charges:  $Gait Training: 8-22 mins $Therapeutic Activity: 8-22 mins                     07/24/2019  Sergio Carne., PT Acute Rehabilitation Services [phone]  (pager) [phone]  (office)   [name] 07/24/2019, 12:13 PM

## 2019-07-24 NOTE — Progress Notes (Signed)
Patient ID: [name]., male   DOB: November 20, 1933, 85 y.o.   MRN: [id number] Vital signs stable  Appears to be doing well Pain under better control this afternoon. Mobilizing well Possible discharge tomorrow Drain out.

## 2019-07-24 NOTE — Op Note (Signed)
Date of surgery: 07/23/2019 Preoperative diagnosis: Lumbar spondylosis and stenosis L2-3 L3-4 L4-5, neurogenic claudication, lumbar radiculopathy.  Degenerative scoliosis. Postoperative diagnosis: Same Procedure: Anterolateral decompression L2-3 L3-4 L4-5 with XLIF technique, placement of interbody spacer with allograft arthrodesis using osteocell.  Percutaneous pedicle screw fixation L2-L5 with robotic assistance.  Intraoperative myelogram.  Lumbar laminectomy L3-L5 with decompression of the thecal sac and nerve roots.  Posterior lateral arthrodesis with autograft L3-L5.  Intraoperative neural monitoring. Surgeon: [name] First assistant: [name] Indications: [name] is an 85 year old individual whose had the severe back and leg pain for the last several months and has lost the ability to walk even short distances.  Transferring from bed to chair has become very difficult.  He was found to have a severe spondylitic stenosis on MRI at L 2-3, L3-4, L4-5 with lower 2 levels being the most severe.  Because of his advanced age a minimally invasive approach was chosen to decompress and stabilize this process with a laminectomy to decompress the central canal at the lower levels.  EMG monitoring will be performed during the time of this case.  Procedure: Patient was brought to the operating room supine on the stretcher.  After the smooth induction of general endotracheal anesthesia he was placed into the right lateral decubitus position and the bony prominences were appropriately padded and protected.  Orthogonal imaging was obtained to verify the position on the table and when this was verified the patient's body was secured to the operating table with tape.  Then by marking the lateral aperture to secure entry areas into the L4-5 L3-4 and L5-3 disc spaces the skin was marked appropriately and cleansed with alcohol DuraPrep and draped in a sterile fashion.  The surgery was started at L4-5  were through the transverse incision a blunt probe was passed with a second incision created posteriorly in the flank to allow passage into the retroperitoneal space.  Then by guiding the blunt probe through the retroperitoneal space the psoas muscle was entered and the probe was centered at the L4-5 space in the mid level of the disc.  K wire was passed into this region and EMG monitoring was performed to make sure that no elements of the lumbar plexus were being affected.  A series of dilators were passed all the time checking EMG monitoring.  Then 160 mm deep retractor was placed over these dilators to allow exposure of the interspace at L4-L5.  Again EMG monitoring was per provided posteriorly to make sure no elements of the lumbar plexus were placed and a shim was placed into the disc space at L4-L5.  A discectomy was then performed at L4-L5 using a #15 blade to open the disc space and a series of curettes and rongeurs to evacuate a substantial quantity of severely degenerated desiccated disc material endplates were curettaged smoothed and once this was secured it was felt that a 12 mm tall 22 mm wide by 50 mm broad spacer with 10 degrees of lordosis would fit best into this interval.  This was a peek spacer that was filled with osteocell allograft.  He was placed into the interval and radiographic confirmation of its position was obtained in AP and lateral projections.  Once this was verified the retractor was removed from this position and then a second incision was created to approach both L2-3 and L3-4.  These areas were decompressed in a similar fashion.  The assistant helped with monitoring the EMG and providing retraction of some lateral  guidance while the interbody spacer was being placed.  The same process was repeated at L2-3 and here a 12 mm x 22 mm x 55 mm spacer was placed at L3-4 and a 12 x 22 x 50 mm spacer was placed at L2-3.  Once this was completed the incisions were closed with 2-0 Vicryl in  the fascia and 3-0 Vicryl subcuticularly and Dermabond was placed on the skin.  The patient was then carefully turned from the lateral decubitus position to the prone position on a [place] table.  The back was prepped with alcohol DuraPrep and draped in a sterile fashion and the robotic arm was attached to the [place] table and a pin was placed in the right posterior superior iliac crest.  The robotic arm was attached to this pin and registration x-rays were obtained in the AP and oblique positions to register the robotic arm to the preplanning CT scan.  Once this was verified pedicle entry sites were chosen at L2-L3-L4 and L5 and K wires were passed into the pedicles using the robotic arm for guidance.  Then while working with the assistant to perform one side while I worked on the right side pedicle screws were placed on the right side with 5.5 x 50 mm screws being placed at L2-L3 and L4 and 6.5 x 45 mm screw being placed in L5 on the left side Dr. Arnoldo Morale placed 5.5 x 50 mm screws in L2 and L3 and 6.5 x 50 mm screw in L4 and 6.5 x 45 mm screw and L5.  Once all the screws were placed rod length was measured and on the right side a 5.5 x 110 mm rod was passed between the screw heads in the neutral construct and secured on the left side a 5.5 x 100 mm rod was placed between the screw heads and secured also final radiographs were obtained in the AP and lateral projection.  With this being performed then it was decided to perform an intraoperative myelogram with 5 cc of Isovue 300 contrast injected into the space at L2-L3.  There was noted to be a high-grade block at the level of L3-4 and L4-5 on the myelogram thus necessitating the laminectomy that had been preplanned.  A midline incision was created in the lumbar spine and the fascia was dissected in a subperiosteal fashion to expose L5 for at the L3-4 junction and a L4-5 junction.  Laminectomy was then performed at L4 harvesting the spinous process and laminar  bone.  The central canal was carefully decompressed with thickened redundant yellow ligament in this region and overgrown hypertrophic facet bone also being removed a common dural tube was explored carefully and the path of the L4 nerve roots inferiorly and the L3 nerve root superiorly were decompressed.  L5 was undercut substantially to allow passage of the common dural tube and the L5 nerve root into the foramen.  Once this was secured and of contrast had remained that on follow-up imaging 1 could see that the central canal and lateral recesses were well decompressed.  With this being verified hemostasis was achieved and the lateral gutters were then decorticated from L3-L5 with the autograft bone being packed into the lateral gutters on either side.  A large Hemovac drain was then placed into the epidural space over this region and brought out through separate stab incision and the lumbodorsal fascia was closed with #1 Vicryl interrupted fashion 2-0 Vicryl was used in subcutaneous tissues and 3-0 Vicryl subcuticularly this was  done for all the incisions including the posterior lateral incisions for the percutaneous fixation.  At the end of the case the patient's blood loss was estimated at 400 cc for the entire procedure and he was then returned to recovery room in stable condition.  No EMG abnormalities were noted or recorded during the entirety of this procedure.

## 2019-07-24 NOTE — Evaluation (Addendum)
Occupational Therapy Evaluation Patient Details Name: [name]. MRN: [id number] DOB: 03-03-1934 Today's Date: 07/24/2019    History of Present Illness pt is an 85 y/o male admitted for bil radicular leg and back pain, s/p xlif L2-5 with fixation.Pmh : h Aortic stenosis, Arthritis, Atrial fibrillation , BPH (benign prostatic hypertrophy), BRADYCARDIA, CAD, Carotid stenosis, Complication of anesthesia, Dysrhythmia, Heart murmur, History of kidney stones (2010), HYPERCHOLESTEROLEMIA, HYPERTENSION, Long term (current) use of anticoagulants, NEPHROLITHIASIS, PAF (paroxysmal atrial fibrillation) (), Prostate cancer ( (2014), Skin cancer, Stroke [facility]), Thoracic ascending aortic aneurysm (), TIA (Feb 2012), and TIA (transient ischemic attack).    Clinical Impression   Patient is s/p XLIF L2-5 surgery resulting in functional limitations due to the deficits listed below (see OT problem list). Pt complete basic transfer min (A) with RW. Pt currently with pain and educated on notifying RN staff for medication. Pt requesting to return to supine.  Patient will benefit from skilled OT acutely to increase independence and safety with ADLS to allow discharge home.     Follow Up Recommendations  No OT follow up    Equipment Recommendations  Tub/shower seat(RW)    Recommendations for Other Services       Precautions / Restrictions Precautions Precautions: Back Precaution Booklet Issued: No Precaution Comments: back Required Braces or Orthoses: Spinal Brace Spinal Brace: Lumbar corset;Applied in sitting position Restrictions Weight Bearing Restrictions: No      Mobility Bed Mobility Overal bed mobility: Needs Assistance Bed Mobility: Sit to Supine Rolling: Min assist Sidelying to sit: Min guard   Sit to supine: Mod assist Sit to sidelying: Min guard General bed mobility comments: mod cues for positioning and (A) to elevate bil LE on bed surface  Transfers Overall transfer  level: Needs assistance   Transfers: Sit to/from Stand Sit to Stand: Min assist         General transfer comment: pt with proper hand placement and encouraged to scoot inside of lifting with arms to move foward in chair    Balance Overall balance assessment: Mild deficits observed, not formally tested                                         ADL either performed or assessed with clinical judgement   ADL Overall ADL's : Needs assistance/impaired Eating/Feeding: Independent   Grooming: Wash/dry hands;Independent   Upper Body Bathing: Minimal assistance   Lower Body Bathing: Maximal assistance   Upper Body Dressing : Moderate assistance   Lower Body Dressing: Maximal assistance   Toilet Transfer: Minimal assistance;RW           Functional mobility during ADLs: Minimal assistance;Rolling walker General ADL Comments: pt with uncontrolled descend  Demonstration and education on shower transfer with shower seat during session. Daughter educated on how to place seat.    Vision Baseline Vision/History: Wears glasses Wears Glasses: Reading only       Perception     Praxis      Pertinent Vitals/Pain Pain Assessment: Faces Faces Pain Scale: Hurts even more Pain Location: back Pain Descriptors / Indicators: Discomfort;Grimacing;Guarding;Operative site guarding Pain Intervention(s): Monitored during session;Repositioned;Patient requesting pain meds-RN notified     Hand Dominance Left   Extremity/Trunk Assessment Upper Extremity Assessment Upper Extremity Assessment: Overall WFL for tasks assessed   Lower Extremity Assessment Lower Extremity Assessment: Defer to PT evaluation   Cervical / Trunk Assessment Cervical /  Trunk Assessment: Normal   Communication Communication Communication: No difficulties   Cognition Arousal/Alertness: Awake/alert;Lethargic Behavior During Therapy: WFL for tasks assessed/performed Overall Cognitive Status: Within  Functional Limits for tasks assessed                                     General Comments  pt with good recal of precautions. pt at baseline unable to reach feet without bending    Exercises     Shoulder Instructions      Home Living Family/patient expects to be discharged to:: Private residence Living Arrangements: Alone Available Help at Discharge: Family;Available 24 hours/day(dtr is off for 2 weeks) Type of Home: House Home Access: Stairs to enter [employer] of Steps: 5 Entrance Stairs-Rails: Right;Left Home Layout: One level;Able to live on main level with bedroom/bathroom     Bathroom Shower/Tub: Occupational psychologist: Handicapped height Bathroom Accessibility: Yes   Home Equipment: Environmental consultant - 2 wheels;Cane - single point;Adaptive equipment Adaptive Equipment: Reacher Additional Comments: 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 reports daughter and grandson will help with house and care      Prior Functioning/Environment Level of Independence: Independent        Comments: driving active        OT Problem List: Decreased strength;Decreased activity tolerance;Impaired balance (sitting and/or standing);Decreased safety awareness;Decreased knowledge of use of DME or AE;Decreased knowledge of precautions;Pain;Obesity      OT Treatment/Interventions: Self-care/ADL training;Therapeutic exercise;Neuromuscular education;DME and/or AE instruction;Energy conservation;Therapeutic activities;Patient/family education;Balance training    OT Goals(Current goals can be found in the care plan section) Acute Rehab OT Goals Patient Stated Goal: to return home for christmas OT Goal Formulation: With patient/family Time For Goal Achievement: 08/06/18 Potential to Achieve Goals: Good  OT Frequency: Min 2X/week   Barriers to D/C:            Co-evaluation              AM-PAC OT "6 Clicks" Daily Activity     Outcome Measure Help  from another person eating meals?: None Help from another person taking care of personal grooming?: None Help from another person toileting, which includes using toliet, bedpan, or urinal?: A Little Help from another person bathing (including washing, rinsing, drying)?: A Lot Help from another person to put on and taking off regular upper body clothing?: None Help from another person to put on and taking off regular lower body clothing?: A Lot 6 Click Score: 19   End of Session Equipment Utilized During Treatment: Gait belt;Rolling walker;Back brace Nurse Communication: Mobility status;Precautions  Activity Tolerance: Patient tolerated treatment well Patient left: in bed;with call bell/phone within reach;with bed alarm set;with family/visitor present  OT Visit Diagnosis: Unsteadiness on feet (R26.81);Muscle weakness (generalized) (M62.81)                Time: [id number] OT Time Calculation (min): 32 min Charges:  OT General Charges $OT Visit: 1 Visit OT Evaluation $OT Eval Moderate Complexity: 1 Mod OT Treatments $Self Care/Home Management : 8-22 mins   Brynn, OTR/L  Acute Rehabilitation Services Pager: [phone] Office: [phone] .   Jeri Modena 07/24/2019, 3:00 PM

## 2019-07-25 MED ORDER — METHOCARBAMOL 500 MG PO TABS: 500.0000 mg | ORAL_TABLET | Freq: Four times a day (QID) | ORAL | 3 refills | Status: DC | PRN

## 2019-07-25 MED ORDER — OXYCODONE-ACETAMINOPHEN 5-325 MG PO TABS: 1.0000 | ORAL_TABLET | ORAL | 0 refills | Status: DC | PRN

## 2019-07-25 NOTE — Progress Notes (Signed)
Patient was stable at discharge. I removed his IVs. We reviewed the discharge education. Patient/Family verbalized understanding and had no further questions. Patient left with belongings in hand. Daughter aware that prescriptions have been sent to pt's designated pharmacy.

## 2019-07-25 NOTE — Discharge Summary (Signed)
Physician Discharge Summary  Patient ID: [name]. MRN: [id number] DOB/AGE: 1934/07/14 85 y.o.  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  Degenerative scoliosis.  Discharge Diagnoses: Lumbar spondylosis and stenosis L2-3 L3-4 L4-5, with neurogenic claudication.  Lumbar radiculopathy.  Degenerative scoliosis. Active Problems:   Lumbar stenosis with neurogenic claudication   Discharged Condition: good  Hospital Course: Patient underwent surgical decompression via an anterolateral decompression with posterior decompression at L3-L5.  He tolerated surgery well.  He underwent fixation and fusion from L2-L5.  His incision is clean and dry.  He is ambulatory.  He is discharged home  Consults: None  Significant Diagnostic Studies: None  Treatments: surgery: Anterolateral surgery to decompress L2-3 L3-4 L4-5 with XLIF technique and allograft arthrodesis.  Posterior lateral arthrodesis L3-L5 with segmental fixation L2 to L5 decompression via laminectomy and foraminotomies.  Discharge Exam: Blood pressure 107/69, pulse 79, temperature 98.8 F (37.1 C), resp. rate 16, height 6\' 3"  (1.905 m), weight 109.8 kg, SpO2 95 %. Incision is clean and dry.  Disposition: Discharge disposition: 01-Home or Self Care       Discharge Instructions    Call MD for:  redness, tenderness, or signs of infection (pain, swelling, redness, odor or green/yellow discharge around incision site)   Complete by: As directed    Call MD for:  severe uncontrolled pain   Complete by: As directed    Call MD for:  temperature >100.4   Complete by: As directed    Diet - low sodium heart healthy   Complete by: As directed    Incentive spirometry RT   Complete by: As directed    Increase activity slowly   Complete by: As directed      Allergies as of 07/25/2019      Reactions    Metronidazole Hives, Itching   Procaine Other (See Comments)   Loss of consciousness Remarks that the caine injection for dental procedure - but reports that the anxiety might have made him pass out    Statins Other (See Comments)   Leg pain      Medication List    TAKE these medications   enalapril 20 MG tablet Commonly known as: VASOTEC TAKE 1 TABLET BY MOUTH  DAILY. PLEASE KEEP UPCOMING APPOINTMENT FOR FURTHER  REFILLS What changed: See the new instructions.   ezetimibe 10 MG tablet Commonly known as: ZETIA Take 1 tablet (10 mg total) by mouth daily.   hydrochlorothiazide 25 MG tablet Commonly known as: HYDRODIURIL Take 25 mg by mouth daily.   ibuprofen 200 MG tablet Commonly known as: ADVIL Take 400 mg by mouth 2 (two) times daily.   methocarbamol 500 MG tablet Commonly known as: ROBAXIN Take 1 tablet (500 mg total) by mouth every 6 (six) hours as needed for muscle spasms.   oxyCODONE-acetaminophen 5-325 MG tablet Commonly known as: PERCOCET/ROXICET Take 1-2 tablets by mouth every 4 (four) hours as needed for moderate pain or severe pain. What changed:   how much to take  reasons to take this   tamsulosin 0.4 MG Caps capsule Commonly known as: FLOMAX Take 0.8 mg by mouth at bedtime.   vitamin C 1000 MG tablet Take 1,000 mg by mouth daily.   Vitamin D-3 25 MCG (1000 UT) Caps Take 1,000 mg by mouth daily.            Durable Medical Equipment  (From admission, onward)  Start     Ordered   07/25/19 0909  DME Tub Bench  Once     07/25/19 [id number]           Signed: Blanchie Dessert Aasia Peavler 07/25/2019, 9:10 AM

## 2019-07-25 NOTE — Progress Notes (Signed)
Patient ID: [name]., male   DOB: Feb 11, 1934, 85 y.o.   MRN: [id number] Vital signs are stable Motor function appears good Incisions are clean and dry Drain site with minimal drainage.  We will change dressing there Okay for discharge this afternoon Stressed importance of getting bowels to move

## 2019-07-25 NOTE — Progress Notes (Signed)
Occupational Therapy Treatment Patient Details Name: [name]. MRN: [id number] DOB: 04-24-1934 Today's Date: 07/25/2019    History of present illness pt is an 85 y/o male admitted for bil radicular leg and back pain, s/p xlif L2-5 with fixation.   OT comments  Pt making steady progress towards OT goals this session. Overall, pt requires Min guard for functional mobility with RW and MIN A for LB ADL/ UB ADL. Education provided on all LB AE for bathing and dressing. Pt declined standing grooming tasks but provided education on compensatory method for standing grooming in order to maintain back precautions. Pt likely to DC home today with no OT f/u. Will continue to follow acutely per POC.    Follow Up Recommendations  No OT follow up    Equipment Recommendations  Tub/shower seat;Other (comment)(RW)    Recommendations for Other Services      Precautions / Restrictions Precautions Precautions: Back Precaution Booklet Issued: Yes (comment) Precaution Comments: able to recall 1/2 precautions, reviewed handout and reinforced precautions Required Braces or Orthoses: Spinal Brace Spinal Brace: Lumbar corset;Applied in sitting position Restrictions Weight Bearing Restrictions: No       Mobility Bed Mobility Overal bed mobility: Needs Assistance Bed Mobility: Rolling;Sidelying to Sit Rolling: Min guard Sidelying to sit: Min guard;HOB elevated       General bed mobility comments: min guard for safety, able to demo good log roll technique but reports he often sleeps in recliner. completed bed mobility with HOB elevated 16 degrees  Transfers Overall transfer level: Needs assistance   Transfers: Sit to/from Stand Sit to Stand: From elevated surface;Min guard         General transfer comment: pt request elevating bed, cues for hand placement and min guard for safety    Balance Overall balance assessment: Needs assistance Sitting-balance support: No upper extremity  supported;Feet unsupported Sitting balance-Leahy Scale: Fair Sitting balance - Comments: able to sit EOB for LB ADL with no LOB   Standing balance support: Bilateral upper extremity supported Standing balance-Leahy Scale: Poor Standing balance comment: reliant on BUE support during functional mobility                           ADL either performed or assessed with clinical judgement   ADL Overall ADL's : Needs assistance/impaired                 Upper Body Dressing : Minimal assistance;Sitting Upper Body Dressing Details (indicate cue type and reason): MIN A to don brace in sitting Lower Body Dressing: Minimal assistance;Min guard;Sit to/from stand Lower Body Dressing Details (indicate cue type and reason): education on using reacher for LB dressing, able to pull up socks from EOB without breaking back precautions Toilet Transfer: Min guard;Ambulation Toilet Transfer Details (indicate cue type and reason): simualted via functional mobility, min guard for safety with RW   Toileting - Clothing Manipulation Details (indicate cue type and reason): education on AE to use for posterior peicare or compensatory strategy to complete task, pt verbalized understanding     Functional mobility during ADLs: Min guard;Rolling walker General ADL Comments: session focus on funcitonal mobility and LB AE for bathing and dressing     Vision Baseline Vision/History: Wears glasses Wears Glasses: Reading only     Perception     Praxis      Cognition Arousal/Alertness: Awake/alert Behavior During Therapy: WFL for tasks assessed/performed Overall Cognitive Status: Within Functional Limits for tasks assessed  Exercises     Shoulder Instructions       General Comments pt only able to recall 1/3 precautions but able to demo precautions functionally into ADL routine    Pertinent Vitals/ Pain       Pain Assessment:  0-10 Pain Score: 6  Pain Location: back Pain Descriptors / Indicators: Discomfort;Grimacing;Guarding;Operative site guarding Pain Intervention(s): Monitored during session;Repositioned  Home Living                                          Prior Functioning/Environment              Frequency  Min 2X/week        Progress Toward Goals  OT Goals(current goals can now be found in the care plan section)  Progress towards OT goals: Progressing toward goals  Acute Rehab OT Goals Patient Stated Goal: to return home for christmas OT Goal Formulation: With patient/family Time For Goal Achievement: 08/06/18 Potential to Achieve Goals: Good ADL Goals Pt Will Perform Grooming: with supervision;sitting Pt Will Perform Lower Body Dressing: with supervision;sit to/from stand;with adaptive equipment Pt Will Transfer to Toilet: with supervision;ambulating;bedside commode Pt Will Perform Tub/Shower Transfer: Shower transfer;ambulating;rolling walker;with min assist Additional ADL Goal #1: Pt will complete bed mobility supervision level as precursor to adls.  Plan Discharge plan remains appropriate    Co-evaluation                 AM-PAC OT "6 Clicks" Daily Activity     Outcome Measure   Help from another person eating meals?: None Help from another person taking care of personal grooming?: None Help from another person toileting, which includes using toliet, bedpan, or urinal?: A Little Help from another person bathing (including washing, rinsing, drying)?: A Lot Help from another person to put on and taking off regular upper body clothing?: None Help from another person to put on and taking off regular lower body clothing?: A Lot 6 Click Score: 19    End of Session Equipment Utilized During Treatment: Rolling walker;Back brace  OT Visit Diagnosis: Unsteadiness on feet (R26.81);Muscle weakness (generalized) (M62.81)   Activity Tolerance Patient  tolerated treatment well   Patient Left in CPM;Other (comment)(handed off to PT)   Nurse Communication          Time: [id number] OT Time Calculation (min): 16 min  Charges: OT General Charges $OT Visit: 1 Visit OT Treatments $Self Care/Home Management : 8-22 mins  Sergio Clam., [name] Acute Rehabilitation Services [phone]    Ihor Gully 07/25/2019, 12:15 PM

## 2019-07-25 NOTE — Progress Notes (Signed)
Physical Therapy Treatment Patient Details Name: [name]. MRN: [id number] DOB: [birth year] Today's Date: 07/25/2019    History of Present Illness pt is an 85 y/o male admitted for bil radicular leg and back pain, s/p xlif L2-5 with fixation.    PT Comments    Emphasis mostly on education and gait stability/stamina.   Follow Up Recommendations  No PT follow up;Supervision/Assistance - 24 hour;Other (comment)     Equipment Recommendations  None recommended by PT;Other (comment)    Recommendations for Other Services       Precautions / Restrictions Precautions Precautions: Back Precaution Booklet Issued: Yes (comment) Precaution Comments: able to recall 1/2 precautions, reviewed handout and reinforced precautions Required Braces or Orthoses: Spinal Brace Spinal Brace: Lumbar corset;Applied in sitting position    Mobility  Bed Mobility               General bed mobility comments: OOB on arrival, but reinforced safe technique in case he decided to sleep in his bed.  Transfers Overall transfer level: Needs assistance   Transfers: Sit to/from Stand Sit to Stand: Min guard         General transfer comment: safe technique up, but needed cues to reach back for safety when sitting.  Ambulation/Gait Ambulation/Gait assistance: Supervision Gait Distance (Feet): 250 Feet Assistive device: Rolling walker (2 wheeled) Gait Pattern/deviations: Step-through pattern Gait velocity: slower   General Gait Details: still needing postural cues, still tentative and slower, but can speed up to cues.   Stairs Stairs: (deferred steps again)           Wheelchair Mobility    Modified Rankin (Stroke Patients Only)       Balance     Sitting balance-Leahy Scale: Fair       Standing balance-Leahy Scale: Fair Standing balance comment: but prefers support                            Cognition Arousal/Alertness: Awake/alert Behavior During  Therapy: WFL for tasks assessed/performed Overall Cognitive Status: Within Functional Limits for tasks assessed                                        Exercises      General Comments General comments (skin integrity, edema, etc.): reinforced precautions, lifting restrictions and progression of activity.      Pertinent Vitals/Pain Pain Assessment: 0-10 Pain Score: 6  Pain Location: back Pain Descriptors / Indicators: Discomfort;Grimacing;Guarding;Operative site guarding Pain Intervention(s): Monitored during session    Home Living                      Prior Function            PT Goals (current goals can now be found in the care plan section) Acute Rehab PT Goals Patient Stated Goal: to return home for christmas PT Goal Formulation: With patient Time For Goal Achievement: 07/30/19 Potential to Achieve Goals: Good Progress towards PT goals: Progressing toward goals    Frequency    Min 5X/week      PT Plan Current plan remains appropriate    Co-evaluation              AM-PAC PT "6 Clicks" Mobility   Outcome Measure  Help needed turning from your back to your side while in a flat bed  without using bedrails?: A Little Help needed moving from lying on your back to sitting on the side of a flat bed without using bedrails?: A Little Help needed moving to and from a bed to a chair (including a wheelchair)?: A Little Help needed standing up from a chair using your arms (e.g., wheelchair or bedside chair)?: A Little Help needed to walk in hospital room?: None Help needed climbing 3-5 steps with a railing? : A Little 6 Click Score: 19    End of Session   Activity Tolerance: Patient tolerated treatment well;Patient limited by pain Patient left: with call bell/phone within reach;in chair Nurse Communication: Mobility status PT Visit Diagnosis: Unsteadiness on feet (R26.81);Other abnormalities of gait and mobility (R26.89);Pain Pain - part  of body: (back)     Time: [id number] PT Time Calculation (min) (ACUTE ONLY): 19 min  Charges:  $Gait Training: 8-22 mins                     07/25/2019  Ginger Carne., PT Acute Rehabilitation Services [phone]  (pager) [phone]  (office)   [name] 07/25/2019, 5:43 PM

## 2019-07-25 NOTE — Plan of Care (Signed)
Plan of care goals met. Pt adequate for discharge.

## 2019-07-25 NOTE — TOC Transition Note (Signed)
Transition of Care [facility]) - CM/SW Discharge Note   Patient Details  Name: [name]. MRN: [id number] Date of Birth: Apr 24, 1934  Transition of Care [facility]) CM/SW Contact:  Ella Bodo, RN Phone Number: 07/25/2019, 10:25 AM   Clinical Narrative:  Pt is an 84 y/o male admitted for bil radicular leg and back pain, s/p xlif L2-5 with fixation. PTA, pt independent, lives alone, but plans to dc home with daughter's assistance.  PT/OT recommending no OP follow up.  Tub bench recommended, but pt states he has a walk in shower, and prefers 3 in 1 for shower use, so that insurance will reimburse.  Referral to [place] for DME needs; 3 in 1 to be delivered to bedside prior to dc home; pt states daughter to arrive at 1pm.    Final next level of care: Home/Self Care Barriers to Discharge: Barriers Resolved   Patient Goals and CMS Choice        Discharge Placement                       Discharge Plan and Services   Discharge Planning Services: CM Consult            DME Arranged: 3-N-1 DME Agency: [employer] Date DME Agency Contacted: 07/25/19 Time DME Agency Contacted: 22 Representative spoke with at DME Agency: [place] ([place]) Interventions     Readmission Risk Interventions No flowsheet data found.  Reinaldo Raddle, RN, BSN  Trauma/Neuro ICU Case Manager [phone]

## 2019-07-26 MED FILL — Sodium Chloride IV Soln 0.9%: INTRAVENOUS | Qty: 1000 | Status: AC

## 2019-07-26 MED FILL — Heparin Sodium (Porcine) Inj 1000 Unit/ML: INTRAMUSCULAR | Qty: 30 | Status: AC

## 2019-08-21 ENCOUNTER — Telehealth: Payer: Self-pay | Admitting: *Deleted

## 2019-08-21 NOTE — Telephone Encounter (Signed)
Mr.Vannice refused appointment at this time,recall move to June.

## 2019-10-24 ENCOUNTER — Other Ambulatory Visit: Payer: Self-pay | Admitting: Neurological Surgery

## 2019-10-24 DIAGNOSIS — Z981 Arthrodesis status: Secondary | ICD-10-CM

## 2019-10-25 ENCOUNTER — Other Ambulatory Visit: Payer: Self-pay

## 2019-10-25 ENCOUNTER — Ambulatory Visit
Admission: RE | Admit: 2019-10-25 | Discharge: 2019-10-25 | Disposition: A | Discharge status: Home / Self Care | Payer: Medicare Other | Source: Ambulatory Visit | Attending: Neurological Surgery | Admitting: Neurological Surgery

## 2019-10-25 DIAGNOSIS — Z981 Arthrodesis status: Secondary | ICD-10-CM

## 2019-12-16 ENCOUNTER — Other Ambulatory Visit: Payer: Self-pay | Admitting: Cardiology

## 2019-12-16 DIAGNOSIS — I10 Essential (primary) hypertension: Secondary | ICD-10-CM

## 2020-02-21 NOTE — Progress Notes (Signed)
HPI: FU coronary artery disease and AS. He underwent coronary bypass graft in December 2007, with a LIMA to LAD, saphenous vein graft to first diagonal with sequential to the circumflex, saphenous graft to the PDA with sequential to a right posterolateral. A previous Holter monitor showed brief atrial fibrillation. Abdominal CT April 2016 showed no aneurysm. CTA 1/20 showedshowed 4.4 cm thoracic aortic aneurysm.Carotid dopplers 1/20 showed 1-39 bilateral stenosis. Nuclear study 12/20 showed EF 62 with no ischemia; echo 12/20 showed normal LV function, severe LVH, grade 2 DD, moderate AS (mean gradient 29 mmHg). Since he was last seen,some dyspnea with activities.  No orthopnea, PND, pedal edema, chest pain or syncope.  Current Outpatient Medications  Medication Sig Dispense Refill  . Ascorbic Acid (VITAMIN C) 1000 MG tablet Take 1,000 mg by mouth daily.    . Cholecalciferol (VITAMIN D-3) 25 MCG (1000 UT) CAPS Take 1,000 mg by mouth daily.     . enalapril (VASOTEC) 20 MG tablet Take 1 tablet (20 mg total) by mouth daily. 90 tablet 3  . hydrochlorothiazide (HYDRODIURIL) 25 MG tablet Take 25 mg by mouth daily.     Marland Kitchen ibuprofen (ADVIL) 200 MG tablet Take 400 mg by mouth 2 (two) times daily.    . methocarbamol (ROBAXIN) 500 MG tablet Take 1 tablet (500 mg total) by mouth every 6 (six) hours as needed for muscle spasms. 40 tablet 3  . tamsulosin (FLOMAX) 0.4 MG CAPS capsule Take 0.8 mg by mouth at bedtime.     Marland Kitchen ezetimibe (ZETIA) 10 MG tablet Take 1 tablet (10 mg total) by mouth daily. (Patient not taking: Reported on 07/12/2019) 90 tablet 3  . oxyCODONE-acetaminophen (PERCOCET/ROXICET) 5-325 MG tablet Take 1-2 tablets by mouth every 4 (four) hours as needed for moderate pain or severe pain. (Patient not taking: Reported on 02/27/2020) 60 tablet 0   No current facility-administered medications for this visit.     Past Medical History:  Diagnosis Date  . Aortic stenosis   . Arthritis    OA-  lumbar  . Atrial fibrillation ([place])   . BPH (benign prostatic hypertrophy)   . BRADYCARDIA   . CAD    s/p CABG in 2007 x 5  . Carotid stenosis    0-39% bilaterally  . Complication of anesthesia    ? procaine allergy, pt. remarks that he had passing out surrounding Prostate biopsy   . Dysrhythmia    told that he had some irreg. heart rate - told its afib  . Heart murmur   . History of kidney stones 2010   at [facility] treated with cystoscopy  . HYPERCHOLESTEROLEMIA    statin intolerant  . HYPERTENSION   . Long term (current) use of anticoagulants   . NEPHROLITHIASIS   . PAF (paroxysmal atrial fibrillation) (HCC)    briefly noted on Holter monitor  . Prostate cancer ([place]) 2014   seed implant  . Skin cancer    sqaumous- top of head  . Stroke [facility])    mini stroke   . Thoracic ascending aortic aneurysm (HCC)    4.4 cm 08/21/18  . TIA Feb 2012   no residual  . TIA (transient ischemic attack)     Past Surgical History:  Procedure Laterality Date  . ANTERIOR LAT LUMBAR FUSION Left 07/23/2019   Procedure: Lumbar Two-Three, Lumbar Three-Four, Lumbar Four-Five XLIF, pedicle screw fixation, mazor;  Surgeon: Kristeen Miss, MD;  Location: [place];  Service: Neurosurgery;  Laterality: Left;  Lumbar Two-Three,  Lumbar Three-Four, Lumbar Four-Five XLIF, pedicle screw fixation, mazor  . APPENDECTOMY  1999  . APPLICATION OF ROBOTIC ASSISTANCE FOR SPINAL PROCEDURE N/A 07/23/2019   Procedure: APPLICATION OF ROBOTIC ASSISTANCE FOR SPINAL PROCEDURE;  Surgeon: Kristeen Miss, MD;  Location: [place];  Service: Neurosurgery;  Laterality: N/A;  . cardiac arrest following CABG N/A   . COLONOSCOPY    . CORONARY ARTERY BYPASS GRAFT  2007   x 5  . EYE SURGERY     cataracts removed - done at [facility], IOL in both eyes    . HERNIA REPAIR Left 1996   inguinal  . JOINT REPLACEMENT     L hip & L shoulder   . LITHOTRIPSY  2013  . LUMBAR PERCUTANEOUS PEDICLE SCREW 3 LEVEL N/A 07/23/2019   Procedure: LUMBAR  PERCUTANEOUS PEDICLE SCREW 3 LEVEL;  Surgeon: Kristeen Miss, MD;  Location: [place];  Service: Neurosurgery;  Laterality: N/A;  . RADIOACTIVE SEED IMPLANT N/A 01/06/2015   Procedure: RADIOACTIVE SEED IMPLANT/BRACHYTHERAPY IMPLANT;  Surgeon: Hollice Espy, MD;  Location: [facility];  Service: Urology;  Laterality: N/A;  . TOTAL HIP ARTHROPLASTY Left     Social History   Socioeconomic History  . Marital status: Divorced    Spouse name: Not on file  . Number of children: Not on file  . Years of education: Not on file  . Highest education level: Not on file  Occupational History  . Not on file  Tobacco Use  . Smoking status: Former Smoker    Packs/day: 1.00    Types: Cigarettes    Quit date: 11/30/1968    Years since quitting: 51.2  . Smokeless tobacco: Former Systems developer    Types: [place] date: 12/26/1987  Vaping Use  . Vaping Use: Never used  Substance and Sexual Activity  . Alcohol use: No    Alcohol/week: 0.0 standard drinks  . Drug use: No  . Sexual activity: Not Currently  Other Topics Concern  . Not on file  Social History Narrative  . Not on file   Social Determinants of Health   Financial Resource Strain:   . Difficulty of Paying Living Expenses:   Food Insecurity:   . Worried About Charity fundraiser in the Last Year:   . Arboriculturist in the Last Year:   Transportation Needs:   . Film/video editor (Medical):   Marland Kitchen Lack of Transportation (Non-Medical):   Physical Activity:   . Days of Exercise per Week:   . Minutes of Exercise per Session:   Stress:   . Feeling of Stress :   Social Connections:   . Frequency of Communication with Friends and Family:   . Frequency of Social Gatherings with Friends and Family:   . Attends Religious Services:   . Active Member of Clubs or Organizations:   . Attends Archivist Meetings:   Marland Kitchen Marital Status:   Intimate Partner Violence:   . Fear of Current or Ex-Partner:   . Emotionally Abused:   Marland Kitchen Physically  Abused:   . Sexually Abused:     Family History  Problem Relation Age of Onset  . Heart disease Mother   . Heart failure Mother   . Heart attack Father     ROS: Back pain and knee arthralgias but no fevers or chills, productive cough, hemoptysis, dysphasia, odynophagia, melena, hematochezia, dysuria, hematuria, rash, seizure activity, orthopnea, PND, pedal edema, claudication. Remaining systems are negative.  Physical Exam: Well-developed well-nourished in  no acute distress.  Skin is warm and dry.  HEENT is normal.  Neck is supple.  Chest is clear to auscultation with normal expansion.  Cardiovascular exam is regular rate and rhythm.  3/6 systolic murmur left sternal border.  S2 is diminished. Abdominal exam nontender or distended. No masses palpated. Extremities show no edema. neuro grossly intact  ECG-sinus rhythm with first-degree AV block, left anterior fascicular block, incomplete right bundle branch block, nonspecific ST changes.  Personally reviewed  A/P  1 PAF-remains in sinus; he has declined anticoagulation and understands higher risk of CVA  2 CAD-resume ASA 81 mg daily; intolerant to statins.  3 TAA-declines repeat CTA at present but will consider at follow-up visit in 6 months.  4 AS-repeat echo 12/21; no symptoms of dyspnea, CP or syncope at present.  I explained that he will likely require aortic valve replacement in the future.  Would likely be a good candidate for TAVR.  5 hypertension-BP controlled; continue present meds and follow.  Potassium and renal function monitored by primary care.  6 hyperlipidemia-intolerant to statins; continue zetia; not interested in additional lipid meds.  Lipids and liver monitored by primary care.  7 carotid artery disease-resume ASA.  Kirk Ruths, MD

## 2020-02-27 ENCOUNTER — Encounter: Payer: Self-pay | Admitting: Cardiology

## 2020-02-27 ENCOUNTER — Ambulatory Visit: Payer: Medicare Other | Admitting: Cardiology

## 2020-02-27 ENCOUNTER — Other Ambulatory Visit: Payer: Self-pay

## 2020-02-27 VITALS — BP 122/70 | HR 73 | Ht 75.0 in | Wt 251.8 lb

## 2020-02-27 DIAGNOSIS — I350 Nonrheumatic aortic (valve) stenosis: Secondary | ICD-10-CM | POA: Diagnosis not present

## 2020-02-27 DIAGNOSIS — I7121 Aneurysm of the ascending aorta, without rupture: Secondary | ICD-10-CM

## 2020-02-27 DIAGNOSIS — I2510 Atherosclerotic heart disease of native coronary artery without angina pectoris: Secondary | ICD-10-CM | POA: Diagnosis not present

## 2020-02-27 DIAGNOSIS — I712 Thoracic aortic aneurysm, without rupture: Secondary | ICD-10-CM

## 2020-02-27 DIAGNOSIS — I480 Paroxysmal atrial fibrillation: Secondary | ICD-10-CM

## 2020-02-27 NOTE — Patient Instructions (Signed)
Medication Instructions:  NO CHANGES *If you need a refill on your cardiac medications before your next appointment, please call your pharmacy*   Lab Work: None Ordered If you have labs (blood work) drawn today and your tests are completely normal, you will receive your results only by: Marland Kitchen MyChart Message (if you have MyChart) OR . A paper copy in the mail If you have any lab test that is abnormal or we need to change your treatment, we will call you to review the results.   Testing/Procedures: None Ordered   Follow-Up: At [facility], you and your health needs are our priority.  As part of our continuing mission to provide you with exceptional heart care, we have created designated Provider Care Teams.  These Care Teams include your primary Cardiologist (physician) and Advanced Practice Providers (APPs -  Physician Assistants and Nurse Practitioners) who all work together to provide you with the care you need, when you need it.  We recommend signing up for the patient portal called "MyChart".  Sign up information is provided on this After Visit Summary.  MyChart is used to connect with patients for Virtual Visits (Telemedicine).  Patients are able to view lab/test results, encounter notes, upcoming appointments, etc.  Non-urgent messages can be sent to your provider as well.   To learn more about what you can do with MyChart, go to [email or web address].    Your next appointment:   6 month(s)  The format for your next appointment:   In Person  Provider:   You may see Kirk Ruths, MD or one of the following Advanced Practice Providers on your designated Care Team:    Kerin Ransom, PA-C  [place], [place]  Coletta Memos, [place]    Other Instructions

## 2020-07-04 IMAGING — RF DG LUMBAR SPINE COMPLETE 4+V
1 series · 8 of 8 positions shown · non-contrast
Comparison: Lumbar spine CT 06/27/2019.

CLINICAL DATA: L2-5 XLIF.

EXAM:
LUMBAR SPINE - COMPLETE 4+ VIEW; DG C-ARM 1-60 MIN

[Series 1: run · 8 of 8 slices shown]
[im 1/8]
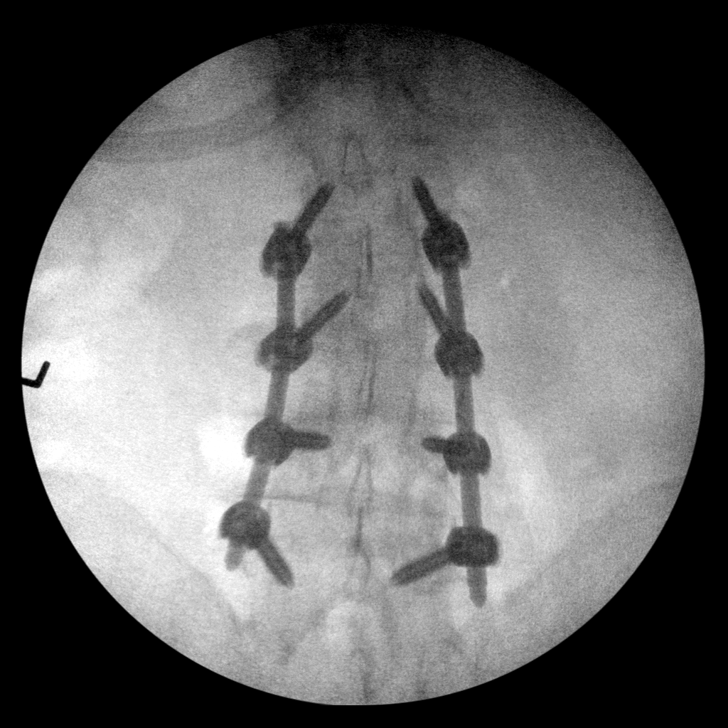
[im 2/8]
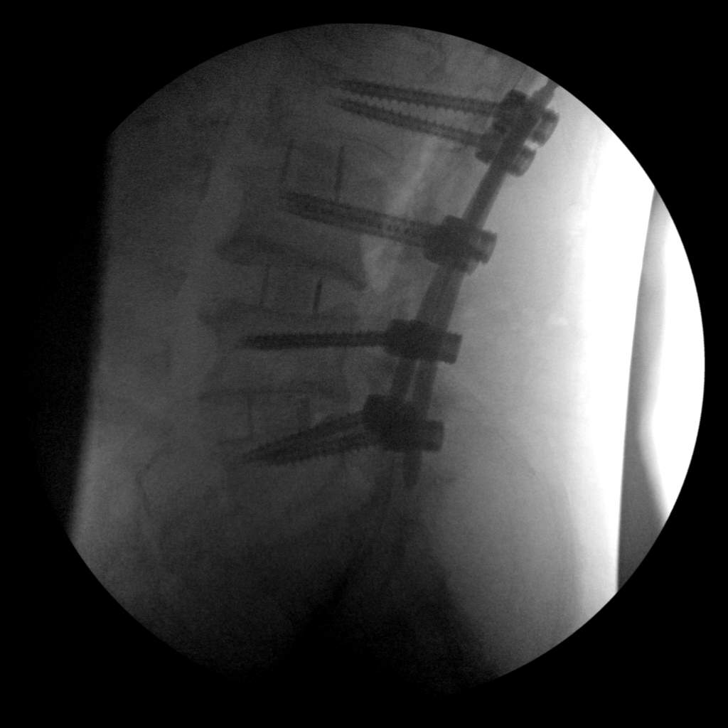
[im 3/8]
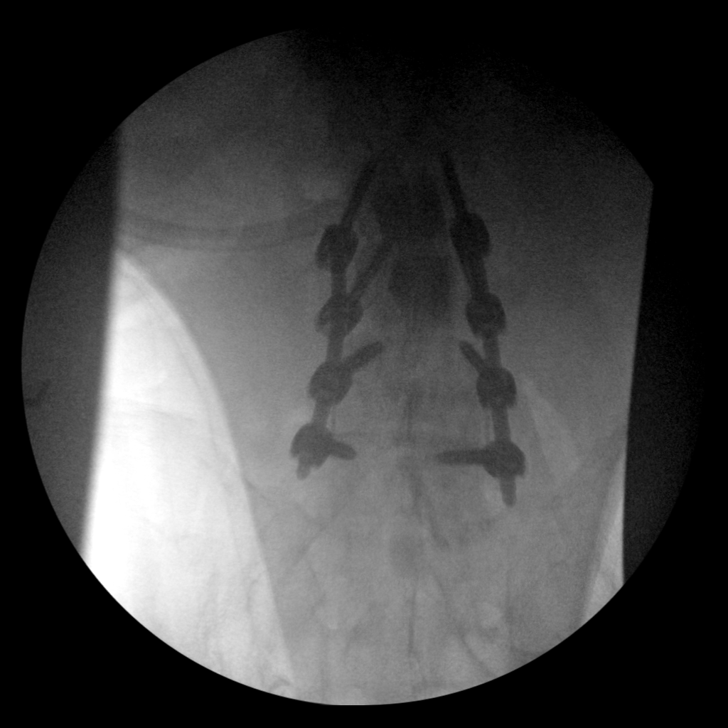
[im 4/8]
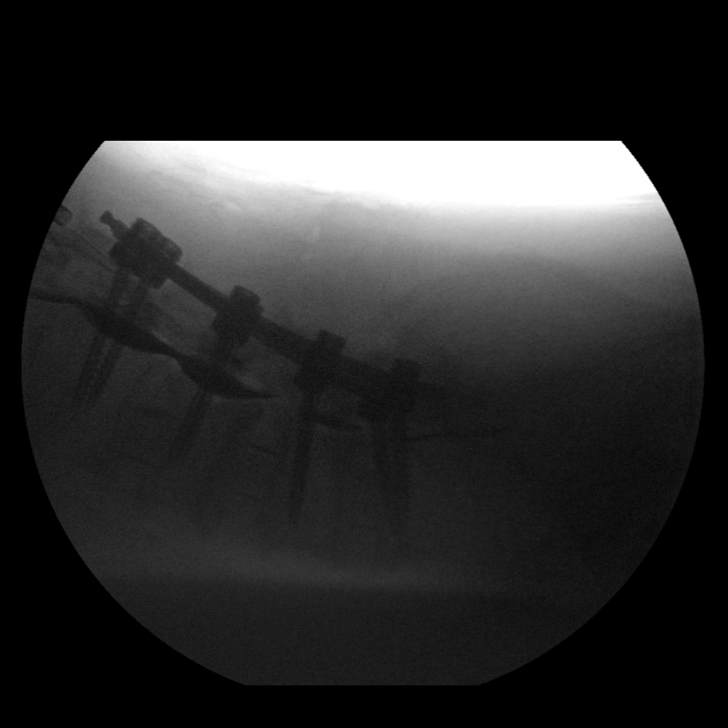
[im 5/8]
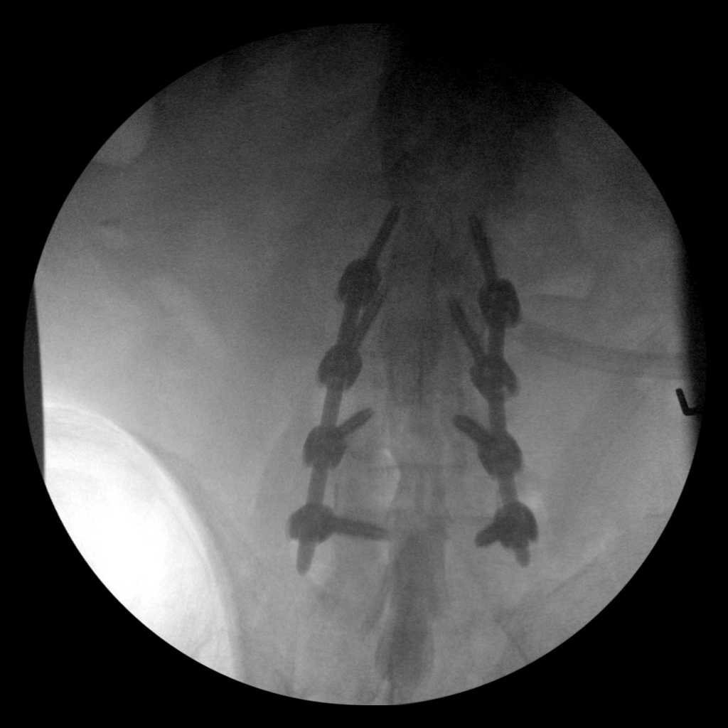
[im 6/8]
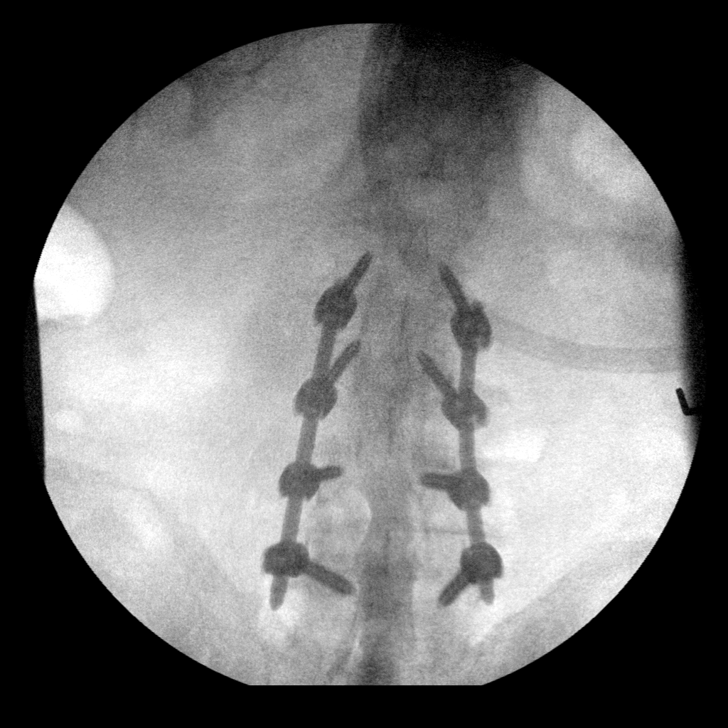
[im 7/8]
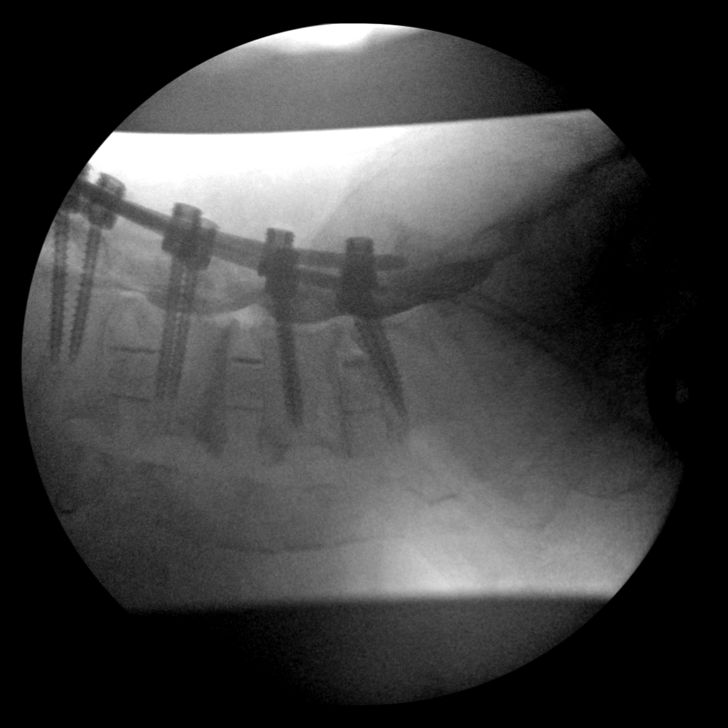
[im 8/8]
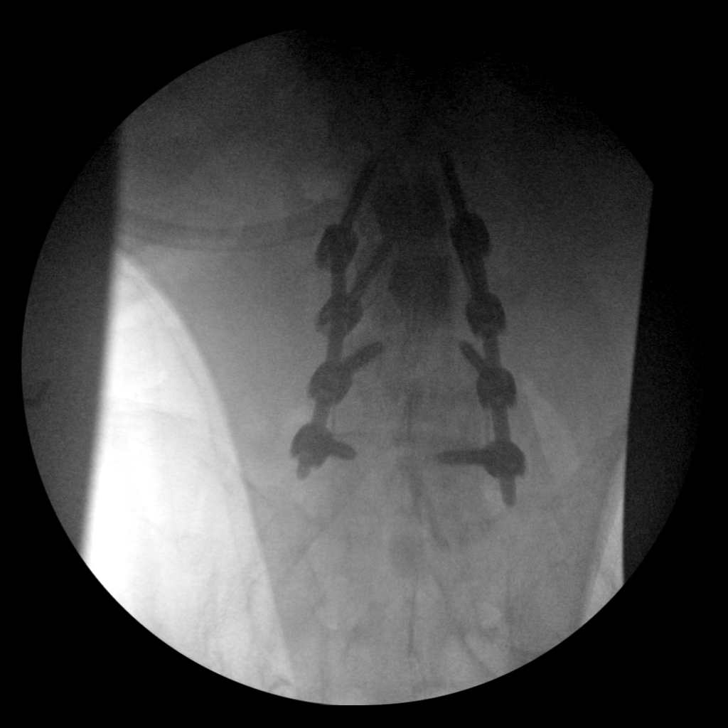

[8 of 8 positions shown; findings below may reference images not displayed]

FINDINGS: C-arm fluoroscopy was provided in the operating [HOSPITAL] minutes and
25 seconds of fluoroscopy time.

10 spot fluoroscopic images are submitted. Initial images
demonstrate XLIF at the L2-3, L3-4 and L4-5 levels. Subsequent
images demonstrate the placement of posterior pedicle screws and
interconnecting rods at the same levels. On the later images,
intrathecal contrast is present without evidence of leak. Final
images demonstrate laminectomies at the operative levels.
IMPRESSION: Intraoperative views during L2-L5 fusion. No demonstrated
complication.

## 2020-07-04 IMAGING — RF DG C-ARM 1-60 MIN
2 series · 10 of 10 positions shown · non-contrast
Comparison: Lumbar spine CT 06/27/2019.

CLINICAL DATA: L2-5 XLIF.

EXAM:
LUMBAR SPINE - COMPLETE 4+ VIEW; DG C-ARM 1-60 MIN

[Series 1: run · 8 of 8 slices shown (1 of 2)]
[im 1/8]
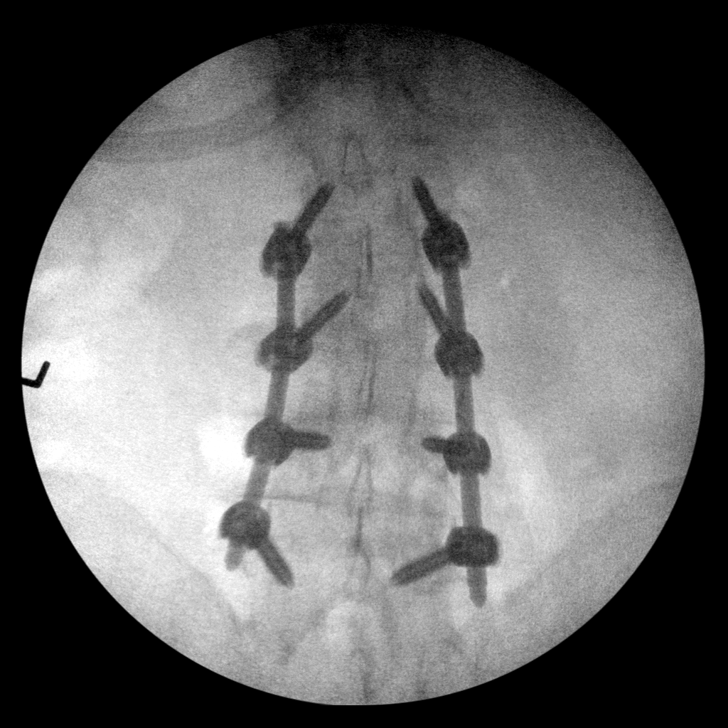
[im 2/8]
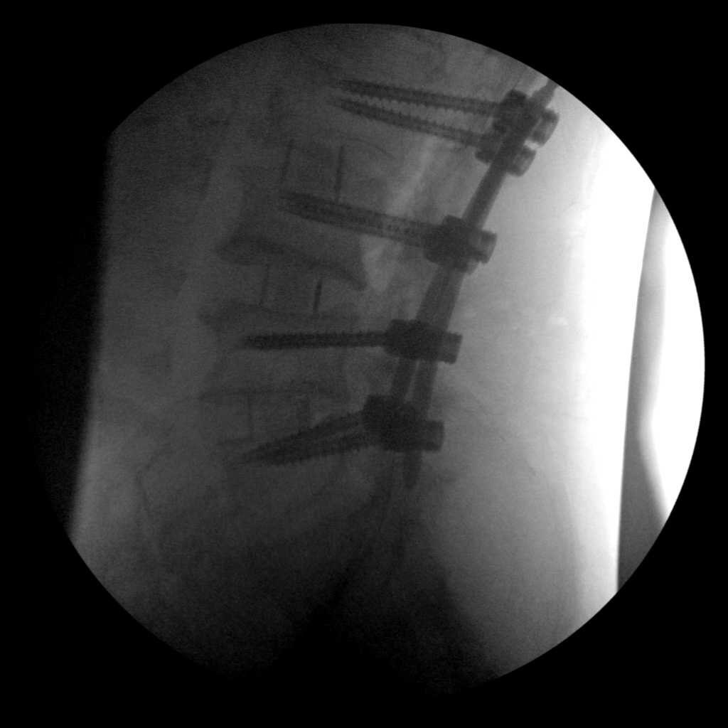
[im 3/8]
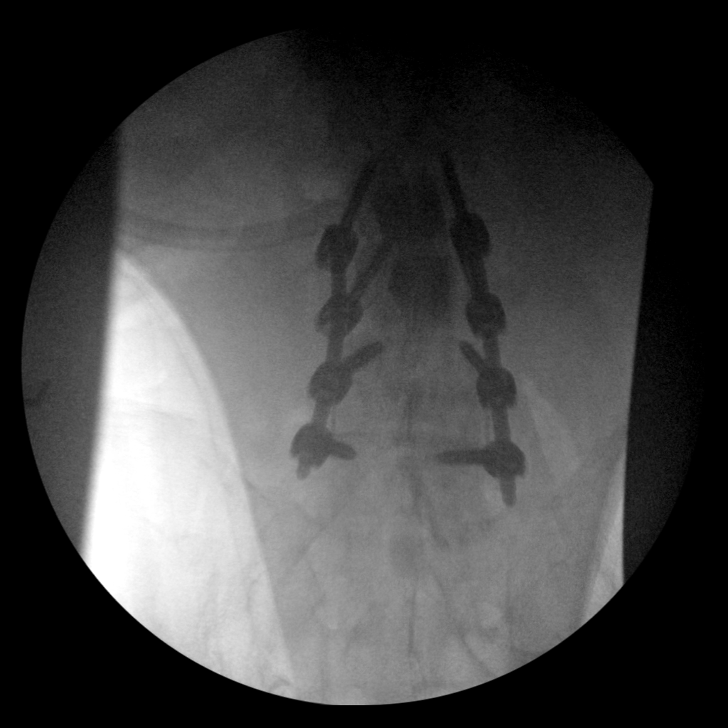
[im 4/8]
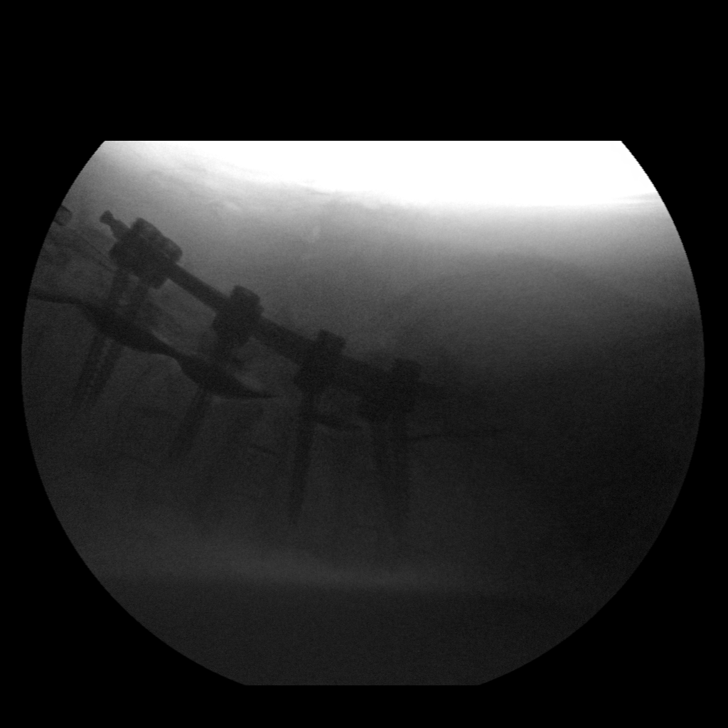
[im 5/8]
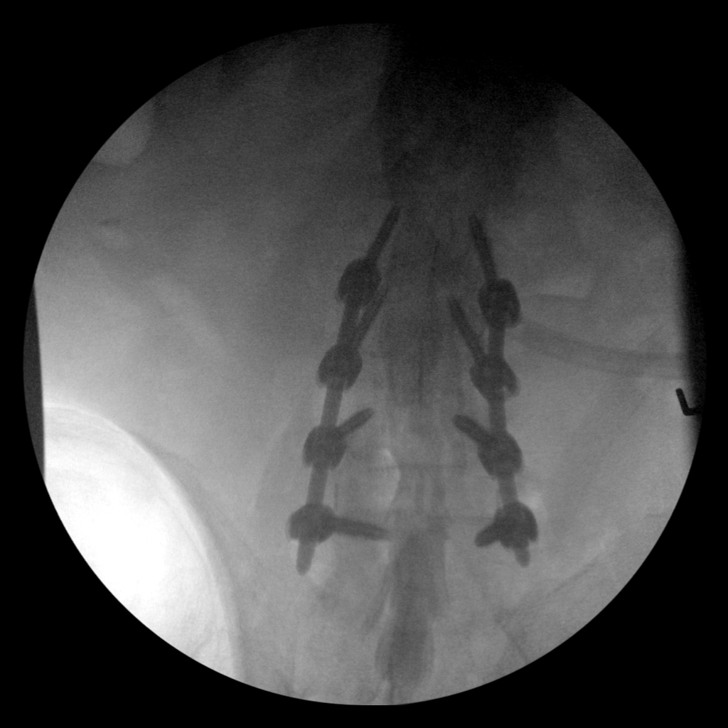
[im 6/8]
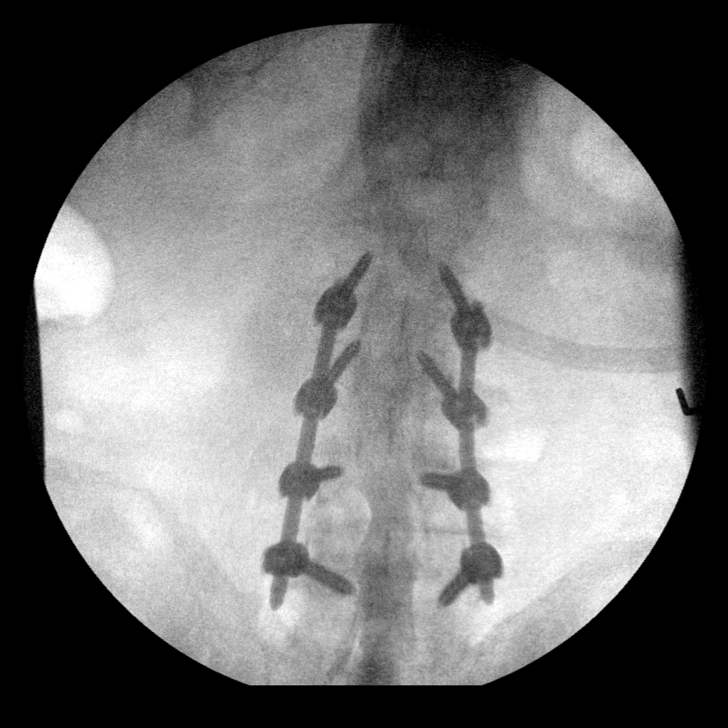
[im 7/8]
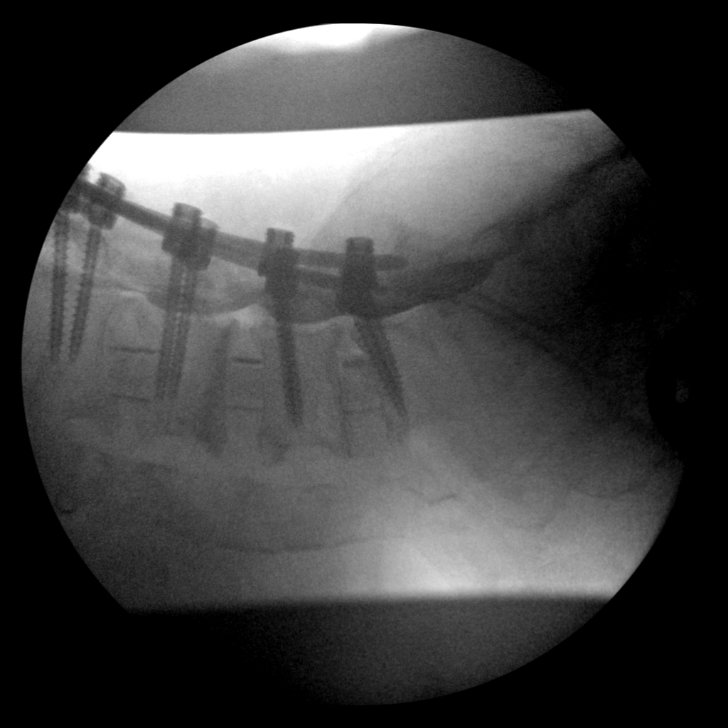
[im 8/8]
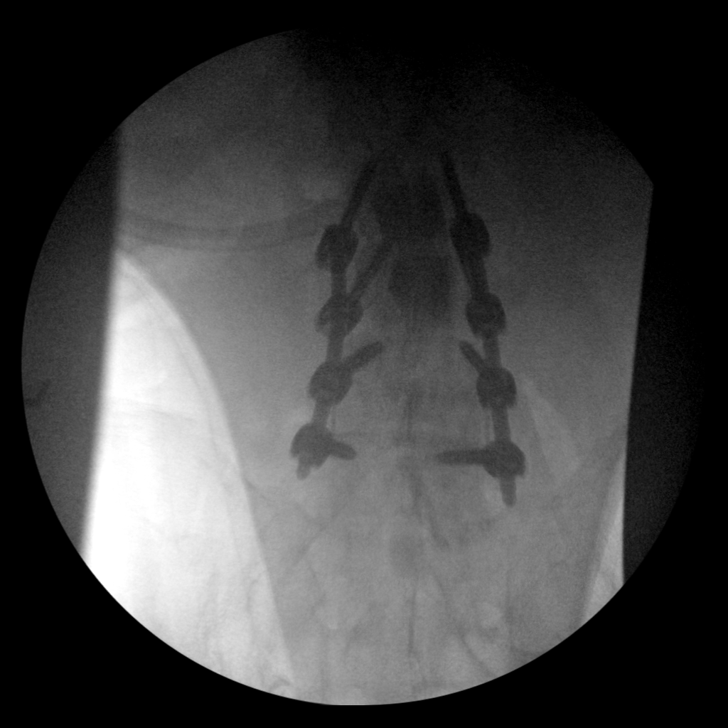

[Series 1: run · 2 of 2 slices shown (2 of 2)]
[im 1/2]
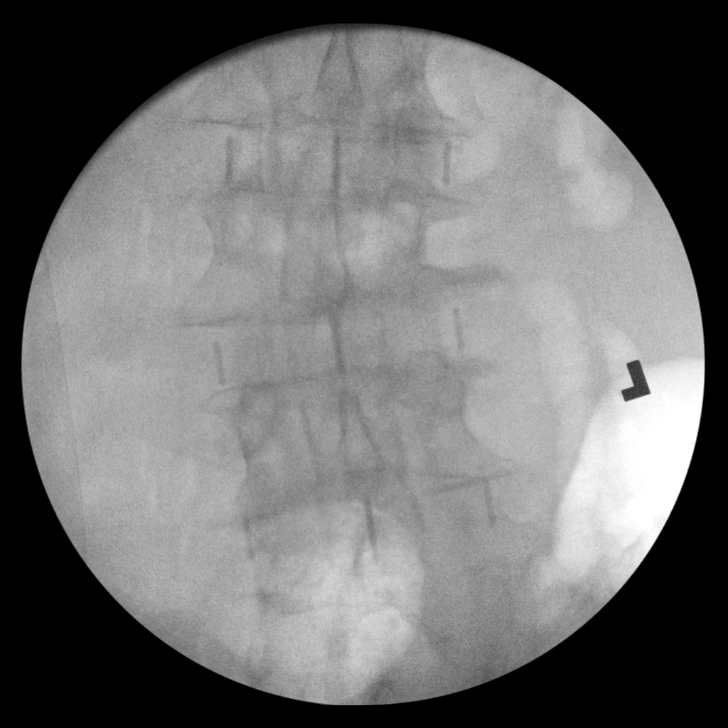
[im 2/2]
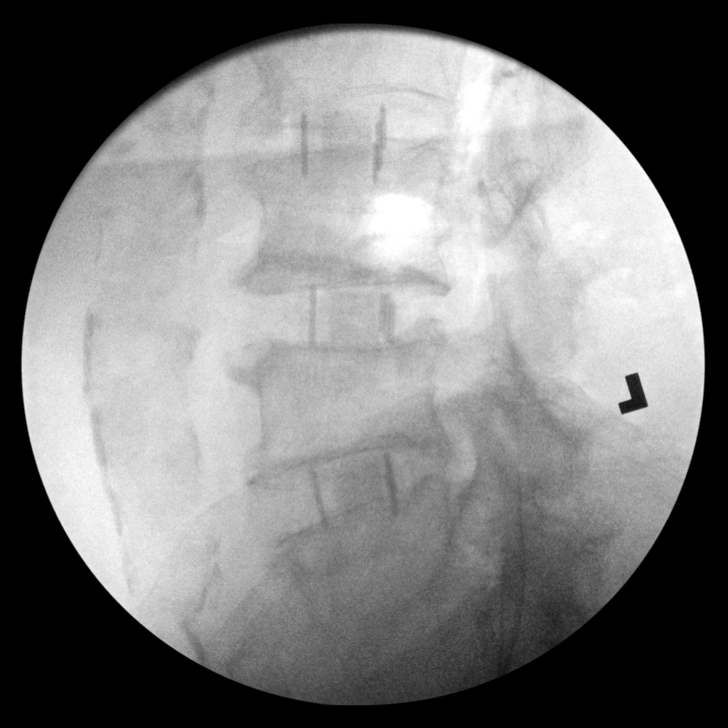

[10 of 10 positions shown; findings below may reference images not displayed]

FINDINGS: C-arm fluoroscopy was provided in the operating [HOSPITAL] minutes and
25 seconds of fluoroscopy time.

10 spot fluoroscopic images are submitted. Initial images
demonstrate XLIF at the L2-3, L3-4 and L4-5 levels. Subsequent
images demonstrate the placement of posterior pedicle screws and
interconnecting rods at the same levels. On the later images,
intrathecal contrast is present without evidence of leak. Final
images demonstrate laminectomies at the operative levels.
IMPRESSION: Intraoperative views during L2-L5 fusion. No demonstrated
complication.

## 2020-08-14 ENCOUNTER — Other Ambulatory Visit: Payer: Self-pay | Admitting: *Deleted

## 2020-08-14 DIAGNOSIS — I712 Thoracic aortic aneurysm, without rupture, unspecified: Secondary | ICD-10-CM

## 2020-08-25 NOTE — Progress Notes (Signed)
HPI: FU coronary artery disease and AS. He underwent coronary bypass graft in December 2007, with a LIMA to LAD, saphenous vein graft to first diagonal with sequential to the circumflex, saphenous graft to the PDA with sequential to a right posterolateral. A previous Holter monitor showed brief atrial fibrillation. Abdominal CT April 2016 showed no aneurysm. CTA 1/20 showedshowed 4.4 cm thoracic aortic aneurysm.Carotid dopplers 1/20 showed 1-39 bilateral stenosis.Nuclear study 12/20 showed EF 62 with no ischemia; echo 12/20 showed normal LV function, severe LVH, grade 2 DD, moderate AS (mean gradient 29 mmHg). Since he was last seen,he notes increased dyspnea on exertion particularly with more vigorous activities.  This is new compared to previous.  He denies orthopnea, PND, pedal edema, chest pain or syncope.  No palpitations.  Current Outpatient Medications  Medication Sig Dispense Refill  . Ascorbic Acid (VITAMIN C) 1000 MG tablet Take 1,000 mg by mouth daily.    . Cholecalciferol (VITAMIN D-3) 25 MCG (1000 UT) CAPS Take 1,000 mg by mouth daily.     Marland Kitchen ezetimibe (ZETIA) 10 MG tablet Take 1 tablet (10 mg total) by mouth daily. 90 tablet 3  . hydrochlorothiazide (HYDRODIURIL) 25 MG tablet Take 25 mg by mouth daily.     Marland Kitchen ibuprofen (ADVIL) 200 MG tablet Take 400 mg by mouth 2 (two) times daily.    . tamsulosin (FLOMAX) 0.4 MG CAPS capsule Take 0.8 mg by mouth at bedtime.      No current facility-administered medications for this visit.     Past Medical History:  Diagnosis Date  . Aortic stenosis   . Arthritis    OA- lumbar  . Atrial fibrillation ([place])   . BPH (benign prostatic hypertrophy)   . BRADYCARDIA   . CAD    s/p CABG in 2007 x 5  . Carotid stenosis    0-39% bilaterally  . Complication of anesthesia    ? procaine allergy, pt. remarks that he had passing out surrounding Prostate biopsy   . Dysrhythmia    told that he had some irreg. heart rate - told its afib  .  Heart murmur   . History of kidney stones 2010   at [facility] treated with cystoscopy  . HYPERCHOLESTEROLEMIA    statin intolerant  . HYPERTENSION   . Long term (current) use of anticoagulants   . NEPHROLITHIASIS   . PAF (paroxysmal atrial fibrillation) (HCC)    briefly noted on Holter monitor  . Prostate cancer ([place]) 2014   seed implant  . Skin cancer    sqaumous- top of head  . Stroke [facility])    mini stroke   . Thoracic ascending aortic aneurysm (HCC)    4.4 cm 08/21/18  . TIA Feb 2012   no residual  . TIA (transient ischemic attack)     Past Surgical History:  Procedure Laterality Date  . ANTERIOR LAT LUMBAR FUSION Left 07/23/2019   Procedure: Lumbar Two-Three, Lumbar Three-Four, Lumbar Four-Five XLIF, pedicle screw fixation, mazor;  Surgeon: Kristeen Miss, MD;  Location: [place];  Service: Neurosurgery;  Laterality: Left;  Lumbar Two-Three, Lumbar Three-Four, Lumbar Four-Five XLIF, pedicle screw fixation, mazor  . APPENDECTOMY  1999  . APPLICATION OF ROBOTIC ASSISTANCE FOR SPINAL PROCEDURE N/A 07/23/2019   Procedure: APPLICATION OF ROBOTIC ASSISTANCE FOR SPINAL PROCEDURE;  Surgeon: Kristeen Miss, MD;  Location: [place];  Service: Neurosurgery;  Laterality: N/A;  . cardiac arrest following CABG N/A   . COLONOSCOPY    . CORONARY ARTERY BYPASS  GRAFT  2007   x 5  . EYE SURGERY     cataracts removed - done at [facility], IOL in both eyes    . HERNIA REPAIR Left 1996   inguinal  . JOINT REPLACEMENT     L hip & L shoulder   . LITHOTRIPSY  2013  . LUMBAR PERCUTANEOUS PEDICLE SCREW 3 LEVEL N/A 07/23/2019   Procedure: LUMBAR PERCUTANEOUS PEDICLE SCREW 3 LEVEL;  Surgeon: Barnett Abu, MD;  Location[facility];  Service: Neurosurgery;  Laterality: N/A;  . RADIOACTIVE SEED IMPLANT N/A 01/06/2015   Procedure: RADIOACTIVE SEED IMPLANT/BRACHYTHERAPY IMPLANT;  Surgeon: Vanna Scotland, MD;  Location: [facility];  Service: Urology;  Laterality: N/A;  . TOTAL HIP ARTHROPLASTY Left     Social History    Socioeconomic History  . Marital status: Divorced    Spouse name: Not on file  . Number of children: Not on file  . Years of education: Not on file  . Highest education level: Not on file  Occupational History  . Not on file  Tobacco Use  . Smoking status: Former Smoker    Packs/day: 1.00    Types: Cigarettes    Quit date: 11/30/1968    Years since quitting: 51.8  . Smokeless tobacco: Former Neurosurgeon    Types: Chew    Quit date: 12/26/1987  Vaping Use  . Vaping Use: Never used  Substance and Sexual Activity  . Alcohol use: No    Alcohol/week: 0.0 standard drinks  . Drug use: No  . Sexual activity: Not Currently  Other Topics Concern  . Not on file  Social History Narrative  . Not on file   Social Determinants of Health   Financial Resource Strain: Not on file  Food Insecurity: Not on file  Transportation Needs: Not on file  Physical Activity: Not on file  Stress: Not on file  Social Connections: Not on file  Intimate Partner Violence: Not on file    Family History  Problem Relation Age of Onset  . Heart disease Mother   . Heart failure Mother   . Heart attack Father     ROS: Back and knee pain but no fevers or chills, productive cough, hemoptysis, dysphasia, odynophagia, melena, hematochezia, dysuria, hematuria, rash, seizure activity, orthopnea, PND, pedal edema, claudication. Remaining systems are negative.  Physical Exam: Well-developed well-nourished in no acute distress.  Skin is warm and dry.  HEENT is normal.  Neck is supple.  Chest is clear to auscultation with normal expansion.  Cardiovascular exam is regular rate and rhythm.  3/6 systolic murmur left sternal border.  S2 is diminished. Abdominal exam nontender or distended. No masses palpated. Extremities show no edema. neuro grossly intact   A/P  1 paroxysmal atrial fibrillation-he remains in sinus rhythm on exam today.  He declines anticoagulation and understands the higher risk of CVA.  2  coronary artery disease-continue aspirin.  Intolerant to statins.  3 history of thoracic aortic aneurysm-patient had follow-up CTA today with results pending.  4 aortic stenosis-patient notes increasing dyspnea on exertion which is new compared to previous.  His aortic stenosis sound severe on examination.  We will arrange an echocardiogram to reassess.  If severe he will likely need to be referred for TAVR.  He would need cardiac catheterization prior to procedure.  The risks and benefits including myocardial infarction, CVA and death discussed and he agrees to proceed if aortic stenosis is now severe.  5 hypertension-blood pressure is controlled today.  Continue present medical  regimen and follow.  6 hyperlipidemia-continue Zetia.  He is intolerant to statins and is not interested in other lipid-lowering medications.  7 carotid artery disease-continue aspirin.  Kirk Ruths, MD

## 2020-09-08 ENCOUNTER — Telehealth: Payer: Self-pay | Admitting: Cardiology

## 2020-09-08 ENCOUNTER — Other Ambulatory Visit: Payer: Self-pay

## 2020-09-08 ENCOUNTER — Ambulatory Visit: Payer: Medicare Other | Admitting: Cardiology

## 2020-09-08 ENCOUNTER — Encounter: Payer: Self-pay | Admitting: Cardiology

## 2020-09-08 ENCOUNTER — Ambulatory Visit
Admission: RE | Admit: 2020-09-08 | Discharge: 2020-09-08 | Disposition: A | Discharge status: Home / Self Care | Payer: Medicare Other | Source: Ambulatory Visit | Attending: Cardiology | Admitting: Cardiology

## 2020-09-08 VITALS — BP 125/82 | HR 92 | Ht 75.0 in | Wt 257.3 lb

## 2020-09-08 DIAGNOSIS — I350 Nonrheumatic aortic (valve) stenosis: Secondary | ICD-10-CM

## 2020-09-08 DIAGNOSIS — I712 Thoracic aortic aneurysm, without rupture, unspecified: Secondary | ICD-10-CM

## 2020-09-08 DIAGNOSIS — I2510 Atherosclerotic heart disease of native coronary artery without angina pectoris: Secondary | ICD-10-CM

## 2020-09-08 DIAGNOSIS — E7800 Pure hypercholesterolemia, unspecified: Secondary | ICD-10-CM

## 2020-09-08 DIAGNOSIS — I480 Paroxysmal atrial fibrillation: Secondary | ICD-10-CM | POA: Diagnosis not present

## 2020-09-08 MED ORDER — IOPAMIDOL (ISOVUE-370) INJECTION 76%
75.0000 mL | Freq: Once | INTRAVENOUS | Status: AC | PRN
  Administered 2020-09-08: 60 mL via INTRAVENOUS

## 2020-09-08 NOTE — Telephone Encounter (Signed)
LM for patient to schedule 3 month FU and to confirm ECHO appointment on 09/23/20.  Will mail appointment reminder for ECHO

## 2020-09-08 NOTE — Patient Instructions (Signed)
  Testing/Procedures:  Your physician has requested that you have an echocardiogram. Echocardiography is a painless test that uses sound waves to create images of your heart. It provides your doctor with information about the size and shape of your heart and how well your heart's chambers and valves are working. This procedure takes approximately one hour. There are no restrictions for this procedure.[place] OFFICE     Follow-Up: At [facility], you and your health needs are our priority.  As part of our continuing mission to provide you with exceptional heart care, we have created designated Provider Care Teams.  These Care Teams include your primary Cardiologist (physician) and Advanced Practice Providers (APPs -  Physician Assistants and Nurse Practitioners) who all work together to provide you with the care you need, when you need it.  We recommend signing up for the patient portal called "MyChart".  Sign up information is provided on this After Visit Summary.  MyChart is used to connect with patients for Virtual Visits (Telemedicine).  Patients are able to view lab/test results, encounter notes, upcoming appointments, etc.  Non-urgent messages can be sent to your provider as well.   To learn more about what you can do with MyChart, go to [email or web address].    Your next appointment:   3 month(s)  The format for your next appointment:   In Person  Provider:   Kirk Ruths, MD

## 2020-09-23 ENCOUNTER — Other Ambulatory Visit: Payer: Self-pay

## 2020-09-23 ENCOUNTER — Ambulatory Visit (INDEPENDENT_AMBULATORY_CARE_PROVIDER_SITE_OTHER): Payer: Medicare Other

## 2020-09-23 DIAGNOSIS — I350 Nonrheumatic aortic (valve) stenosis: Secondary | ICD-10-CM

## 2020-09-23 LAB — COMPREHENSIVE METABOLIC PANEL
ALT: 20 IU/L (ref 0–44)
AST: 22 IU/L (ref 0–40)
Albumin/Globulin Ratio: 2 (ref 1.2–2.2)
Albumin: 4.6 g/dL (ref 3.6–4.6)
Alkaline Phosphatase: 69 IU/L (ref 44–121)
BUN/Creatinine Ratio: 18 (ref 10–24)
BUN: 31 mg/dL — ABNORMAL HIGH (ref 8–27)
Bilirubin Total: 0.7 mg/dL (ref 0.0–1.2)
CO2: 28 mmol/L (ref 20–29)
Calcium: 9.7 mg/dL (ref 8.6–10.2)
Chloride: 99 mmol/L (ref 96–106)
Creatinine, Ser: 1.71 mg/dL — ABNORMAL HIGH (ref 0.76–1.27)
GFR calc Af Amer: 42 mL/min/{1.73_m2} — ABNORMAL LOW (ref >59)
GFR calc non Af Amer: 36 mL/min/{1.73_m2} — ABNORMAL LOW (ref >59)
Globulin, Total: 2.3 g/dL (ref 1.5–4.5)
Glucose: 108 mg/dL — ABNORMAL HIGH (ref 65–99)
Potassium: 4.8 mmol/L (ref 3.5–5.2)
Sodium: 141 mmol/L (ref 134–144)
Total Protein: 6.9 g/dL (ref 6.0–8.5)

## 2020-09-23 LAB — ECHOCARDIOGRAM COMPLETE
AR max vel: 0.73 cm2
AV Area VTI: 0.82 cm2
AV Area mean vel: 0.83 cm2
AV Mean grad: 36 mmHg
AV Peak grad: 70.2 mmHg
Ao pk vel: 4.19 m/s
Area-P 1/2: 4.31 cm2
P 1/2 time: 603 msec
S' Lateral: 3.4 cm

## 2020-09-23 LAB — LIPID PANEL
Chol/HDL Ratio: 6.1 ratio — ABNORMAL HIGH (ref 0.0–5.0)
Cholesterol, Total: 238 mg/dL — ABNORMAL HIGH (ref 100–199)
HDL: 39 mg/dL — ABNORMAL LOW (ref >39)
LDL Chol Calc (NIH): 181 mg/dL — ABNORMAL HIGH (ref 0–99)
Triglycerides: 100 mg/dL (ref 0–149)
VLDL Cholesterol Cal: 18 mg/dL (ref 5–40)

## 2020-09-23 NOTE — Progress Notes (Signed)
Complete echocardiogram performed.  Jimmy Reel RDCS, RVT  

## 2020-09-24 NOTE — Progress Notes (Signed)
HPI: FU coronary artery diseaseand AS. He underwent coronary bypass graft in December 2007, with a LIMA to LAD, saphenous vein graft to first diagonal with sequential to the circumflex, saphenous graft to the PDA with sequential to a right posterolateral. A previous Holter monitor showed brief atrial fibrillation. Abdominal CT April 2016 showed no aneurysm. CTA 1/20showedshowed 4.4 cm thoracic aortic aneurysm.Carotid dopplers 1/20showed 1-39 bilateral stenosis.Nuclear study 12/20 showed EF 62 with no ischemia. Echocardiogram February 2022 showed normal LV function, moderate left ventricular hypertrophy, grade 1 diastolic dysfunction, mild left atrial enlargement, mild aortic insufficiency, severe aortic stenosis with mean gradient 36 mmHg and aortic valve area 0.8 cm, dilated ascending aorta at 45 mm. CTA February 2022 showed 4.4 cm thoracic aortic aneurysm. Since he was last seen, he continues to note dyspnea on exertion.  No orthopnea, PND, pedal edema, chest pain or syncope.  Current Outpatient Medications  Medication Sig Dispense Refill   Ascorbic Acid (VITAMIN C) 1000 MG tablet Take 1,000 mg by mouth daily.     Cholecalciferol (VITAMIN D-3) 25 MCG (1000 UT) CAPS Take 1,000 mg by mouth daily.      ezetimibe (ZETIA) 10 MG tablet Take 1 tablet (10 mg total) by mouth daily. 90 tablet 3   hydrochlorothiazide (HYDRODIURIL) 25 MG tablet Take 25 mg by mouth daily.      ibuprofen (ADVIL) 200 MG tablet Take 400 mg by mouth 2 (two) times daily.     tamsulosin (FLOMAX) 0.4 MG CAPS capsule Take 0.8 mg by mouth at bedtime.      No current facility-administered medications for this visit.     Past Medical History:  Diagnosis Date   Aortic stenosis    Arthritis    OA- lumbar   Atrial fibrillation (HCC)    BPH (benign prostatic hypertrophy)    BRADYCARDIA    CAD    s/p CABG in 2007 x 5   Carotid stenosis    0-39% bilaterally   Complication of anesthesia    ? procaine  allergy, pt. remarks that he had passing out surrounding Prostate biopsy    Dysrhythmia    told that he had some irreg. heart rate - told its afib   Heart murmur    History of kidney stones 2010   at [facility] treated with cystoscopy   HYPERCHOLESTEROLEMIA    statin intolerant   HYPERTENSION    Long term (current) use of anticoagulants    NEPHROLITHIASIS    PAF (paroxysmal atrial fibrillation) ([place])    briefly noted on Holter monitor   Prostate cancer ([place]) 2014   seed implant   Skin cancer    sqaumous- top of head   Stroke ([place])    mini stroke    Thoracic ascending aortic aneurysm ([place])    4.4 cm 08/21/18   TIA Feb 2012   no residual   TIA (transient ischemic attack)     Past Surgical History:  Procedure Laterality Date   ANTERIOR LAT LUMBAR FUSION Left 07/23/2019   Procedure: Lumbar Two-Three, Lumbar Three-Four, Lumbar Four-Five XLIF, pedicle screw fixation, mazor;  Surgeon: Kristeen Miss, MD;  Location: [place];  Service: Neurosurgery;  Laterality: Left;  Lumbar Two-Three, Lumbar Three-Four, Lumbar Four-Five XLIF, pedicle screw fixation, mazor   APPENDECTOMY  8280   APPLICATION OF ROBOTIC ASSISTANCE FOR SPINAL PROCEDURE N/A 07/23/2019   Procedure: APPLICATION OF ROBOTIC ASSISTANCE FOR SPINAL PROCEDURE;  Surgeon: Kristeen Miss, MD;  Location: [place];  Service: Neurosurgery;  Laterality: N/A;  cardiac arrest following CABG N/A    COLONOSCOPY     CORONARY ARTERY BYPASS GRAFT  2007   x 5   EYE SURGERY     cataracts removed - done at [facility], IOL in both eyes     HERNIA REPAIR Left 1996   inguinal   JOINT REPLACEMENT     L hip & L shoulder    LITHOTRIPSY  2013   LUMBAR PERCUTANEOUS PEDICLE SCREW 3 LEVEL N/A 07/23/2019   Procedure: LUMBAR PERCUTANEOUS PEDICLE SCREW 3 LEVEL;  Surgeon: Kristeen Miss, MD;  Location: [place];  Service: Neurosurgery;  Laterality: N/A;   RADIOACTIVE SEED IMPLANT N/A 01/06/2015   Procedure: RADIOACTIVE SEED IMPLANT/BRACHYTHERAPY  IMPLANT;  Surgeon: Hollice Espy, MD;  Location: [facility];  Service: Urology;  Laterality: N/A;   TOTAL HIP ARTHROPLASTY Left     Social History   Socioeconomic History   Marital status: Divorced    Spouse name: Not on file   Number of children: Not on file   Years of education: Not on file   Highest education level: Not on file  Occupational History   Not on file  Tobacco Use   Smoking status: Former Smoker    Packs/day: 1.00    Types: Cigarettes    Quit date: 11/30/1968    Years since quitting: 51.8   Smokeless tobacco: Former Systems developer    Types: Chew    Quit date: 12/26/1987  Vaping Use   Vaping Use: Never used  Substance and Sexual Activity   Alcohol use: No    Alcohol/week: 0.0 standard drinks   Drug use: No   Sexual activity: Not Currently  Other Topics Concern   Not on file  Social History Narrative   Not on file   Social Determinants of Health   Financial Resource Strain: Not on file  Food Insecurity: Not on file  Transportation Needs: Not on file  Physical Activity: Not on file  Stress: Not on file  Social Connections: Not on file  Intimate Partner Violence: Not on file    Family History  Problem Relation Age of Onset   Heart disease Mother    Heart failure Mother    Heart attack Father     ROS: Back pain but no fevers or chills, productive cough, hemoptysis, dysphasia, odynophagia, melena, hematochezia, dysuria, hematuria, rash, seizure activity, orthopnea, PND, pedal edema, claudication. Remaining systems are negative.  Physical Exam: Well-developed well-nourished in no acute distress.  Skin is warm and dry.  HEENT is normal.  Neck is supple.  Chest is clear to auscultation with normal expansion.  Cardiovascular exam is regular rate and rhythm.  3/6 systolic murmur left sternal border, S2 is diminished. Abdominal exam nontender or distended. No masses palpated. Extremities show no edema. neuro grossly intact  ECG-normal sinus  rhythm at a rate of 79, first-degree AV block, cannot rule out prior inferior infarct.  Personally reviewed  A/P  1 aortic stenosis-follow-up echocardiogram shows severe aortic stenosis. He notes continued dyspnea on exertion which is new compared to previous. We will arrange right and left cardiac catheterization with either Dr. Angelena Form or Dr. Burt Knack. The risks and benefits of cardiac catheterization including CVA, myocardial infarction and death discussed and he agrees to proceed. Work-up for TAVR will follow.  Note recent creatinine 1.71.  Will hold hydrochlorothiazide 2 days prior to the procedure and the day of the procedure.  Limit diet.  Check potassium and renal function 1 week after procedure.  We will also gently hydrate  prior to the procedure.  2 paroxysmal atrial fibrillation-patient remains in sinus rhythm on exam. He continues to decline anticoagulation understanding the higher risk of CVA.  3 coronary artery disease-continue aspirin. Patient is intolerant to statins.  4 thoracic aortic aneurysm-follow-up CTA showed no change in size compared to previous. We will plan follow-up study February 2023.  5 hypertension-blood pressure controlled. Continue present medications.  6 hyperlipidemia-continue Zetia. Note he is intolerant to statins and is not interested in pursuing other lipid-lowering medications.  7 carotid artery disease-continue aspirin.  8 chronic stage III kidney disease-hold hydrochlorothiazide prior to the procedure as outlined above.  Follow renal function closely afterwards.  Kirk Ruths, MD

## 2020-09-24 NOTE — H&P (View-Only) (Signed)
HPI: FU coronary artery diseaseand AS. He underwent coronary bypass graft in December 2007, with a LIMA to LAD, saphenous vein graft to first diagonal with sequential to the circumflex, saphenous graft to the PDA with sequential to a right posterolateral. A previous Holter monitor showed brief atrial fibrillation. Abdominal CT April 2016 showed no aneurysm. CTA 1/20showedshowed 4.4 cm thoracic aortic aneurysm.Carotid dopplers 1/20showed 1-39 bilateral stenosis.Nuclear study 12/20 showed EF 62 with no ischemia. Echocardiogram February 2022 showed normal LV function, moderate left ventricular hypertrophy, grade 1 diastolic dysfunction, mild left atrial enlargement, mild aortic insufficiency, severe aortic stenosis with mean gradient 36 mmHg and aortic valve area 0.8 cm, dilated ascending aorta at 45 mm. CTA February 2022 showed 4.4 cm thoracic aortic aneurysm. Since he was last seen, he continues to note dyspnea on exertion.  No orthopnea, PND, pedal edema, chest pain or syncope.  Current Outpatient Medications  Medication Sig Dispense Refill  . Ascorbic Acid (VITAMIN C) 1000 MG tablet Take 1,000 mg by mouth daily.    . Cholecalciferol (VITAMIN D-3) 25 MCG (1000 UT) CAPS Take 1,000 mg by mouth daily.     Marland Kitchen ezetimibe (ZETIA) 10 MG tablet Take 1 tablet (10 mg total) by mouth daily. 90 tablet 3  . hydrochlorothiazide (HYDRODIURIL) 25 MG tablet Take 25 mg by mouth daily.     Marland Kitchen ibuprofen (ADVIL) 200 MG tablet Take 400 mg by mouth 2 (two) times daily.    . tamsulosin (FLOMAX) 0.4 MG CAPS capsule Take 0.8 mg by mouth at bedtime.      No current facility-administered medications for this visit.     Past Medical History:  Diagnosis Date  . Aortic stenosis   . Arthritis    OA- lumbar  . Atrial fibrillation ([place])   . BPH (benign prostatic hypertrophy)   . BRADYCARDIA   . CAD    s/p CABG in 2007 x 5  . Carotid stenosis    0-39% bilaterally  . Complication of anesthesia    ? procaine  allergy, pt. remarks that he had passing out surrounding Prostate biopsy   . Dysrhythmia    told that he had some irreg. heart rate - told its afib  . Heart murmur   . History of kidney stones 2010   at [facility] treated with cystoscopy  . HYPERCHOLESTEROLEMIA    statin intolerant  . HYPERTENSION   . Long term (current) use of anticoagulants   . NEPHROLITHIASIS   . PAF (paroxysmal atrial fibrillation) (HCC)    briefly noted on Holter monitor  . Prostate cancer ([place]) 2014   seed implant  . Skin cancer    sqaumous- top of head  . Stroke [facility])    mini stroke   . Thoracic ascending aortic aneurysm (HCC)    4.4 cm 08/21/18  . TIA Feb 2012   no residual  . TIA (transient ischemic attack)     Past Surgical History:  Procedure Laterality Date  . ANTERIOR LAT LUMBAR FUSION Left 07/23/2019   Procedure: Lumbar Two-Three, Lumbar Three-Four, Lumbar Four-Five XLIF, pedicle screw fixation, mazor;  Surgeon: Kristeen Miss, MD;  Location: [facility] [facility];  Service: Neurosurgery;  Laterality: Left;  Lumbar Two-Three, Lumbar Three-Four, Lumbar Four-Five XLIF, pedicle screw fixation, mazor  . APPENDECTOMY  1999  . APPLICATION OF ROBOTIC ASSISTANCE FOR SPINAL PROCEDURE N/A 07/23/2019   Procedure: APPLICATION OF ROBOTIC ASSISTANCE FOR SPINAL PROCEDURE;  Surgeon: Kristeen Miss, MD;  Location: [facility];  Service: Neurosurgery;  Laterality: N/A;  .  cardiac arrest following CABG N/A   . COLONOSCOPY    . CORONARY ARTERY BYPASS GRAFT  2007   x 5  . EYE SURGERY     cataracts removed - done at [facility], IOL in both eyes    . HERNIA REPAIR Left 1996   inguinal  . JOINT REPLACEMENT     L hip & L shoulder   . LITHOTRIPSY  2013  . LUMBAR PERCUTANEOUS PEDICLE SCREW 3 LEVEL N/A 07/23/2019   Procedure: LUMBAR PERCUTANEOUS PEDICLE SCREW 3 LEVEL;  Surgeon: Kristeen Miss, MD;  Location: [facility];  Service: Neurosurgery;  Laterality: N/A;  . RADIOACTIVE SEED IMPLANT N/A 01/06/2015   Procedure: RADIOACTIVE SEED IMPLANT/BRACHYTHERAPY  IMPLANT;  Surgeon: Hollice Espy, MD;  Location: [facility];  Service: Urology;  Laterality: N/A;  . TOTAL HIP ARTHROPLASTY Left     Social History   Socioeconomic History  . Marital status: Divorced    Spouse name: Not on file  . Number of children: Not on file  . Years of education: Not on file  . Highest education level: Not on file  Occupational History  . Not on file  Tobacco Use  . Smoking status: Former Smoker    Packs/day: 1.00    Types: Cigarettes    Quit date: 11/30/1968    Years since quitting: 51.8  . Smokeless tobacco: Former Systems developer    Types: [place] date: 12/26/1987  Vaping Use  . Vaping Use: Never used  Substance and Sexual Activity  . Alcohol use: No    Alcohol/week: 0.0 standard drinks  . Drug use: No  . Sexual activity: Not Currently  Other Topics Concern  . Not on file  Social History Narrative  . Not on file   Social Determinants of Health   Financial Resource Strain: Not on file  Food Insecurity: Not on file  Transportation Needs: Not on file  Physical Activity: Not on file  Stress: Not on file  Social Connections: Not on file  Intimate Partner Violence: Not on file    Family History  Problem Relation Age of Onset  . Heart disease Mother   . Heart failure Mother   . Heart attack Father     ROS: Back pain but no fevers or chills, productive cough, hemoptysis, dysphasia, odynophagia, melena, hematochezia, dysuria, hematuria, rash, seizure activity, orthopnea, PND, pedal edema, claudication. Remaining systems are negative.  Physical Exam: Well-developed well-nourished in no acute distress.  Skin is warm and dry.  HEENT is normal.  Neck is supple.  Chest is clear to auscultation with normal expansion.  Cardiovascular exam is regular rate and rhythm.  3/6 systolic murmur left sternal border, S2 is diminished. Abdominal exam nontender or distended. No masses palpated. Extremities show no edema. neuro grossly intact  ECG-normal sinus  rhythm at a rate of 79, first-degree AV block, cannot rule out prior inferior infarct.  Personally reviewed  A/P  1 aortic stenosis-follow-up echocardiogram shows severe aortic stenosis. He notes continued dyspnea on exertion which is new compared to previous. We will arrange right and left cardiac catheterization with either Dr. Angelena Form or Dr. Burt Knack. The risks and benefits of cardiac catheterization including CVA, myocardial infarction and death discussed and he agrees to proceed. Work-up for TAVR will follow.  Note recent creatinine 1.71.  Will hold hydrochlorothiazide 2 days prior to the procedure and the day of the procedure.  Limit diet.  Check potassium and renal function 1 week after procedure.  We will also gently hydrate  prior to the procedure.  2 paroxysmal atrial fibrillation-patient remains in sinus rhythm on exam. He continues to decline anticoagulation understanding the higher risk of CVA.  3 coronary artery disease-continue aspirin. Patient is intolerant to statins.  4 thoracic aortic aneurysm-follow-up CTA showed no change in size compared to previous. We will plan follow-up study February 2023.  5 hypertension-blood pressure controlled. Continue present medications.  6 hyperlipidemia-continue Zetia. Note he is intolerant to statins and is not interested in pursuing other lipid-lowering medications.  7 carotid artery disease-continue aspirin.  8 chronic stage III kidney disease-hold hydrochlorothiazide prior to the procedure as outlined above.  Follow renal function closely afterwards.  Kirk Ruths, MD

## 2020-09-26 ENCOUNTER — Ambulatory Visit: Payer: Medicare Other | Admitting: Cardiology

## 2020-09-26 ENCOUNTER — Other Ambulatory Visit: Payer: Self-pay | Admitting: *Deleted

## 2020-09-26 ENCOUNTER — Other Ambulatory Visit: Payer: Self-pay

## 2020-09-26 ENCOUNTER — Encounter: Payer: Self-pay | Admitting: Cardiology

## 2020-09-26 VITALS — BP 130/68 | HR 79 | Ht 75.0 in | Wt 256.4 lb

## 2020-09-26 DIAGNOSIS — E7800 Pure hypercholesterolemia, unspecified: Secondary | ICD-10-CM

## 2020-09-26 DIAGNOSIS — I350 Nonrheumatic aortic (valve) stenosis: Secondary | ICD-10-CM

## 2020-09-26 DIAGNOSIS — I480 Paroxysmal atrial fibrillation: Secondary | ICD-10-CM

## 2020-09-26 DIAGNOSIS — I712 Thoracic aortic aneurysm, without rupture, unspecified: Secondary | ICD-10-CM

## 2020-09-26 DIAGNOSIS — I2510 Atherosclerotic heart disease of native coronary artery without angina pectoris: Secondary | ICD-10-CM | POA: Diagnosis not present

## 2020-09-26 LAB — CBC
Hematocrit: 49.2 % (ref 37.5–51.0)
Hemoglobin: 16.5 g/dL (ref 13.0–17.7)
MCH: 30.1 pg (ref 26.6–33.0)
MCHC: 33.5 g/dL (ref 31.5–35.7)
MCV: 90 fL (ref 79–97)
Platelets: 248 10*3/uL (ref 150–450)
RBC: 5.48 x10E6/uL (ref 4.14–5.80)
RDW: 12.7 % (ref 11.6–15.4)
WBC: 7.2 10*3/uL (ref 3.4–10.8)

## 2020-09-26 MED ORDER — SODIUM CHLORIDE 0.9% FLUSH
3.0000 mL | Freq: Two times a day (BID) | INTRAVENOUS | Status: AC
Start: 2020-09-26 — End: ?

## 2020-09-26 NOTE — Patient Instructions (Addendum)
    [address] [address] Dept: [phone] Loc: [phone]  [name].  09/26/2020   GO TO [address] [place] Saturday 2/26 @ 12 NOON FOR COVID TESTING  You are scheduled for a Cardiac Catheterization on Wednesday, March 2 with Dr. Sherren Mocha.  1. Please arrive at the [facility] (Main Entrance A) at [facility]: [address] [place], [place] [address] at 7:30 AM (This time is two hours before your procedure to ensure your preparation). Free valet parking service is available.   Special note: Every effort is made to have your procedure done on time. Please understand that emergencies sometimes delay scheduled procedures.  2. Diet: Do not eat solid foods after midnight.  The patient may have clear liquids until 5am upon the day of the procedure.  3. Labs: You will need to have blood drawn on TODAY  4. Medication instructions in preparation for your procedure:  DO NOT TAKE HCTZ  Monday 2/28, Tuesday 3/1 OR Wednesday 3/3  On the morning of your procedure, take your Aspirin and any morning medicines NOT listed above.  You may use sips of water.  5. Plan for one night stay--bring personal belongings. 6. Bring a current list of your medications and current insurance cards. 7. You MUST have a responsible person to drive you home. 8. Someone MUST be with you the first 24 hours after you arrive home or your discharge will be delayed. 9. Please wear clothes that are easy to get on and off and wear slip-on shoes.  Thank you for allowing [place] to care for you!   -- [facility]   Your physician recommends that you schedule a follow-up appointment in: [place]

## 2020-09-27 ENCOUNTER — Other Ambulatory Visit (HOSPITAL_COMMUNITY)
Admission: RE | Admit: 2020-09-27 | Discharge: 2020-09-27 | Disposition: A | Discharge status: Home / Self Care | Payer: Medicare Other | Source: Ambulatory Visit | Attending: Cardiovascular Disease | Admitting: Cardiovascular Disease

## 2020-09-27 DIAGNOSIS — Z20822 Contact with and (suspected) exposure to covid-19: Secondary | ICD-10-CM | POA: Diagnosis not present

## 2020-09-27 DIAGNOSIS — Z01812 Encounter for preprocedural laboratory examination: Secondary | ICD-10-CM | POA: Diagnosis present

## 2020-09-27 LAB — SARS CORONAVIRUS 2 (TAT 6-24 HRS): SARS Coronavirus 2: NEGATIVE

## 2020-09-30 ENCOUNTER — Telehealth: Payer: Self-pay | Admitting: *Deleted

## 2020-09-30 NOTE — Telephone Encounter (Signed)
Pt contacted pre-catheterization scheduled at [facility] for: Wednesday October 01, 2020 1 PM Verified arrival time and place: [facility]) at: 8 AM-pre-procedure hydration   No solid food after midnight prior to cath, clear liquids until 5 AM day of procedure.  Hold: HCTZ-none 09/29/20 until post procedure-see notes  Except hold medications AM meds can be  taken pre-cath with sips of water including: ASA 81 mg   Confirmed patient has responsible adult to drive home post procedure and be with patient first 24 hours after arriving home: yes  You are allowed ONE visitor in the waiting room during the time you are at the hospital for your procedure. Both you and your visitor must wear a mask once you enter the hospital.  Reviewed procedure/mask/visitor instructions with patient, discussed pre-procedure hydration with patient.

## 2020-10-01 ENCOUNTER — Other Ambulatory Visit: Payer: Self-pay

## 2020-10-01 ENCOUNTER — Ambulatory Visit (HOSPITAL_COMMUNITY)
Admission: RE | Disposition: A | Discharge status: Home / Self Care | Payer: Self-pay | Source: Home / Self Care | Attending: Cardiovascular Disease

## 2020-10-01 ENCOUNTER — Ambulatory Visit (HOSPITAL_COMMUNITY)
Admission: RE | Admit: 2020-10-01 | Discharge: 2020-10-01 | Disposition: A | Discharge status: Home / Self Care | Payer: Medicare Other | Attending: Cardiovascular Disease | Admitting: Cardiovascular Disease

## 2020-10-01 DIAGNOSIS — R0609 Other forms of dyspnea: Secondary | ICD-10-CM | POA: Insufficient documentation

## 2020-10-01 DIAGNOSIS — I629 Nontraumatic intracranial hemorrhage, unspecified: Secondary | ICD-10-CM | POA: Diagnosis not present

## 2020-10-01 DIAGNOSIS — I25810 Atherosclerosis of coronary artery bypass graft(s) without angina pectoris: Secondary | ICD-10-CM

## 2020-10-01 DIAGNOSIS — Z79899 Other long term (current) drug therapy: Secondary | ICD-10-CM | POA: Diagnosis not present

## 2020-10-01 DIAGNOSIS — I480 Paroxysmal atrial fibrillation: Secondary | ICD-10-CM | POA: Diagnosis not present

## 2020-10-01 DIAGNOSIS — E785 Hyperlipidemia, unspecified: Secondary | ICD-10-CM | POA: Insufficient documentation

## 2020-10-01 DIAGNOSIS — I712 Thoracic aortic aneurysm, without rupture: Secondary | ICD-10-CM | POA: Diagnosis not present

## 2020-10-01 DIAGNOSIS — I2510 Atherosclerotic heart disease of native coronary artery without angina pectoris: Secondary | ICD-10-CM | POA: Diagnosis not present

## 2020-10-01 DIAGNOSIS — I129 Hypertensive chronic kidney disease with stage 1 through stage 4 chronic kidney disease, or unspecified chronic kidney disease: Secondary | ICD-10-CM | POA: Insufficient documentation

## 2020-10-01 DIAGNOSIS — Z87891 Personal history of nicotine dependence: Secondary | ICD-10-CM | POA: Insufficient documentation

## 2020-10-01 DIAGNOSIS — I350 Nonrheumatic aortic (valve) stenosis: Secondary | ICD-10-CM | POA: Diagnosis present

## 2020-10-01 DIAGNOSIS — N183 Chronic kidney disease, stage 3 unspecified: Secondary | ICD-10-CM | POA: Insufficient documentation

## 2020-10-01 HISTORY — PX: RIGHT HEART CATH AND CORONARY/GRAFT ANGIOGRAPHY: CATH118265

## 2020-10-01 LAB — POCT I-STAT EG7
Acid-Base Excess: 0 mmol/L (ref 0.0–2.0)
Acid-base deficit: 3 mmol/L — ABNORMAL HIGH (ref 0.0–2.0)
Bicarbonate: 23 mmol/L (ref 20.0–28.0)
Bicarbonate: 26.9 mmol/L (ref 20.0–28.0)
Calcium, Ion: 0.82 mmol/L — CL (ref 1.15–1.40)
Calcium, Ion: 1.2 mmol/L (ref 1.15–1.40)
HCT: 34 % — ABNORMAL LOW (ref 39.0–52.0)
HCT: 41 % (ref 39.0–52.0)
Hemoglobin: 11.6 g/dL — ABNORMAL LOW (ref 13.0–17.0)
Hemoglobin: 13.9 g/dL (ref 13.0–17.0)
O2 Saturation: 74 %
O2 Saturation: 74 %
Potassium: 3.2 mmol/L — ABNORMAL LOW (ref 3.5–5.1)
Potassium: 4.2 mmol/L (ref 3.5–5.1)
Sodium: 144 mmol/L (ref 135–145)
Sodium: 150 mmol/L — ABNORMAL HIGH (ref 135–145)
TCO2: 24 mmol/L (ref 22–32)
TCO2: 28 mmol/L (ref 22–32)
pCO2, Ven: 43.5 mmHg — ABNORMAL LOW (ref 44.0–60.0)
pCO2, Ven: 50.6 mmHg (ref 44.0–60.0)
pH, Ven: 7.331 (ref 7.250–7.430)
pH, Ven: 7.334 (ref 7.250–7.430)
pO2, Ven: 42 mmHg (ref 32.0–45.0)
pO2, Ven: 42 mmHg (ref 32.0–45.0)

## 2020-10-01 SURGERY — RIGHT HEART CATH AND CORONARY/GRAFT ANGIOGRAPHY
Anesthesia: LOCAL

## 2020-10-01 MED ORDER — IOHEXOL 350 MG/ML SOLN
INTRAVENOUS | Status: DC | PRN
  Administered 2020-10-01: 80 mL

## 2020-10-01 MED ORDER — HEPARIN SODIUM (PORCINE) 1000 UNIT/ML IJ SOLN
INTRAMUSCULAR | Status: AC
  Filled 2020-10-01: qty 1

## 2020-10-01 MED ORDER — ONDANSETRON HCL 4 MG/2ML IJ SOLN: 4.0000 mg | Freq: Four times a day (QID) | INTRAMUSCULAR | Status: DC | PRN

## 2020-10-01 MED ORDER — SODIUM CHLORIDE 0.9% FLUSH: 3.0000 mL | INTRAVENOUS | Status: DC | PRN

## 2020-10-01 MED ORDER — MIDAZOLAM HCL 2 MG/2ML IJ SOLN
INTRAMUSCULAR | Status: AC
  Filled 2020-10-01: qty 2

## 2020-10-01 MED ORDER — ACETAMINOPHEN 325 MG PO TABS: 650.0000 mg | ORAL_TABLET | ORAL | Status: DC | PRN

## 2020-10-01 MED ORDER — HEPARIN (PORCINE) IN NACL 1000-0.9 UT/500ML-% IV SOLN
INTRAVENOUS | Status: AC
  Filled 2020-10-01: qty 1000

## 2020-10-01 MED ORDER — SODIUM CHLORIDE 0.9 % WEIGHT BASED INFUSION: 1.0000 mL/kg/h | INTRAVENOUS | Status: DC

## 2020-10-01 MED ORDER — LIDOCAINE HCL (PF) 1 % IJ SOLN
INTRAMUSCULAR | Status: AC
  Filled 2020-10-01: qty 30

## 2020-10-01 MED ORDER — VERAPAMIL HCL 2.5 MG/ML IV SOLN
INTRAVENOUS | Status: DC | PRN
  Administered 2020-10-01 (×2): 10 mL via INTRA_ARTERIAL

## 2020-10-01 MED ORDER — MIDAZOLAM HCL 2 MG/2ML IJ SOLN
INTRAMUSCULAR | Status: DC | PRN
  Administered 2020-10-01: 1 mg via INTRAVENOUS
  Administered 2020-10-01: 2 mg via INTRAVENOUS

## 2020-10-01 MED ORDER — SODIUM CHLORIDE 0.9 % WEIGHT BASED INFUSION
3.0000 mL/kg/h | INTRAVENOUS | Status: AC
  Administered 2020-10-01: 3 mL/kg/h via INTRAVENOUS

## 2020-10-01 MED ORDER — SODIUM CHLORIDE 0.9 % IV SOLN: 250.0000 mL | INTRAVENOUS | Status: DC | PRN

## 2020-10-01 MED ORDER — LABETALOL HCL 5 MG/ML IV SOLN: 10.0000 mg | INTRAVENOUS | Status: DC | PRN

## 2020-10-01 MED ORDER — ASPIRIN 81 MG PO CHEW: 81.0000 mg | CHEWABLE_TABLET | ORAL | Status: DC

## 2020-10-01 MED ORDER — FENTANYL CITRATE (PF) 100 MCG/2ML IJ SOLN
INTRAMUSCULAR | Status: DC | PRN
  Administered 2020-10-01 (×2): 25 ug via INTRAVENOUS

## 2020-10-01 MED ORDER — HEPARIN (PORCINE) IN NACL 1000-0.9 UT/500ML-% IV SOLN
INTRAVENOUS | Status: AC
  Filled 2020-10-01: qty 500

## 2020-10-01 MED ORDER — HYDRALAZINE HCL 20 MG/ML IJ SOLN: 10.0000 mg | INTRAMUSCULAR | Status: DC | PRN

## 2020-10-01 MED ORDER — LIDOCAINE HCL (PF) 1 % IJ SOLN
INTRAMUSCULAR | Status: DC | PRN
  Administered 2020-10-01 (×2): 2 mL

## 2020-10-01 MED ORDER — SODIUM CHLORIDE 0.9% FLUSH: 3.0000 mL | Freq: Two times a day (BID) | INTRAVENOUS | Status: DC

## 2020-10-01 MED ORDER — HEPARIN (PORCINE) IN NACL 1000-0.9 UT/500ML-% IV SOLN
INTRAVENOUS | Status: DC | PRN
  Administered 2020-10-01 (×2): 500 mL

## 2020-10-01 MED ORDER — FENTANYL CITRATE (PF) 100 MCG/2ML IJ SOLN
INTRAMUSCULAR | Status: AC
  Filled 2020-10-01: qty 2

## 2020-10-01 MED ORDER — SODIUM CHLORIDE 0.9 % IV SOLN
250.0000 mL | INTRAVENOUS | Status: DC | PRN
Start: 2020-10-02 — End: 2020-10-02

## 2020-10-01 MED ORDER — HEPARIN SODIUM (PORCINE) 1000 UNIT/ML IJ SOLN
INTRAMUSCULAR | Status: DC | PRN
  Administered 2020-10-01: 6000 [IU] via INTRAVENOUS

## 2020-10-01 MED ORDER — VERAPAMIL HCL 2.5 MG/ML IV SOLN
INTRAVENOUS | Status: AC
  Filled 2020-10-01: qty 2

## 2020-10-01 SURGICAL SUPPLY — 21 items
BAG SNAP BAND KOVER 36X36 (MISCELLANEOUS) ×1 IMPLANT
CATH BALLN WEDGE 5F 110CM (CATHETERS) ×1 IMPLANT
CATH INFINITI 5 FR AL2 (CATHETERS) ×1 IMPLANT
CATH INFINITI 5 FR IM (CATHETERS) ×1 IMPLANT
CATH INFINITI 5 FR RCB (CATHETERS) ×1 IMPLANT
CATH INFINITI 5FR AL1 (CATHETERS) ×1 IMPLANT
CATH INFINITI 5FR MPB2 (CATHETERS) ×1 IMPLANT
CATH INFINITI 5FR MULTPACK ANG (CATHETERS) ×1 IMPLANT
CATH SUPER TORQUE PLUS 6F MPA1 (CATHETERS) ×1 IMPLANT
COVER DOME SNAP 22 D (MISCELLANEOUS) ×1 IMPLANT
DEVICE RAD COMP TR BAND LRG (VASCULAR PRODUCTS) ×1 IMPLANT
GLIDESHEATH SLEND SS 6F .021 (SHEATH) ×1 IMPLANT
GUIDEWIRE INQWIRE 1.5J.035X260 (WIRE) IMPLANT
INQWIRE 1.5J .035X260CM (WIRE) ×2
KIT HEART LEFT (KITS) ×2 IMPLANT
PACK CARDIAC CATHETERIZATION (CUSTOM PROCEDURE TRAY) ×2 IMPLANT
SHEATH 6FR 75 DEST SLENDER (SHEATH) ×1 IMPLANT
SHEATH GLIDE SLENDER 4/5FR (SHEATH) ×1 IMPLANT
SHEATH PROBE COVER 6X72 (BAG) ×1 IMPLANT
TRANSDUCER W/STOPCOCK (MISCELLANEOUS) ×2 IMPLANT
TUBING CIL FLEX 10 FLL-RA (TUBING) ×2 IMPLANT

## 2020-10-01 NOTE — Discharge Instructions (Signed)
Radial Site Care  This sheet gives you information about how to care for yourself after your procedure. Your health care provider may also give you more specific instructions. If you have problems or questions, contact your health care provider. What can I expect after the procedure? After the procedure, it is common to have:  Bruising and tenderness at the catheter insertion area. Follow these instructions at home: Medicines  Take over-the-counter and prescription medicines only as told by your health care provider. Insertion site care 1. Follow instructions from your health care provider about how to take care of your insertion site. Make sure you: ? Wash your hands with soap and water before you remove your bandage (dressing). If soap and water are not available, use hand sanitizer. ? May remove dressing in 24 hours. 2. Check your insertion site every day for signs of infection. Check for: ? Redness, swelling, or pain. ? Fluid or blood. ? Pus or a bad smell. ? Warmth. 3. Do no take baths, swim, or use a hot tub for 5 days. 4. You may shower 24-48 hours after the procedure. ? Remove the dressing and gently wash the site with plain soap and water. ? Pat the area dry with a clean towel. ? Do not rub the site. That could cause bleeding. 5. Do not apply powder or lotion to the site. Activity  1. For 24 hours after the procedure, or as directed by your health care provider: ? Do not flex or bend the affected arm. ? Do not push or pull heavy objects with the affected arm. ? Do not drive yourself home from the hospital or clinic. You may drive 24 hours after the procedure. ? Do not operate machinery or power tools. ? KEEP ARM ELEVATED THE REMAINDER OF THE DAY. 2. Do not push, pull or lift anything that is heavier than 10 lb for 5 days. 3. Ask your health care provider when it is okay to: ? Return to work or school. ? Resume usual physical activities or sports. ? Resume sexual  activity. General instructions  If the catheter site starts to bleed, raise your arm and put firm pressure on the site. If the bleeding does not stop, get help right away. This is a medical emergency.  DRINK PLENTY OF FLUIDS FOR THE NEXT 2-3 DAYS.  No alcohol consumption for 24 hours after receiving sedation.  If you went home on the same day as your procedure, a responsible adult should be with you for the first 24 hours after you arrive home.  Keep all follow-up visits as told by your health care provider. This is important. Contact a health care provider if:  You have a fever.  You have redness, swelling, or yellow drainage around your insertion site. Get help right away if:  You have unusual pain at the radial site.  The catheter insertion area swells very fast.  The insertion area is bleeding, and the bleeding does not stop when you hold steady pressure on the area.  Your arm or hand becomes pale, cool, tingly, or numb. These symptoms may represent a serious problem that is an emergency. Do not wait to see if the symptoms will go away. Get medical help right away. Call your local emergency services (911 in the [place] Do not drive yourself to the hospital. Summary  After the procedure, it is common to have bruising and tenderness at the site.  Follow instructions from your health care provider about how to take care   of your radial site wound. Check the wound every day for signs of infection.  This information is not intended to replace advice given to you by your health care provider. Make sure you discuss any questions you have with your health care provider. Document Revised: 08/24/2017 Document Reviewed: 08/24/2017 Elsevier Patient Education  2020 Elsevier Inc. 

## 2020-10-01 NOTE — Interval H&P Note (Signed)
History and Physical Interval Note:  10/01/2020 1:46 PM  [name].  has presented today for surgery, with the diagnosis of aortic stenosis.  The various methods of treatment have been discussed with the patient and family. After consideration of risks, benefits and other options for treatment, the patient has consented to  Procedure(s): RIGHT/LEFT HEART CATH AND CORONARY/GRAFT ANGIOGRAPHY (N/A) as a surgical intervention.  The patient's history has been reviewed, patient examined, no change in status, stable for surgery.  I have reviewed the patient's chart and labs.  Questions were answered to the patient's satisfaction.     Sherren Mocha

## 2020-10-01 NOTE — Progress Notes (Signed)
Pt ambulated without difficulty or bleeding.   Discharged home with Sister  who will drive and stay with pt x 24 hrs. (L) arm on arm board

## 2020-10-02 ENCOUNTER — Encounter (HOSPITAL_COMMUNITY): Payer: Self-pay | Admitting: Cardiovascular Disease

## 2020-10-02 MED FILL — Heparin Sod (Porcine)-NaCl IV Soln 1000 Unit/500ML-0.9%: INTRAVENOUS | Qty: 500 | Status: AC

## 2020-10-03 ENCOUNTER — Other Ambulatory Visit: Payer: Self-pay

## 2020-10-03 DIAGNOSIS — I350 Nonrheumatic aortic (valve) stenosis: Secondary | ICD-10-CM

## 2020-10-03 DIAGNOSIS — N289 Disorder of kidney and ureter, unspecified: Secondary | ICD-10-CM

## 2020-10-06 ENCOUNTER — Other Ambulatory Visit: Payer: Self-pay

## 2020-10-06 DIAGNOSIS — N289 Disorder of kidney and ureter, unspecified: Secondary | ICD-10-CM

## 2020-10-06 DIAGNOSIS — I350 Nonrheumatic aortic (valve) stenosis: Secondary | ICD-10-CM

## 2020-10-10 ENCOUNTER — Encounter: Payer: Self-pay | Admitting: Physician Assistant

## 2020-10-10 ENCOUNTER — Ambulatory Visit (HOSPITAL_COMMUNITY): Payer: Medicare Other

## 2020-10-10 ENCOUNTER — Other Ambulatory Visit: Payer: Self-pay

## 2020-10-10 ENCOUNTER — Ambulatory Visit (HOSPITAL_COMMUNITY)
Admission: RE | Admit: 2020-10-10 | Discharge: 2020-10-10 | Disposition: A | Discharge status: Home / Self Care | Payer: Medicare Other | Source: Ambulatory Visit | Attending: Cardiovascular Disease | Admitting: Cardiovascular Disease

## 2020-10-10 ENCOUNTER — Ambulatory Visit (HOSPITAL_COMMUNITY)
Admission: RE | Admit: 2020-10-10 | Discharge: 2020-10-10 | Disposition: A | Discharge status: Home / Self Care | Payer: Medicare Other | Source: Ambulatory Visit | Attending: Family Medicine | Admitting: Family Medicine

## 2020-10-10 DIAGNOSIS — I350 Nonrheumatic aortic (valve) stenosis: Secondary | ICD-10-CM

## 2020-10-10 DIAGNOSIS — N289 Disorder of kidney and ureter, unspecified: Secondary | ICD-10-CM | POA: Insufficient documentation

## 2020-10-10 LAB — BASIC METABOLIC PANEL
Anion gap: 8 (ref 5–15)
BUN: 28 mg/dL — ABNORMAL HIGH (ref 8–23)
CO2: 31 mmol/L (ref 22–32)
Calcium: 9.5 mg/dL (ref 8.9–10.3)
Chloride: 98 mmol/L (ref 98–111)
Creatinine, Ser: 1.93 mg/dL — ABNORMAL HIGH (ref 0.61–1.24)
GFR, Estimated: 34 mL/min — ABNORMAL LOW (ref >60)
Glucose, Bld: 85 mg/dL (ref 70–99)
Potassium: 4.4 mmol/L (ref 3.5–5.1)
Sodium: 137 mmol/L (ref 135–145)

## 2020-10-10 MED ORDER — SODIUM CHLORIDE 0.9 % WEIGHT BASED INFUSION: 1.0000 mL/kg/h | INTRAVENOUS | Status: DC

## 2020-10-10 MED ORDER — SODIUM CHLORIDE 0.9 % WEIGHT BASED INFUSION
3.0000 mL/kg/h | INTRAVENOUS | Status: AC
  Administered 2020-10-10: 3 mL/kg/h via INTRAVENOUS

## 2020-10-10 MED ORDER — IOHEXOL 350 MG/ML SOLN
100.0000 mL | Freq: Once | INTRAVENOUS | Status: AC | PRN
  Administered 2020-10-10: 100 mL via INTRAVENOUS

## 2020-10-10 NOTE — Progress Notes (Signed)
Carotid duplex has been completed.   Preliminary results in CV Proc.   Abram Sander 10/10/2020 12:33 PM

## 2020-10-10 NOTE — Progress Notes (Signed)
Notified [name] in radiology pt IVF started at 0815, pt had breakfast at 0545.  She reports she will notify [place].  Will continue to monitor

## 2020-10-13 ENCOUNTER — Encounter: Payer: Self-pay | Admitting: Thoracic Surgery (Cardiothoracic Vascular Surgery)

## 2020-10-13 ENCOUNTER — Institutional Professional Consult (permissible substitution): Payer: Medicare Other | Admitting: Thoracic Surgery (Cardiothoracic Vascular Surgery)

## 2020-10-13 ENCOUNTER — Other Ambulatory Visit: Payer: Self-pay

## 2020-10-13 VITALS — BP 150/81 | HR 79 | Temp 98.1°F | Resp 20 | Ht 75.0 in | Wt 255.8 lb

## 2020-10-13 DIAGNOSIS — I350 Nonrheumatic aortic (valve) stenosis: Secondary | ICD-10-CM

## 2020-10-13 DIAGNOSIS — I359 Nonrheumatic aortic valve disorder, unspecified: Secondary | ICD-10-CM

## 2020-10-13 NOTE — Patient Instructions (Signed)
   Continue taking all current medications without change through the day before surgery.  Make sure to bring all of your medications with you when you come for your Pre-Admission Testing appointment at [facility]-Stay [facility].  Have nothing to eat or drink after midnight the night before surgery.  On the morning of surgery do not take any medications  At your appointment for Pre-Admission Testing at the [facility]Stay [facility] you will be asked to sign permission forms for your upcoming surgery.  By definition your signature on these forms implies that you and/or your designee provide full informed consent for your planned surgical procedure(s), that alternative treatment options have been discussed, that you understand and accept any and all potential risks, and that you have some understanding of what to expect for your post-operative convalescence.  For any major cardiac surgical procedure potential operative risks include but are not limited to at least some risk of death, stroke or other neurologic complication, myocardial infarction, congestive heart failure, respiratory failure, renal failure, bleeding requiring blood transfusion and/or reexploration, irregular heart rhythm, heart block or bradycardia requiring permanent pacemaker, pneumonia, pericardial effusion, pleural effusion, wound infection, pulmonary embolus or other thromboembolic complication, chronic pain, or other complications related to the specific procedure(s) performed.  For transcatheter aortic valve replacement additional risks include but are not limited to risk of paravalvular leak, valve embolization, valve thrombosis, aortic dissection, aortic rupture, ventricular septal defect or perforation, pericardial tamponade, injury of the abdominal aorta or its branches, and/or injury or occlusion of the arteries going to your arms or legs.  Please call to schedule a follow-up  appointment in our office prior to surgery if you have any unresolved questions about your planned surgical procedure, the associated risks, alternative treatment options, and/or expectations for your post-operative recovery.      

## 2020-10-13 NOTE — Progress Notes (Addendum)
[facility]  MULTIDISCIPLINARY HEART VALVE CLINIC  CARDIOTHORACIC SURGERY CONSULTATION REPORT  Referring Provider is Crenshaw, Denice Bors, MD PCP is Jacklynn Ganong, MD  Chief Complaint  Patient presents with  . Aortic Stenosis    New patient consultation, review all studies     HPI:  Patient is an 87 year old moderately obese male with history of coronary artery disease status post coronary artery bypass grafting in 2007, aortic stenosis, hypertension, hyperlipidemia, paroxysmal atrial fibrillation not currently on anticoagulation, mini stroke, prostate cancer, and mild fusiform aneurysmal enlargement of the ascending thoracic aorta who has been referred for surgical consultation to discuss treatment options for management of severe symptomatic aortic stenosis.  Patient's cardiac history dates back to 2007 when he presented with symptomatic ischemic heart disease and underwent coronary artery bypass grafting x5.  Grafts placed at the time of surgery included left internal mammary artery to the distal left anterior descending coronary artery, sequential saphenous vein graft to the first diagonal and left circumflex, and sequential saphenous vein graft to the posterior descending and right posterior lateral branch.  He recovered uneventfully and has been followed intermittently ever since by Dr. Stanford Breed.  Previous Holter monitor showed very brief episode of atrial fibrillation.  Carotid duplex scans revealed no significant cerebrovascular disease.  Nuclear stress test performed December 2020 revealed normal left ventricular function with no evidence for ischemia.  Echocardiograms have documented the presence of normal left ventricular function with aortic stenosis that has slowly progressed in severity.  Over the past 6 months or more the patient has developed progressive symptoms of exertional shortness of breath without any chest discomfort.  Symptoms now occur with moderate level  activity and are always relieved by rest.  He denies any resting shortness of breath, PND, orthopnea, or lower extremity edema.  He has not had any chest pain or chest tightness either with activity or at rest.  Recent follow-up echocardiogram performed September 23, 2020 revealed normal left ventricular systolic function with severe aortic stenosis.  Peak velocity across aortic valve measured as high as 4.2 m/s corresponding to mean transvalvular gradient estimated 36 mmHg and aortic valve area calculated 0.82 cm by VTI.  The DVI was notably 0.26.  Left ventricular systolic function appeared normal with ejection fraction estimated 60 to 65%.  The patient was referred to the multidisciplinary heart valve clinic and underwent diagnostic cardiac catheterization by Dr. Burt Knack on October 01, 2020.  Catheterization revealed severe native coronary artery disease but continued patency of all 5 bypass grafts placed at the time of the patient's previous coronary artery bypass surgery.  Right heart pressures were normal.  CT angiography was performed and the patient was referred for surgical consultation.  Patient is divorced and [place] near [place].  He is accompanied by his daughter for his office consultation visit today.  She lives nearby and is very supportive.  Despite his advanced age the patient continues to work on his property on a daily basis and is remarkably active physically.  He has had problems with chronic back pain which got considerably worse in December 2020 at which time he underwent back surgery.  He states that he is never completely recovered from this and he still suffers from chronic pain but at this point his primary limitation is that of exertional shortness of breath.  Past Medical History:  Diagnosis Date  . Arthritis    OA- lumbar  . Atrial fibrillation ([place])   . BPH (benign  prostatic hypertrophy)   . BRADYCARDIA   . CAD    s/p CABG in 2007 x 5  . Carotid  stenosis    0-39% bilaterally  . Complication of anesthesia    ? procaine allergy, pt. remarks that he had passing out surrounding Prostate biopsy   . History of kidney stones 2010   at [facility] treated with cystoscopy  . HYPERCHOLESTEROLEMIA    statin intolerant  . HYPERTENSION   . Long term (current) use of anticoagulants   . NEPHROLITHIASIS   . PAF (paroxysmal atrial fibrillation) (HCC)    briefly noted on Holter monitor  . Prostate cancer ([place]) 2014   seed implant  . Severe aortic stenosis   . Skin cancer    sqaumous- top of head  . Stroke [facility])    mini stroke   . Thoracic ascending aortic aneurysm (H[facility])    4.4 cm 08/21/18    Past Surgical History:  Procedure Laterality Date  . ANTERIOR LAT LUMBAR FUSION Left 07/23/2019   Procedure: Lumbar Two-Three, Lumbar Three-Four, Lumbar Four-Five XLIF, pedicle screw fixation, mazor;  Surgeon: Kristeen Miss, MD;  Location: [facility];  Service: Neurosurgery;  Laterality: Left;  Lumbar Two-Three, Lumbar Three-Four, Lumbar Four-Five XLIF, pedicle screw fixation, mazor  . APPENDECTOMY  1999  . APPLICATION OF ROBOTIC ASSISTANCE FOR SPINAL PROCEDURE N/A 07/23/2019   Procedure: APPLICATION OF ROBOTIC ASSISTANCE FOR SPINAL PROCEDURE;  Surgeon: Kristeen Miss, MD;  Location: [facility];  Service: Neurosurgery;  Laterality: N/A;  . cardiac arrest following CABG N/A   . COLONOSCOPY    . CORONARY ARTERY BYPASS GRAFT  2007   x 5  . EYE SURGERY     cataracts removed - done at [facility], IOL in both eyes    . HERNIA REPAIR Left 1996   inguinal  . JOINT REPLACEMENT     L hip & L shoulder   . LITHOTRIPSY  2013  . LUMBAR PERCUTANEOUS PEDICLE SCREW 3 LEVEL N/A 07/23/2019   Procedure: LUMBAR PERCUTANEOUS PEDICLE SCREW 3 LEVEL;  Surgeon: Kristeen Miss, MD;  Location: [facility];  Service: Neurosurgery;  Laterality: N/A;  . RADIOACTIVE SEED IMPLANT N/A 01/06/2015   Procedure: RADIOACTIVE SEED IMPLANT/BRACHYTHERAPY IMPLANT;  Surgeon: Hollice Espy, MD;  Location: [facility];   Service: Urology;  Laterality: N/A;  . RIGHT HEART CATH AND CORONARY/GRAFT ANGIOGRAPHY N/A 10/01/2020   Procedure: RIGHT HEART CATH AND CORONARY/GRAFT ANGIOGRAPHY;  Surgeon: Sherren Mocha, MD;  Location: [facility];  Service: Cardiovascular;  Laterality: N/A;  . TOTAL HIP ARTHROPLASTY Left     Family History  Problem Relation Age of Onset  . Heart disease Mother   . Heart failure Mother   . Heart attack Father     Social History   Socioeconomic History  . Marital status: Divorced    Spouse name: Not on file  . Number of children: Not on file  . Years of education: Not on file  . Highest education level: Not on file  Occupational History  . Not on file  Tobacco Use  . Smoking status: Former Smoker    Packs/day: 1.00    Types: Cigarettes    Quit date: 11/30/1968    Years since quitting: 51.9  . Smokeless tobacco: Former Systems developer    Types: [place] date: 12/26/1987  Vaping Use  . Vaping Use: Never used  Substance and Sexual Activity  . Alcohol use: No    Alcohol/week: 0.0 standard drinks  . Drug use: No  .  Sexual activity: Not Currently  Other Topics Concern  . Not on file  Social History Narrative  . Not on file   Social Determinants of Health   Financial Resource Strain: Not on file  Food Insecurity: Not on file  Transportation Needs: Not on file  Physical Activity: Not on file  Stress: Not on file  Social Connections: Not on file  Intimate Partner Violence: Not on file    Current Outpatient Medications  Medication Sig Dispense Refill  . aspirin EC 81 MG tablet Take 81 mg by mouth daily. Swallow whole.    . Cholecalciferol (VITAMIN D-3) 125 MCG (5000 UT) TABS Take 5,000 Units by mouth daily.    . hydrochlorothiazide (HYDRODIURIL) 25 MG tablet Take 25 mg by mouth daily.     Marland Kitchen ibuprofen (ADVIL) 200 MG tablet Take 200 mg by mouth every 6 (six) hours as needed.    . tamsulosin (FLOMAX) 0.4 MG CAPS capsule Take 0.8 mg by mouth at bedtime.      Current  Facility-Administered Medications  Medication Dose Route Frequency Provider Last Rate Last Admin  . sodium chloride flush (NS) 0.9 % injection 3 mL  3 mL Intravenous Q12H Crenshaw, Denice Bors, MD        Allergies  Allergen Reactions  . Metronidazole Hives and Itching  . Procaine Other (See Comments)    Loss of consciousness Remarks that the caine injection for dental procedure - but reports that the anxiety might have made him pass out   . Statins Other (See Comments)    Leg pain      Review of Systems:   General:  normal appetite, decreased energy, no weight gain, no weight loss, no fever  Cardiac:  no chest pain with exertion, no chest pain at rest, +SOB with exertion, no resting SOB, no PND, no orthopnea, no palpitations, no arrhythmia, no atrial fibrillation, no LE edema, no dizzy spells, no syncope  Respiratory:  exertional shortness of breath, no home oxygen, no productive cough, no dry cough, no bronchitis, no wheezing, no hemoptysis, no asthma, no pain with inspiration or cough, no sleep apnea, no CPAP at night  GI:   no difficulty swallowing, no reflux, no frequent heartburn, no hiatal hernia, no abdominal pain, + constipation, no diarrhea, no hematochezia, no hematemesis, no melena  GU:   no dysuria,  no frequency, no urinary tract infection, no hematuria, + enlarged prostate, no kidney stones, no kidney disease  Vascular:  no pain suggestive of claudication, no pain in feet, no leg cramps, no varicose veins, no DVT, no non-healing foot ulcer  Neuro:   no stroke, + TIA's, no seizures, no headaches, no temporary blindness one eye,  no slurred speech, no peripheral neuropathy, no chronic pain, no instability of gait, no memory/cognitive dysfunction  Musculoskeletal: no arthritis, no joint swelling, no myalgias, no difficulty walking, normal mobility   Skin:   no rash, no itching, no skin infections, no pressure sores or ulcerations  Psych:   no anxiety, no depression, no nervousness,  no unusual recent stress  Eyes:   no blurry vision, no floaters, no recent vision changes, does not wear glasses or contacts  ENT:   + hearing loss, no loose or painful teeth, full dentures  Hematologic:  no easy bruising, no abnormal bleeding, no clotting disorder, no frequent epistaxis  Endocrine:  no diabetes, does not check CBG's at home           Physical Exam:   BP (!) 150/81 (  BP Location: Right Arm, Patient Position: Sitting, Cuff Size: Normal)   Pulse 79   Temp 98.1 F (36.7 C) (Skin)   Resp 20   Ht 6\' 3"  (1.905 m)   Wt 255 lb 12.8 oz (116 kg)   SpO2 95% Comment: RA  BMI 31.97 kg/m   General:  Elderly but  well-appearing  HEENT:  Unremarkable   Neck:   no JVD, no bruits, no adenopathy   Chest:   clear to auscultation, symmetrical breath sounds, no wheezes, no rhonchi   CV:   RRR, grade III/VI crescendo/decrescendo murmur heard best at RSB,  no diastolic murmur  Abdomen:  soft, non-tender, no masses   Extremities:  warm, well-perfused, pulses palpable, no LE edema  Rectal/GU  Deferred  Neuro:   Grossly non-focal and symmetrical throughout  Skin:   Clean and dry, no rashes, no breakdown   Diagnostic Tests:  ECHOCARDIOGRAM REPORT       Patient Name:  Rayshawn Maney. Date of Exam: 09/23/2020  Medical Rec #: [id number]      Height:    75.0 in  Accession #:  [id number]      Weight:    257.3 lb  Date of Birth: Feb 27, 1934      BSA:     2.442 m  Patient Age:  58 years       BP:      125/82 mmHg  Patient Gender: M          HR:      70 bpm.  Exam Location: [facility]   Procedure: 2D Echo   Indications:  Aortic valve stenosis, etiology of cardiac valve disease         unspecified [I35.0 (ICD-10-CM)]    History:    Patient has prior history of Echocardiogram examinations,  most         recent 07/06/2019. CAD, Carotid Disease and TIA,         Arrythmias:Atrial  Fibrillation;         Signs/Symptoms:Dizziness/Lightheadedness and Shortness of         Breath.    Sonographer:  Luane School  Referring Phys: [place]    1. Sigmoid septum. Left ventricular ejection fraction, by estimation, is  60 to 65%. The left ventricle has normal function. The left ventricle has  no regional wall motion abnormalities. There is moderate left ventricular  hypertrophy. Left ventricular  diastolic parameters are consistent with Grade I diastolic dysfunction  (impaired relaxation).  2. Right ventricular systolic function is normal. The right ventricular  size is normal.  3. Left atrial size was mildly dilated.  4. The mitral valve is normal in structure. No evidence of mitral valve  regurgitation. No evidence of mitral stenosis.  5. The aortic valve is normal in structure. There is moderate  calcification of the aortic valve. There is moderate thickening of the  aortic valve. Aortic valve regurgitation is mild. Severe aortic valve  stenosis. Aortic valve area, by VTI measures 0.82  cm. Aortic valve mean gradient measures 36.0 mmHg.  6. Aneurysm of the ascending aorta, measuring 45 mm.  7. The inferior vena cava is normal in size with greater than 50%  respiratory variability, suggesting right atrial pressure of 3 mmHg.   FINDINGS  Left Ventricle: Sigmoid septum. Left ventricular ejection fraction, by  estimation, is 60 to 65%. The left ventricle has normal function. The left  ventricle has no regional wall motion abnormalities. The left ventricular  internal cavity size was normal  in size. There is moderate left ventricular hypertrophy. Left ventricular  diastolic parameters are consistent with Grade I diastolic dysfunction  (impaired relaxation).   Right Ventricle: The right ventricular size is normal. No increase in  right ventricular wall thickness. Right ventricular systolic function is  normal.    Left Atrium: Left atrial size was mildly dilated.   Right Atrium: Right atrial size was normal in size.   Pericardium: There is no evidence of pericardial effusion.   Mitral Valve: The mitral valve is normal in structure. No evidence of  mitral valve regurgitation. No evidence of mitral valve stenosis.   Tricuspid Valve: The tricuspid valve is normal in structure. Tricuspid  valve regurgitation is not demonstrated. No evidence of tricuspid  stenosis.   Aortic Valve: The aortic valve is normal in structure. There is moderate  calcification of the aortic valve. There is moderate thickening of the  aortic valve. Aortic valve regurgitation is mild. Aortic regurgitation PHT  measures 603 msec. Severe aortic  stenosis is present. Aortic valve mean gradient measures 36.0 mmHg. Aortic  valve peak gradient measures 70.2 mmHg. Aortic valve area, by VTI measures  0.82 cm.   Pulmonic Valve: The pulmonic valve was normal in structure. Pulmonic valve  regurgitation is not visualized. No evidence of pulmonic stenosis.   Aorta: The aortic root is normal in size and structure. There is an  aneurysm involving the ascending aorta measuring 45 mm.   Venous: The inferior vena cava is normal in size with greater than 50%  respiratory variability, suggesting right atrial pressure of 3 mmHg.   IAS/Shunts: No atrial level shunt detected by color flow Doppler.     LEFT VENTRICLE  PLAX 2D  LVIDd:     5.40 cm Diastology  LVIDs:     3.40 cm LV e' medial:  5.00 cm/s  LV PW:     1.70 cm LV E/e' medial: 11.3  LV IVS:    1.70 cm LV e' lateral:  6.31 cm/s  LVOT diam:   2.00 cm LV E/e' lateral: 8.9  LV SV:     78  LV SV Index:  32  LVOT Area:   3.14 cm     RIGHT VENTRICLE      IVC  RV S prime:   5.22 cm/s IVC diam: 1.70 cm  TAPSE (M-mode): 1.3 cm   LEFT ATRIUM       Index    RIGHT ATRIUM      Index  LA diam:    5.80 cm 2.37 cm/m RA  Area:   19.40 cm  LA Vol (A2C):  77.1 ml 31.57 ml/m RA Volume:  47.20 ml 19.33 ml/m  LA Vol (A4C):  79.6 ml 32.59 ml/m  LA Biplane Vol: 81.2 ml 33.25 ml/m  AORTIC VALVE  AV Area (Vmax):  0.73 cm  AV Area (Vmean):  0.83 cm  AV Area (VTI):   0.82 cm  AV Vmax:      419.00 cm/s  AV Vmean:     278.000 cm/s  AV VTI:      0.954 m  AV Peak Grad:   70.2 mmHg  AV Mean Grad:   36.0 mmHg  LVOT Vmax:     97.50 cm/s  LVOT Vmean:    73.400 cm/s  LVOT VTI:     0.249 m  LVOT/AV VTI ratio: 0.26  AI PHT:      603 msec    AORTA  Ao Root diam:  3.90 cm  Ao Sinus diam: 3.50 cm  Ao STJ diam:  3.8 cm  Ao Asc diam:  4.57 cm  Ao Desc diam: 2.70 cm   MITRAL VALVE        TRICUSPID VALVE  MV Area (PHT): 4.31 cm   TR Peak grad:  15.8 mmHg  MV Decel Time: 176 msec   TR Vmax:    199.00 cm/s  MV E velocity: 56.30 cm/s  MV A velocity: 101.00 cm/s SHUNTS  MV E/A ratio: 0.56     Systemic VTI: 0.25 m               Systemic Diam: 2.00 cm   Jenne Campus MD  Electronically signed by Jenne Campus MD  Signature Date/Time: 09/23/2020/12:20:23 PM    EKG: NSR w/ with baseline first degree AV block  (09/26/2020)      RIGHT HEART CATH AND CORONARY/GRAFT ANGIOGRAPHY    Conclusion    Ost RCA to Prox RCA lesion is 80% stenosed.  Mid RCA lesion is 90% stenosed.  Prox LAD lesion is 100% stenosed.  LIMA graft was visualized by angiography and is normal in caliber.  The graft exhibits no disease.  Seq SVG- PDA/PLA graft was visualized by angiography.  Origin to Prox Graft lesion before 1st RPL is 50% stenosed.  Mid LM to Dist LM lesion is 80% stenosed.  Ost Cx to Prox Cx lesion is 100% stenosed.  Seq SVG- Diagonal and OM1 and is normal in caliber.  The graft exhibits no disease.  There is severe aortic valve stenosis.   1.  Severe native vessel coronary artery disease with severe  stenosis of the RCA, left main, LAD, and occlusion of the circumflex 2.  Status post aortocoronary bypass surgery with continued patency of the LIMA to LAD, patency of the sequential saphenous vein graft to first diagonal and first OM, and patency of the sequential saphenous vein graft to the right PDA and PLA branches.  There appears to be moderate stenosis of the saphenous vein graft to right coronary artery with subselective visualization. 3.  Known severe aortic stenosis with heavy calcification of the aortic valve on plain fluoroscopy 4.  Normal right heart pressures  Recommend: Multidisciplinary evaluation for TAVR.  Okay to proceed with CT angiography studies using renal protocol followed by surgical consultation with Dr. Roxy Manns.   Indications  Severe aortic stenosis [I35.0 (ICD-10-CM)]   Procedural Details  Technical Details INDICATION: Severe Stage D1 aortic stenosis.  87 year old gentleman with severe aortic stenosis.  Patient is status post multivessel CABG by Dr. Roxy Manns in 2007, treated with 5 grafts.  He has developed progressive dyspnea and has been found to have progressive and now severe aortic stenosis, referred for right and left heart catheterization for further evaluation.  PROCEDURAL DETAILS: The right antecubital fossa has an indwelling IV.  I attempted to change this out for a 4/5 French slender sheath but this was unsuccessful.  Ultrasound guidance was used and I was able to access the basilic vein without difficulty under direct ultrasound visualization.  Images are captured and stored in the patient's chart.  A 5 French Swan-Ganz catheter was used for the right heart catheterization where pressures were recorded and oxygen saturations drawn throughout the right heart chambers per protocol.  The left wrist is prepped, draped, and anesthetized with 1% lidocaine. Using the modified Seldinger technique, a 5/6 French Slender sheath is introduced into the left radial artery. 3 mg of  verapamil is administered through the sheath,  weight-based unfractionated heparin was administered intravenously. Standard Judkins catheters are used for selective coronary angiography.  The case is technically challenging because of subclavian tortuosity and angulation from the subclavian artery into the ascending aorta.  It was very difficult to manipulate catheters and engage the coronary arteries and bypass grafts.  Ultimately, I changed out to a 75 cm Terumo sheath.  Catheter exchanges are performed over an exchange length guidewire. There are no immediate procedural complications. A TR band is used for radial hemostasis at the completion of the procedure.  The patient was transferred to the post catheterization recovery area for further monitoring.    Estimated blood loss <50 mL.   During this procedure medications were administered to achieve and maintain moderate conscious sedation while the patient's heart rate, blood pressure, and oxygen saturation were continuously monitored and I was present face-to-face 100% of this time.   Medications (Filter: Administrations occurring from 1325 to 1506 on 10/01/20) (important) Continuous medications are totaled by the amount administered until 10/01/20 1506.    fentaNYL (SUBLIMAZE) injection (mcg) Total dose:  50 mcg  Date/Time Rate/Dose/Volume Action   10/01/20 1347 25 mcg Given   1436 25 mcg Given    midazolam (VERSED) injection (mg) Total dose:  3 mg  Date/Time Rate/Dose/Volume Action   10/01/20 1347 2 mg Given   1436 1 mg Given    lidocaine (PF) (XYLOCAINE) 1 % injection (mL) Total volume:  4 mL  Date/Time Rate/Dose/Volume Action   10/01/20 1354 2 mL Given   1404 2 mL Given    Heparin (Porcine) in NaCl 1000-0.9 UT/500ML-% SOLN (mL) Total volume:  1,000 mL  Date/Time Rate/Dose/Volume Action   10/01/20 1404 500 mL Given   1404 500 mL Given    Radial Cocktail/Verapamil only (mL) Total volume:  20 mL  Date/Time  Rate/Dose/Volume Action   10/01/20 1406 10 mL Given   1434 10 mL Given    heparin sodium (porcine) injection (Units) Total dose:  6,000 Units  Date/Time Rate/Dose/Volume Action   10/01/20 1411 6,000 Units Given    iohexol (OMNIPAQUE) 350 MG/ML injection (mL) Total volume:  80 mL  Date/Time Rate/Dose/Volume Action   10/01/20 1501 80 mL Given    Sedation Time  Sedation Time Physician-1: 1 hour 12 minutes 52 seconds   Contrast  Medication Name Total Dose  iohexol (OMNIPAQUE) 350 MG/ML injection 80 mL    Radiation/Fluoro  Fluoro time: 23.6 (min) DAP: 37019 (mGycm2) Cumulative Air Kerma: 432 (mGy)   Coronary Findings   Diagnostic Dominance: Right  Left Main  Mid LM to Dist LM lesion is 80% stenosed. The lesion is calcified.  Left Anterior Descending  Prox LAD lesion is 100% stenosed. The lesion is calcified.  Left Circumflex  Ost Cx to Prox Cx lesion is 100% stenosed. The lesion is calcified.  Right Coronary Artery  Ost RCA to Prox RCA lesion is 80% stenosed. The lesion is calcified.  Mid RCA lesion is 90% stenosed. The lesion is severely calcified.  LIMA LIMA Graft To Mid LAD  LIMA graft was visualized by angiography and is normal in caliber. The graft exhibits no disease. LIMA-LAD patent  Sequential Jump Graft Graft To 1st RPL, RPDA  Seq SVG- PDA/PLA graft was visualized by angiography.  Origin to Prox Graft lesion before 1st RPL is 50% stenosed.  Sequential Jump Graft Graft To 1st Diag, 1st Mrg  Seq SVG- Diagonal and OM1 and is normal in caliber. The graft exhibits no disease.   Intervention  No interventions have been documented.  Left Heart  Aortic Valve There is severe aortic valve stenosis. The aortic valve is calcified. There is restricted aortic valve motion. Known severe aortic stenosis by noninvasive assessment   Coronary Diagrams   Diagnostic Dominance: Right    Intervention    Implants    No implant documentation for this case.     Syngo Images  Show images for CARDIAC CATHETERIZATION  Images on Long Term Storage  Show images for Jose, Corvin.  Link to Procedure Log  Procedure Log     Hemo Data  Flowsheet Row Most Recent Value  Fick Cardiac Output 5.99 L/min  Fick Cardiac Output Index 2.47 (L/min)/BSA  RA A Wave 9 mmHg  RA V Wave 8 mmHg  RA Mean 6 mmHg  RV Systolic Pressure 35 mmHg  RV Diastolic Pressure -1 mmHg  RV EDP 8 mmHg  PA Systolic Pressure 32 mmHg  PA Diastolic Pressure 13 mmHg  PA Mean 19 mmHg  PW A Wave 8 mmHg  PW V Wave 7 mmHg  PW Mean 5 mmHg  AO Systolic Pressure 540 mmHg  AO Diastolic Pressure 76 mmHg  AO Mean 105 mmHg  QP/QS 1  TPVR Index 7.7 HRUI  TSVR Index 42.55 HRUI  PVR SVR Ratio 0.14  TPVR/TSVR Ratio 0.18     Cardiac TAVR CT  TECHNIQUE: The patient was scanned on a Siemens Force 086 slice scanner. A 120 kV retrospective scan was triggered in the descending thoracic aorta at 111 HU's. Gantry rotation speed was 270 msecs and collimation was .9 mm. No beta blockade or nitro were given. The 3D data set was reconstructed in 5% intervals of the R-R cycle. Systolic and diastolic phases were analyzed on a dedicated work station using MPR, MIP and VRT modes. The patient received 100 cc of contrast.  FINDINGS: Aortic Valve: Moderately thickened tricuspid aortic valve with moderate calcification. The planimeter valve area is 3.18 Sq cm which is not consistent with severe aortic stenosis  Number of leaflets: 3  LVOT calcification: Minimal  Annular calcification: Mild  Aortic Valve Calcium Score 1400.  Aortic Annulus Measurements  Major annulus diameter: 33 mm  Minor annulus diameter: 22 mm  Annular perimeter: 89 mm  Annular area: 5.64 cm2  Aortic Root Measurements- Diastole  Sinus of Valsalva perimeter: 112 mm  Sinotubular Junction: 37 mm  Ascending Thoracic Aorta: Mild aneurysmal dilatation of the ascending thoracic aorta:  45 mm.  Aortic Arch: 31 mm; aortic atherosclerosis noted.  Descending Thoracic Aorta: 34 mm; aortic atherosclerosis noted.  Sinus of Valsalva Measurements:  Right coronary cusp width: 33 mm  Left-coronary cusp width: 35 mm  Non- coronary cusp width: 34 mm  Coronary Artery Height above Annulus:  Left Main: 15 mm  Right Coronary: 14 mm  Coronary Arteries: Calcified native coronary arteries with bypass grafting noted.  Coronary Artery Calcium score: Deferred in the setting of known CAD and prior bypass.  Optimum Fluoroscopic Angle for Delivery: 3 Cusp-traditional View: LAO 18, CAU 5. Anterior View: RAO 0, Cau 25.  Valves for heart team consideration: 29 mm SAPIEN 3; 31 mm CoreValve system.  IMPRESSION: 1. Aortic stenosis; CINE, calcium score, and planimetry less consistent with severe aortic stenosis. Findings pertinent to TAVR procedure are detailed above.  2. Coronary Arteries: Calcified native coronary arteries with bypass grafting noted.  3.  Moderate ascending aortic aneurysm noted: 45 mm.  4.  Aortic atherosclerosis noted.  Occupational hygienist  By: Rudean Haskell MD   On: 10/12/2020 17:28     CT ANGIOGRAPHY CHEST, ABDOMEN AND PELVIS  TECHNIQUE: Multidetector CT imaging through the chest, abdomen and pelvis was performed using the standard protocol during bolus administration of intravenous contrast. Multiplanar reconstructed images and MIPs were obtained and reviewed to evaluate the vascular anatomy.  CONTRAST:  173mL OMNIPAQUE IOHEXOL 350 MG/ML SOLN  COMPARISON:  Chest CTA 09/08/2020. CT the abdomen and pelvis 09/17/2014.  FINDINGS: CTA CHEST FINDINGS  Cardiovascular: Heart size is normal. There is no significant pericardial fluid, thickening or pericardial calcification. There is aortic atherosclerosis, as well as atherosclerosis of the great vessels of the mediastinum and the  coronary arteries, including calcified atherosclerotic plaque in the left main, left anterior descending, left circumflex and right coronary arteries. Status post median sternotomy for CABG including LIMA to the LAD. Severe thickening and calcification of the aortic valve. Mild aneurysmal dilatation of the ascending thoracic aorta (4.5 cm in diameter).  Mediastinum/Lymph Nodes: No pathologically enlarged mediastinal or hilar lymph nodes. Esophagus is unremarkable in appearance. No axillary lymphadenopathy.  Lungs/Pleura: No suspicious appearing pulmonary nodules or masses are noted. No acute consolidative airspace disease. No pleural effusions.  Musculoskeletal/Soft Tissues: Median sternotomy wires. There are no aggressive appearing lytic or blastic lesions noted in the visualized portions of the skeleton.  CTA ABDOMEN AND PELVIS FINDINGS  Hepatobiliary: No suspicious cystic or solid hepatic lesions. No intra or extrahepatic biliary ductal dilatation. Gallbladder is normal in appearance.  Pancreas: No pancreatic mass. No pancreatic ductal dilatation. No pancreatic or peripancreatic fluid collections or inflammatory changes.  Spleen: Unremarkable.  Adrenals/Urinary Tract: Multiple low-attenuation lesions in both kidneys compatible with simple cysts, largest of which is 6 cm in diameter extending exophytically from the lower pole of the left kidney. In the posterior aspect of the lower pole of the left kidney there is also a exophytic 4.2 cm intermediate attenuation lesion which is only slightly larger than remote prior study from 11/27/2014, most compatible with a proteinaceous/hemorrhagic cyst. In the lower pole collecting system of the right kidney there is a 6 mm nonobstructive calculus. No hydroureteronephrosis. Urinary bladder is normal in appearance. Bilateral adrenal glands are normal in appearance.  Stomach/Bowel: Normal appearance of the stomach. No  pathologic dilatation of small bowel or colon. Several colonic diverticulae are noted, without surrounding inflammatory changes to suggest an acute diverticulitis at this time. The appendix is not confidently identified and may be surgically absent. Regardless, there are no inflammatory changes noted adjacent to the cecum to suggest the presence of an acute appendicitis at this time.  Vascular/Lymphatic: Vascular findings and measurements pertinent to potential TAVR procedure, as detailed below. Aortic atherosclerosis, without evidence of aneurysm or dissection in the abdominal or pelvic vasculature. No lymphadenopathy noted in the abdomen or pelvis.  Reproductive: Brachytherapy implants throughout the prostate gland. Seminal vesicles are unremarkable in appearance.  Other: No significant volume of ascites.  No pneumoperitoneum.  Musculoskeletal: Status post PLIF from L2-L5 with interbody grafts at L2-L3, L3-L4 and L4-L5. Status post left hip arthroplasty. There are no aggressive appearing lytic or blastic lesions noted in the visualized portions of the skeleton.  VASCULAR MEASUREMENTS PERTINENT TO TAVR:  AORTA:  Minimal Aortic Diameter-17 x 16 mm  Severity of Aortic Calcification-severe  RIGHT PELVIS:  Right Common Iliac Artery -  Minimal Diameter-12.3 x 10.1 mm  Tortuosity-mild  Calcification-moderate to severe  Right External Iliac Artery -  Minimal Diameter-9.4 x 8.6 mm  Tortuosity-severe  Calcification-moderate  Right Common Femoral Artery -  Minimal Diameter-9.6 x 9.3 mm  Tortuosity-mild  Calcification-moderate to severe  LEFT PELVIS:  Left Common Iliac Artery -  Minimal Diameter-10.8 x 10.8 mm  Tortuosity-mild  Calcification-moderate to severe  Left External Iliac Artery -  Minimal Diameter-10.4 x 8.8 mm  Tortuosity-severe  Calcification-mild-to-moderate  Left Common Femoral Artery -  Minimal  Diameter-9.0 x 7.8 mm  Tortuosity-mild  Calcification-moderate to severe  Review of the MIP images confirms the above findings.  IMPRESSION: 1. Vascular findings and measurements pertinent to potential TAVR procedure, as detailed above. 2. Severe thickening calcification of the aortic valve, compatible with reported clinical history of severe aortic stenosis. 3. Aortic atherosclerosis, in addition to left main and 3 vessel coronary artery disease. Status post median sternotomy for CABG including LIMA to the LAD. 4. Mild aneurysmal dilatation of the ascending thoracic aorta (4.5 cm in diameter). Ascending thoracic aortic aneurysm. Recommend semi-annual imaging followup by CTA or MRA and referral to cardiothoracic surgery if not already obtained. This recommendation follows 2010 ACCF/AHA/AATS/ACR/ASA/SCA/SCAI/SIR/STS/SVM Guidelines for the Diagnosis and Management of Patients With Thoracic Aortic Disease. Circulation. 2010; 121: P824-M353. Aortic aneurysm NOS (ICD10-I71.9). 5. Additional incidental findings, as above.   Electronically Signed   By: Vinnie Langton M.D.   On: 10/11/2020 05:39    STS score Procedure: AVR + CAB  Risk of Mortality:  8.043%  Renal Failure:  9.828%  Permanent Stroke:  3.882%  Prolonged Ventilation:  23.380%  DSW Infection:  0.299%  Reoperation:  5.093%  Morbidity or Mortality:  37.504%  Short Length of Stay:  8.161%  Long Length of Stay:  22.185%   Impression:  Patient has stage D severe symptomatic aortic stenosis.  He describes progressive symptoms of exertional shortness of breath consistent with chronic diastolic congestive heart failure, New York Heart Association functional class IIb.  I personally reviewed the patient's recent transthoracic echocardiogram, EKG, diagnostic cardiac catheterization, and CT angiograms.  Echocardiogram reveals findings consistent with normal left ventricular systolic function and severe  aortic stenosis.  The aortic valve is trileaflet with moderate calcification and severely restricted leaflet mobility involving all 3 leaflets of the aortic valve.  Peak velocity across aortic valve measured as high as 4.2 m/s corresponding to mean transvalvular gradient estimated 36 mmHg and aortic valve area calculated only 0.82 cm by VTI.  Diagnostic cardiac catheterization revealed severe native coronary artery disease but continued patency with no significant obstruction of all 5 bypass grafts placed at the patient's previous coronary artery bypass surgery.  Cardiac-gated CTA of the heart reveals anatomical characteristics consistent with aortic stenosis suitable for treatment by transcatheter aortic valve replacement without any significant complicating features.  CTA of the aorta and iliac vessels demonstrate what appears to be adequate pelvic vascular access to facilitate a transfemoral approach, although the patient's iliac vessels have moderate atherosclerotic disease with significant tortuosity.   I agree the patient would likely benefit from aortic valve replacement.  Risks associated with conventional surgery would be elevated because of the patient's advanced age, history of previous stroke, and previous coronary artery bypass surgery.  Under the circumstances I favor transcatheter aortic valve replacement.   Plan:  The patient and his daughter were counseled at length regarding treatment alternatives for management of severe symptomatic aortic stenosis. Alternative approaches such as conventional aortic valve replacement, transcatheter aortic valve replacement, and continued medical therapy without intervention were compared and contrasted at length.  The risks associated with conventional surgical aortic valve replacement were discussed in detail,  as were expectations for post-operative convalescence, and why I would be reluctant to consider this patient a candidate for conventional surgery.   Issues specific to transcatheter aortic valve replacement were discussed including questions about long term valve durability, the potential for paravalvular leak, possible increased risk of need for permanent pacemaker placement, and other technical complications related to the procedure itself.  Long-term prognosis with medical therapy was discussed. This discussion was placed in the context of the patient's own specific clinical presentation and past medical history.  All of their questions have been addressed.  The patient desires to proceed with transcatheter aortic valve replacement in the near future.  We tentatively plan to proceed on October 28, 2020.  Following the decision to proceed with transcatheter aortic valve replacement, a discussion has been held regarding what types of management strategies would be attempted intraoperatively in the event of life-threatening complications, including whether or not the patient would be considered a candidate for the use of cardiopulmonary bypass and/or conversion to open sternotomy for attempted surgical intervention.  The patient specifically requests that should a potentially life-threatening complication develop we would not attempt emergency median sternotomy and/or other aggressive surgical procedures.  The patient has been advised of a variety of complications that might develop including but not limited to risks of death, stroke, paravalvular leak, aortic dissection or other major vascular complications, aortic annulus rupture, device embolization, cardiac rupture or perforation, mitral regurgitation, acute myocardial infarction, arrhythmia, heart block or bradycardia requiring permanent pacemaker placement, congestive heart failure, respiratory failure, renal failure, pneumonia, infection, other late complications related to structural valve deterioration or migration, or other complications that might ultimately cause a temporary or permanent loss of  functional independence or other long term morbidity.  The patient provides full informed consent for the procedure as described and all questions were answered.      I spent in excess of 90 minutes during the conduct of this office consultation and >50% of this time involved direct face-to-face encounter with the patient for counseling and/or coordination of their care.      Valentina Gu. Roxy Manns, MD 10/13/2020 2:51 PM

## 2020-10-17 ENCOUNTER — Other Ambulatory Visit: Payer: Self-pay

## 2020-10-17 DIAGNOSIS — I350 Nonrheumatic aortic (valve) stenosis: Secondary | ICD-10-CM

## 2020-10-23 ENCOUNTER — Other Ambulatory Visit: Payer: Self-pay | Admitting: Cardiology

## 2020-10-23 DIAGNOSIS — I10 Essential (primary) hypertension: Secondary | ICD-10-CM

## 2020-10-30 NOTE — Progress Notes (Addendum)
Surgical Instructions    Your procedure is scheduled on 11/04/20.  Report to [facility] Main Entrance "A" at 07:15 A.M., then check in with the Admitting office.  Call this number if you have problems the morning of surgery:  [phone]   If you have any questions prior to your surgery date call [phone]: Open Monday-Friday 8am-4pm    Remember:  Do not eat or drink after midnight the night before your surgery    Continue taking all medications without change through the day before surgery.  On the morning of surgery do not take any medications.                      Do not wear jewelry, make up, or nail polish            Do not wear lotions, powders, perfumes/colognes, or deodorant.            Men may shave face and neck.            Do not bring valuables to the hospital.            [facility] is not responsible for any belongings or valuables.  Do NOT Smoke (Tobacco/Vaping) or drink Alcohol 24 hours prior to your procedure If you use a CPAP at night, you may bring all equipment for your overnight stay.   Contacts, glasses, dentures or bridgework may not be worn into surgery, please bring cases for these belongings   For patients admitted to the hospital, discharge time will be determined by your treatment team.   Patients discharged the day of surgery will not be allowed to drive home, and someone needs to stay with them for 24 hours.    Special instructions:   [facility]- Preparing For Surgery  Before surgery, you can play an important role. Because skin is not sterile, your skin needs to be as free of germs as possible. You can reduce the number of germs on your skin by washing with CHG (chlorahexidine gluconate) Soap before surgery.  CHG is an antiseptic cleaner which kills germs and bonds with the skin to continue killing germs even after washing.    Oral Hygiene is also important to reduce your risk of infection.  Remember - BRUSH YOUR TEETH THE MORNING OF SURGERY  WITH YOUR REGULAR TOOTHPASTE  Please do not use if you have an allergy to CHG or antibacterial soaps. If your skin becomes reddened/irritated stop using the CHG.  Do not shave (including legs and underarms) for at least 48 hours prior to first CHG shower. It is OK to shave your face.  Please follow these instructions carefully.   1. Shower the NIGHT BEFORE SURGERY and the MORNING OF SURGERY  2. If you chose to wash your hair, wash your hair first as usual with your normal shampoo.  3. After you shampoo, rinse your hair and body thoroughly to remove the shampoo.  4. Wash Face and genitals (private parts) with your normal soap.   5.  Shower the NIGHT BEFORE SURGERY and the MORNING OF SURGERY with CHG Soap.   6. Use CHG Soap as you would any other liquid soap. You can apply CHG directly to the skin and wash gently with a scrungie or a clean washcloth.   7. Apply the CHG Soap to your body ONLY FROM THE NECK DOWN.  Do not use on open wounds or open sores. Avoid contact with your eyes, ears, mouth and genitals (  private parts). Wash Face and genitals (private parts)  with your normal soap.   8. Wash thoroughly, paying special attention to the area where your surgery will be performed.  9. Thoroughly rinse your body with warm water from the neck down.  10. DO NOT shower/wash with your normal soap after using and rinsing off the CHG Soap.  11. Pat yourself dry with a CLEAN TOWEL.  12. Wear CLEAN PAJAMAS to bed the night before surgery  13. Place CLEAN SHEETS on your bed the night before your surgery  14. DO NOT SLEEP WITH PETS.   Day of Surgery: Take a shower with CHG soap.  Wear Clean/Comfortable clothing the morning of surgery Do not apply any deodorants/lotions.   Remember to brush your teeth WITH YOUR REGULAR TOOTHPASTE.   Please read over the following fact sheets that you were given.

## 2020-10-31 ENCOUNTER — Encounter (HOSPITAL_COMMUNITY): Payer: Self-pay

## 2020-10-31 ENCOUNTER — Encounter: Payer: Self-pay | Admitting: Physical Therapy

## 2020-10-31 ENCOUNTER — Other Ambulatory Visit (HOSPITAL_COMMUNITY): Payer: Medicare Other

## 2020-10-31 ENCOUNTER — Ambulatory Visit (HOSPITAL_COMMUNITY)
Admission: RE | Admit: 2020-10-31 | Discharge: 2020-10-31 | Disposition: A | Discharge status: Home / Self Care | Payer: Medicare Other | Source: Ambulatory Visit | Attending: Cardiovascular Disease | Admitting: Cardiovascular Disease

## 2020-10-31 ENCOUNTER — Ambulatory Visit: Payer: Medicare Other | Attending: Cardiovascular Disease | Admitting: Physical Therapy

## 2020-10-31 ENCOUNTER — Other Ambulatory Visit: Payer: Self-pay

## 2020-10-31 ENCOUNTER — Encounter (HOSPITAL_COMMUNITY)
Admission: RE | Admit: 2020-10-31 | Discharge: 2020-10-31 | Disposition: A | Discharge status: Home / Self Care | Payer: Medicare Other | Source: Ambulatory Visit | Attending: Cardiovascular Disease | Admitting: Cardiovascular Disease

## 2020-10-31 DIAGNOSIS — I350 Nonrheumatic aortic (valve) stenosis: Secondary | ICD-10-CM

## 2020-10-31 DIAGNOSIS — R2689 Other abnormalities of gait and mobility: Secondary | ICD-10-CM | POA: Insufficient documentation

## 2020-10-31 DIAGNOSIS — Z951 Presence of aortocoronary bypass graft: Secondary | ICD-10-CM | POA: Insufficient documentation

## 2020-10-31 DIAGNOSIS — I2510 Atherosclerotic heart disease of native coronary artery without angina pectoris: Secondary | ICD-10-CM | POA: Diagnosis not present

## 2020-10-31 DIAGNOSIS — Z20822 Contact with and (suspected) exposure to covid-19: Secondary | ICD-10-CM | POA: Insufficient documentation

## 2020-10-31 DIAGNOSIS — Z01818 Encounter for other preprocedural examination: Secondary | ICD-10-CM

## 2020-10-31 HISTORY — DX: Myoneural disorder, unspecified: G70.9

## 2020-10-31 HISTORY — DX: Dyspnea, unspecified: R06.00

## 2020-10-31 LAB — BLOOD GAS, ARTERIAL
Acid-Base Excess: 0.7 mmol/L (ref 0.0–2.0)
Bicarbonate: 24.7 mmol/L (ref 20.0–28.0)
Drawn by: 58793
FIO2: 21
O2 Saturation: 97.1 %
Patient temperature: 37
pCO2 arterial: 38.4 mmHg (ref 32.0–48.0)
pH, Arterial: 7.424 (ref 7.350–7.450)
pO2, Arterial: 92.5 mmHg (ref 83.0–108.0)

## 2020-10-31 LAB — URINALYSIS, ROUTINE W REFLEX MICROSCOPIC
Bilirubin Urine: NEGATIVE
Glucose, UA: NEGATIVE mg/dL
Hgb urine dipstick: NEGATIVE
Ketones, ur: NEGATIVE mg/dL
Leukocytes,Ua: NEGATIVE
Nitrite: NEGATIVE
Protein, ur: NEGATIVE mg/dL
Specific Gravity, Urine: 1.018 (ref 1.005–1.030)
pH: 5 (ref 5.0–8.0)

## 2020-10-31 LAB — TYPE AND SCREEN
ABO/RH(D): A POS
Antibody Screen: NEGATIVE

## 2020-10-31 LAB — COMPREHENSIVE METABOLIC PANEL
ALT: 19 U/L (ref 0–44)
AST: 19 U/L (ref 15–41)
Albumin: 3.8 g/dL (ref 3.5–5.0)
Alkaline Phosphatase: 54 U/L (ref 38–126)
Anion gap: 11 (ref 5–15)
BUN: 25 mg/dL — ABNORMAL HIGH (ref 8–23)
CO2: 23 mmol/L (ref 22–32)
Calcium: 9.1 mg/dL (ref 8.9–10.3)
Chloride: 105 mmol/L (ref 98–111)
Creatinine, Ser: 1.9 mg/dL — ABNORMAL HIGH (ref 0.61–1.24)
GFR, Estimated: 34 mL/min — ABNORMAL LOW (ref >60)
Glucose, Bld: 92 mg/dL (ref 70–99)
Potassium: 4 mmol/L (ref 3.5–5.1)
Sodium: 139 mmol/L (ref 135–145)
Total Bilirubin: 0.9 mg/dL (ref 0.3–1.2)
Total Protein: 6.6 g/dL (ref 6.5–8.1)

## 2020-10-31 LAB — SARS CORONAVIRUS 2 (TAT 6-24 HRS): SARS Coronavirus 2: NEGATIVE

## 2020-10-31 LAB — CBC
HCT: 47.9 % (ref 39.0–52.0)
Hemoglobin: 16 g/dL (ref 13.0–17.0)
MCH: 30.2 pg (ref 26.0–34.0)
MCHC: 33.4 g/dL (ref 30.0–36.0)
MCV: 90.5 fL (ref 80.0–100.0)
Platelets: 233 10*3/uL (ref 150–400)
RBC: 5.29 MIL/uL (ref 4.22–5.81)
RDW: 13.1 % (ref 11.5–15.5)
WBC: 9 10*3/uL (ref 4.0–10.5)
nRBC: 0 % (ref 0.0–0.2)

## 2020-10-31 LAB — PROTIME-INR
INR: 1.1 (ref 0.8–1.2)
Prothrombin Time: 13.5 seconds (ref 11.4–15.2)

## 2020-10-31 LAB — SURGICAL PCR SCREEN
MRSA, PCR: NEGATIVE
Staphylococcus aureus: NEGATIVE

## 2020-10-31 NOTE — Therapy (Signed)
[place], [place], [address] Phone: [phone]   Fax:  [phone]  Physical Therapy Evaluation  Patient Details  Name: [name]. MRN: [id number] Date of Birth: 12-Jul-1934 Referring Provider (PT): Dr Sherren Mocha   Encounter Date: 10/31/2020   PT End of Session - 10/31/20 1122    Visit Number 1    Number of Visits 1    Date for PT Re-Evaluation 10/31/20    PT Start Time 0845    PT Stop Time 0915    PT Time Calculation (min) 30 min    Activity Tolerance Patient tolerated treatment well    Behavior During Therapy [facility] for tasks assessed/performed           Past Medical History:  Diagnosis Date  . Arthritis    OA- lumbar  . Atrial fibrillation ([place])   . BPH (benign prostatic hypertrophy)   . BRADYCARDIA   . CAD    s/p CABG in 2007 x 5  . Carotid stenosis    0-39% bilaterally  . Complication of anesthesia    ? procaine allergy, pt. remarks that he had passing out surrounding Prostate biopsy   . Dyspnea   . Dysrhythmia   . Heart murmur   . History of kidney stones 2010   at [facility] treated with cystoscopy  . HYPERCHOLESTEROLEMIA    statin intolerant  . HYPERTENSION   . Long term (current) use of anticoagulants   . NEPHROLITHIASIS   . Neuromuscular disorder (HCC)    numbness in fingers bilaterally  . PAF (paroxysmal atrial fibrillation) (HCC)    briefly noted on Holter monitor  . Prostate cancer ([place]) 2014   seed implant  . Severe aortic stenosis   . Skin cancer    sqaumous- top of head  . Stroke [facility])    mini stroke   . Thoracic ascending aortic aneurysm (HCC)    4.4 cm 08/21/18    Past Surgical History:  Procedure Laterality Date  . ANTERIOR LAT LUMBAR FUSION Left 07/23/2019   Procedure: Lumbar Two-Three, Lumbar Three-Four, Lumbar Four-Five XLIF, pedicle screw fixation, mazor;  Surgeon: Kristeen Miss, MD;  Location: [place];  Service: Neurosurgery;  Laterality: Left;   Lumbar Two-Three, Lumbar Three-Four, Lumbar Four-Five XLIF, pedicle screw fixation, mazor  . APPENDECTOMY  1999  . APPLICATION OF ROBOTIC ASSISTANCE FOR SPINAL PROCEDURE N/A 07/23/2019   Procedure: APPLICATION OF ROBOTIC ASSISTANCE FOR SPINAL PROCEDURE;  Surgeon: Kristeen Miss, MD;  Location: [place];  Service: Neurosurgery;  Laterality: N/A;  . cardiac arrest following CABG N/A   . COLONOSCOPY    . CORONARY ARTERY BYPASS GRAFT  2007   x 5  . EYE SURGERY     cataracts removed - done at [place], IOL in both eyes    . HERNIA REPAIR Left 1996   inguinal  . JOINT REPLACEMENT     L hip & L shoulder   . LITHOTRIPSY  2013  . LUMBAR PERCUTANEOUS PEDICLE SCREW 3 LEVEL N/A 07/23/2019   Procedure: LUMBAR PERCUTANEOUS PEDICLE SCREW 3 LEVEL;  Surgeon: Kristeen Miss, MD;  Location: [place];  Service: Neurosurgery;  Laterality: N/A;  . RADIOACTIVE SEED IMPLANT N/A 01/06/2015   Procedure: RADIOACTIVE SEED IMPLANT/BRACHYTHERAPY IMPLANT;  Surgeon: Hollice Espy, MD;  Location: [facility] ORS;  Service: Urology;  Laterality: N/A;  . RIGHT HEART CATH AND CORONARY/GRAFT ANGIOGRAPHY N/A 10/01/2020   Procedure: RIGHT HEART CATH AND CORONARY/GRAFT ANGIOGRAPHY;  Surgeon: Sherren Mocha, MD;  Location: [place]  CV LAB;  Service: Cardiovascular;  Laterality: N/A;  . TOTAL HIP ARTHROPLASTY Left     There were no vitals filed for this visit.    Subjective Assessment - 10/31/20 0849    Subjective Patient has been having progressive dysnea for 6 months. He is only able to do about 5-8 minutes of activity before he is becoming hsort of breath. he alos had back surgery in 2020 and has had continued ain since that point.    Currently in Pain? No/denies   dosent hurt right now but hurts when he walks             [facility] PT Assessment - 10/31/20 0001      Assessment   Medical Diagnosis Severe Aortic Stenosis    Referring Provider (PT) Dr Sherren Mocha    Onset Date/Surgical Date 10/31/20    Hand Dominance Left     Next MD Visit today    Prior Therapy Nothing      Precautions   Precautions None      Restrictions   Weight Bearing Restrictions No      Balance Screen   Has the patient fallen in the past 6 months No    Has the patient had a decrease in activity level because of a fear of falling?  No    Is the patient reluctant to leave their home because of a fear of falling?  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      Cognition   Overall Cognitive Status Within Functional Limits for tasks assessed    Attention Focused    Focused Attention Appears intact    Memory Appears intact    Awareness Appears intact    Problem Solving Appears intact      Sensation   Light Touch Appears Intact    Additional Comments denies parathesias      ROM / Strength   AROM / PROM / Strength AROM;PROM;Strength      AROM   Overall AROM Comments UE/LE WNL      PROM   Overall PROM Comments UE/LE WNL      Strength   Overall Strength Comments all other strength 5/5    Strength Assessment Site Hand    Right/Left hand Right;Left    Right Hand Grip (lbs) 60    Left Hand Grip (lbs) 80      Ambulation/Gait   Gait Comments walks with flexed posture            [facility] Pre-Surgical Assessment - 10/31/20 0001    5 Meter Walk Test- trial 1 6 sec    5 Meter Walk Test- trial 2 6 sec.     5 Meter Walk Test- trial 3 8 sec.    5 meter walk test average 6.67 sec    4 Stage Balance Test tolerated for:  10 sec.    4 Stage Balance Test Position 3    comment could not maintain single leg stance    Sit To Stand Test- trial 1 5 sec.    Comment 20 sec    ADL/IADL Independent with: Bathing;Dressing;Meal prep;Finances    ADL/IADL Needs Assistance with: Valla Leaver work    6 Minute Walk- Baseline yes    BP (mmHg) 140/80    HR (bpm) 76    02 Sat (%RA) 97 %    Modified Borg Scale for Dyspnea 0- Nothing at all  Perceived Rate of Exertion (Borg) 6-    6 Minute Walk  Post Test yes    BP (mmHg) 157/82    HR (bpm) 88    02 Sat (%RA) 98 %    Modified Borg Scale for Dyspnea 5- Strong or hard breathing    Perceived Rate of Exertion (Borg) 13- Somewhat hard    Aerobic Endurance Distance Walked 925    Endurance additional comments 32% disability                    Objective measurements completed on examination: See above findings.               PT Education - 10/31/20 1121    Education Details HEP and symptom mangement    Person(s) Educated Patient    Methods Explanation;Demonstration;Tactile cues;Verbal cues    Comprehension Verbalized understanding;Returned demonstration;Verbal cues required;Tactile cues required                       Plan - 10/31/20 1123    Clinical Impression Statement See below    Personal Factors and Comorbidities Comorbidity 1    Comorbidities Lwo Back Surgery    Clinical Decision Making Low    Rehab Potential Excellent    PT Frequency One time visit    PT Treatment/Interventions ADLs/Self Care Home Management    Consulted and Agree with Plan of Care Patient           Patient will benefit from skilled therapeutic intervention in order to improve the following deficits and impairments:  Abnormal gait,Difficulty walking,Decreased endurance,Cardiopulmonary status limiting activity  Visit Diagnosis: Other abnormalities of gait and mobility    Clinical Impression Statement: Pt is a 87 yo male presenting to OP PT for evaluation prior to possible TAVR surgery due to severe aortic stenosis. Pt reports onset of dyspnea approximately 6 months ago. Symptoms are limiting ability to work. Pt presents with normal ROM and strength, decreased balance and is not at high fall risk 4 stage balance test, decreased walking speed and decreased aerobic endurance per 6 minute walk test. Pt ambulated 925 feet in 5:27. At time of rest, patient's HR was 88 bpm and O2 was 98 on room air. Pt reported 5/10 shortness  of breath on modified scale for dyspnea. Pt ambulated a total of 925 feet in 6 minute walk. B/P increased significantly with 6 minute walk test. Based on the Short Physical Performance Battery, patient has a frailty rating of 8/12 with </= 5/12 considered frail.    Problem List Patient Active Problem List   Diagnosis Date Noted  . Lumbar stenosis with neurogenic claudication 07/23/2019  . TIA (transient ischemic attack)   . Stroke ([place])   . Skin cancer   . Prostate cancer ([place])   . PAF (paroxysmal atrial fibrillation) ([place])   . Long term (current) use of anticoagulants   . Complication of anesthesia   . Carotid stenosis   . Atrial fibrillation ([place])   . Aortic stenosis   . Paroxysmal atrial fibrillation ([place]) 02/15/2014  . Cerebrovascular disease 01/16/2013  . Pre-operative clearance 01/11/2012  . Dizziness 06/03/2011  . Aortic valve disorder 10/19/2010  . BRADYCARDIA 05/29/2009  . HYPERCHOLESTEROLEMIA 05/26/2009  . HYPERLIPIDEMIA 05/26/2009  . Essential hypertension 05/26/2009  . Coronary atherosclerosis 05/26/2009  . NEPHROLITHIASIS 05/26/2009    Carney Living PT DPT  10/31/2020, 11:24 AM  [facility] [address] [place], [place], [address] Phone:  [phone]   Fax:  [phone]  Name: [name]. MRN: [id number] Date of Birth: Dec 07, 1933

## 2020-10-31 NOTE — Progress Notes (Signed)
PCP - Laurance Flatten Cardiologist - Kirk Ruths   PPM/ICD - denies   Chest x-ray - 10/31/20 EKG - 10/31/20 Stress Test -07/06/19  ECHO - 09/23/20 Cardiac Cath - 10/01/20  Sleep Study - denies  Continue taking all medications without change through the day before surgery.  On the morning of surgery do not take any medications.    ERAS Protcol -no  COVID TEST- 10/31/20   Anesthesia review: yes, TAVR  Patient denies shortness of breath, fever, cough and chest pain at PAT appointment   All instructions explained to the patient, with a verbal understanding of the material. Patient agrees to go over the instructions while at home for a better understanding. Patient also instructed to self quarantine after being tested for COVID-19. The opportunity to ask questions was provided.

## 2020-11-03 MED ORDER — SODIUM CHLORIDE 0.9 % IV SOLN
INTRAVENOUS | Status: DC
  Filled 2020-11-03: qty 30

## 2020-11-03 MED ORDER — NOREPINEPHRINE 4 MG/250ML-% IV SOLN
0.0000 ug/min | INTRAVENOUS | Status: AC
  Administered 2020-11-04: 2 ug/min via INTRAVENOUS
  Filled 2020-11-03: qty 250

## 2020-11-03 MED ORDER — POTASSIUM CHLORIDE 2 MEQ/ML IV SOLN
80.0000 meq | INTRAVENOUS | Status: DC
  Filled 2020-11-03: qty 40

## 2020-11-03 MED ORDER — SODIUM CHLORIDE 0.9 % IV SOLN
1.5000 g | INTRAVENOUS | Status: AC
  Administered 2020-11-04: 1.5 g via INTRAVENOUS
  Filled 2020-11-03: qty 1.5

## 2020-11-03 MED ORDER — MAGNESIUM SULFATE 50 % IJ SOLN
40.0000 meq | INTRAMUSCULAR | Status: DC
  Filled 2020-11-03: qty 9.85

## 2020-11-03 MED ORDER — VANCOMYCIN HCL 1500 MG/300ML IV SOLN
1500.0000 mg | INTRAVENOUS | Status: AC
  Administered 2020-11-04: 1500 mg via INTRAVENOUS
  Filled 2020-11-03 (×2): qty 300

## 2020-11-03 MED ORDER — DEXMEDETOMIDINE HCL IN NACL 400 MCG/100ML IV SOLN
0.1000 ug/kg/h | INTRAVENOUS | Status: AC
  Administered 2020-11-04: 1 ug/kg/h via INTRAVENOUS
  Administered 2020-11-04: 57.5 ug via INTRAVENOUS
  Filled 2020-11-03 (×2): qty 100

## 2020-11-03 NOTE — Anesthesia Preprocedure Evaluation (Addendum)
Anesthesia Evaluation  Patient identified by MRN, date of birth, ID band Patient awake    Reviewed: Allergy & Precautions, NPO status , Patient's Chart, lab work & pertinent test results  Airway Mallampati: II  TM Distance: >3 FB     Dental   Pulmonary shortness of breath, former smoker,    breath sounds clear to auscultation       Cardiovascular hypertension, + CAD  + dysrhythmias + Valvular Problems/Murmurs AS  Rhythm:Regular Rate:Normal     Neuro/Psych TIA Neuromuscular disease CVA    GI/Hepatic negative GI ROS, Neg liver ROS,   Endo/Other    Renal/GU Renal disease     Musculoskeletal  (+) Arthritis ,   Abdominal   Peds  Hematology   Anesthesia Other Findings   Reproductive/Obstetrics                           Anesthesia Physical Anesthesia Plan  ASA: III  Anesthesia Plan: MAC   Post-op Pain Management:    Induction:   PONV Risk Score and Plan: 2 and Ondansetron and Midazolam  Airway Management Planned: Simple Face Mask  Additional Equipment: Arterial line  Intra-op Plan:   Post-operative Plan:   Informed Consent: I have reviewed the patients History and Physical, chart, labs and discussed the procedure including the risks, benefits and alternatives for the proposed anesthesia with the patient or authorized representative who has indicated his/her understanding and acceptance.     Dental advisory given  Plan Discussed with: Anesthesiologist and CRNA  Anesthesia Plan Comments: (PAT note written 11/03/2020 by Myra Gianotti, PA-C. )      Anesthesia Quick Evaluation

## 2020-11-03 NOTE — Progress Notes (Signed)
Anesthesia Chart Review:  Case: [id number] Date/Time: 11/04/20 0900   Procedures:      TRANSCATHETER AORTIC VALVE REPLACEMENT, TRANSFEMORAL (N/A )     TRANSESOPHAGEAL ECHOCARDIOGRAM (TEE) (N/A )   Anesthesia type: General   Pre-op diagnosis: Severe Aortic Stenosis   Location: [facility] CATH LAB 6 / [place] INVASIVE CV LAB   Providers: Sherren Mocha, MD      DISCUSSION: Patient is an 87 year old male scheduled for the above procedure.   History includes former smoker (quit 1970), HTN, hypercholesterolemia, CAD (s/p CABG: LIMA-LAD, SVG-D1-CX, SVG-PDA-RPLA 07/13/06), bradycardia (post-CABG 2nd/3rd degree AV block, resolved off B-blocker 2007), PAF, murmur/aortic stenosis (severe AS 09/2020), ascending TAA (4.5 cm 10/10/20), carotid stenosis (1-39%, 10/10/20), TIA (09/2010), dyspnea, numbness (bilaterl fingers), prostate cancer (s/p radioactive seed implant/brachytherapy implant 01/06/15), skin cancer (SCC scalp, s/p excision and complex closure 03/20/19; s/p wide local excision excision BCC right cheek 10/12/19), back surgery (L2-5 XLIF posterolateral L3-5 arthrodesis 07/23/19). Labs trends suggest CKD. He also reported syncope surrounding his prostate biopsy, reported procaine used. BMI is consistent with obesity.  Preoperative labs show Creatinine of 1.90, similar to result from 10/10/20 (1.93) and 1.70-1.77 in 07/2019.   Moderna COVID-19 vaccine 05/30/20. 10/31/20 presurgical COVID-19 test negative. Anesthesia team to evaluate on the day of surgery.    VS: BP 112/81   Pulse 77   Temp 36.6 C (Oral)   Resp 18   Ht 6\' 3"  (1.905 m)   Wt 115.6 kg   SpO2 99%   BMI 31.86 kg/m    PROVIDERS: Jacklynn Ganong, MD is PCP  Kirk Ruths, MD is cardiologist Andi Devon, MD is surgical oncologist [facility] Everywhere) Hollice Espy, MD is urologist Myrtie Hawk, MD is dermatologist ([place])   LABS: Labs reviewed: Acceptable for surgery. See DISCUSSION. (all labs ordered are listed, but  only abnormal results are displayed)  Labs Reviewed  COMPREHENSIVE METABOLIC PANEL - Abnormal; Notable for the following components:      Result Value   BUN 25 (*)    Creatinine, Ser 1.90 (*)    GFR, Estimated 34 (*)    All other components within normal limits  SURGICAL PCR SCREEN  SARS CORONAVIRUS 2 (TAT 6-24 HRS)  CBC  PROTIME-INR  URINALYSIS, ROUTINE W REFLEX MICROSCOPIC  BLOOD GAS, ARTERIAL  TYPE AND SCREEN     IMAGES: CXR 10/31/20: IMPRESSION: Negative for acute cardiopulmonary disease  CTA Chest/abd/pelvis 10/10/20: IMPRESSION: 1. Vascular findings and measurements pertinent to potential TAVR procedure, as detailed above. 2. Severe thickening calcification of the aortic valve, compatible with reported clinical history of severe aortic stenosis. 3. Aortic atherosclerosis, in addition to left main and 3 vessel coronary artery disease. Status post median sternotomy for CABG including LIMA to the LAD. 4. Mild aneurysmal dilatation of the ascending thoracic aorta (4.5 cm in diameter). Ascending thoracic aortic aneurysm. Recommend semi-annual imaging followup by CTA or MRA and referral to cardiothoracic surgery if not already obtained. This recommendation follows 2010 ACCF/AHA/AATS/ACR/ASA/SCA/SCAI/SIR/STS/SVM Guidelines for the Diagnosis and Management of Patients With Thoracic Aortic Disease. Circulation. 2010; 121: J811-B147. Aortic aneurysm NOS (ICD10-I71.9). 5. Additional incidental findings, as above. [See full report]   EKG: 10/31/20: Sinus rhythm with 1st degree A-V block Left axis deviation Incomplete right bundle branch block Inferior infarct , age undetermined Abnormal ECG No significant change since last tracing Confirmed by Kathlyn Sacramento [phone]) on 10/31/2020 12:35:55 PM   CV: Caroid [place] 10/10/20: Summary:  Right Carotid: Velocities in the right ICA are consistent  with a 1-39%  stenosis.  Left Carotid: Velocities in the left ICA are consistent with a  1-39%  stenosis.  Vertebrals: Bilateral vertebral arteries demonstrate antegrade flow.    CT Coronary 10/10/20: IMPRESSION: 1. Aortic stenosis; CINE, calcium score, and planimetry less consistent with severe aortic stenosis. Findings pertinent to TAVR procedure are detailed above. [See full report] 2. Coronary Arteries: Calcified native coronary arteries with bypass grafting noted. 3.  Moderate ascending aortic aneurysm noted: 45 mm. 4.  Aortic atherosclerosis noted.   Cardiac cath 10/01/20:  Ost RCA to Prox RCA lesion is 80% stenosed.  Mid RCA lesion is 90% stenosed.  Prox LAD lesion is 100% stenosed.  LIMA graft was visualized by angiography and is normal in caliber.  The graft exhibits no disease.  Seq SVG- PDA/PLA graft was visualized by angiography.  Origin to Prox Graft lesion before 1st RPL is 50% stenosed.  Mid LM to Dist LM lesion is 80% stenosed.  Ost Cx to Prox Cx lesion is 100% stenosed.  Seq SVG- Diagonal and OM1 and is normal in caliber.  The graft exhibits no disease.  There is severe aortic valve stenosis.   1.  Severe native vessel coronary artery disease with severe stenosis of the RCA, left main, LAD, and occlusion of the circumflex 2.  Status post aortocoronary bypass surgery with continued patency of the LIMA to LAD, patency of the sequential saphenous vein graft to first diagonal and first OM, and patency of the sequential saphenous vein graft to the right PDA and PLA branches.  There appears to be moderate stenosis of the saphenous vein graft to right coronary artery with subselective visualization. 3.  Known severe aortic stenosis with heavy calcification of the aortic valve on plain fluoroscopy 4.  Normal right heart pressures  Recommend: Multidisciplinary evaluation for TAVR.  Okay to proceed with CT angiography studies using renal protocol followed by surgical consultation with Dr. Roxy Manns.   Echo 09/23/20: IMPRESSIONS  1. Sigmoid septum. Left  ventricular ejection fraction, by estimation, is  60 to 65%. The left ventricle has normal function. The left ventricle has  no regional wall motion abnormalities. There is moderate left ventricular  hypertrophy. Left ventricular  diastolic parameters are consistent with Grade I diastolic dysfunction  (impaired relaxation).  2. Right ventricular systolic function is normal. The right ventricular  size is normal.  3. Left atrial size was mildly dilated.  4. The mitral valve is normal in structure. No evidence of mitral valve  regurgitation. No evidence of mitral stenosis.  5. The aortic valve is normal in structure. There is moderate  calcification of the aortic valve. There is moderate thickening of the  aortic valve. Aortic valve regurgitation is mild. Severe aortic valve  stenosis. Aortic valve area, by VTI measures 0.82  cm. Aortic valve mean gradient measures 36.0 mmHg.  6. Aneurysm of the ascending aorta, measuring 45 mm.  7. The inferior vena cava is normal in size with greater than 50%  respiratory variability, suggesting right atrial pressure of 3 mmHg.    48 hour Holter monitor 09/10/10: Impression: Sinus rhythm with PVCs and PACs and PAT.  One brief run of atrial fibrillation.   Past Medical History:  Diagnosis Date  . Arthritis    OA- lumbar  . Atrial fibrillation ([place])   . BPH (benign prostatic hypertrophy)   . BRADYCARDIA   . CAD    s/p CABG in 2007 x 5  . Carotid stenosis    0-39% bilaterally  .  Complication of anesthesia    ? procaine allergy, pt. remarks that he had passing out surrounding Prostate biopsy   . Dyspnea   . Dysrhythmia   . Heart murmur   . History of kidney stones 2010   at [facility] treated with cystoscopy  . HYPERCHOLESTEROLEMIA    statin intolerant  . HYPERTENSION   . Long term (current) use of anticoagulants   . NEPHROLITHIASIS   . Neuromuscular disorder (HCC)    numbness in fingers bilaterally  . PAF (paroxysmal atrial  fibrillation) (HCC)    briefly noted on Holter monitor  . Prostate cancer ([place]) 2014   seed implant  . Severe aortic stenosis   . Skin cancer    sqaumous- top of head  . Stroke [facility])    mini stroke   . Thoracic ascending aortic aneurysm ([facility])    4.4 cm 08/21/18    Past Surgical History:  Procedure Laterality Date  . ANTERIOR LAT LUMBAR FUSION Left 07/23/2019   Procedure: Lumbar Two-Three, Lumbar Three-Four, Lumbar Four-Five XLIF, pedicle screw fixation, mazor;  Surgeon: Kristeen Miss, MD;  Location: [place];  Service: Neurosurgery;  Laterality: Left;  Lumbar Two-Three, Lumbar Three-Four, Lumbar Four-Five XLIF, pedicle screw fixation, mazor  . APPENDECTOMY  1999  . APPLICATION OF ROBOTIC ASSISTANCE FOR SPINAL PROCEDURE N/A 07/23/2019   Procedure: APPLICATION OF ROBOTIC ASSISTANCE FOR SPINAL PROCEDURE;  Surgeon: Kristeen Miss, MD;  Location: [place];  Service: Neurosurgery;  Laterality: N/A;  . cardiac arrest following CABG N/A   . COLONOSCOPY    . CORONARY ARTERY BYPASS GRAFT  2007   x 5  . EYE SURGERY     cataracts removed - done at [facility], IOL in both eyes    . HERNIA REPAIR Left 1996   inguinal  . JOINT REPLACEMENT     L hip & L shoulder   . LITHOTRIPSY  2013  . LUMBAR PERCUTANEOUS PEDICLE SCREW 3 LEVEL N/A 07/23/2019   Procedure: LUMBAR PERCUTANEOUS PEDICLE SCREW 3 LEVEL;  Surgeon: Kristeen Miss, MD;  Location: [place];  Service: Neurosurgery;  Laterality: N/A;  . RADIOACTIVE SEED IMPLANT N/A 01/06/2015   Procedure: RADIOACTIVE SEED IMPLANT/BRACHYTHERAPY IMPLANT;  Surgeon: Hollice Espy, MD;  Location: [facility] ORS;  Service: Urology;  Laterality: N/A;  . RIGHT HEART CATH AND CORONARY/GRAFT ANGIOGRAPHY N/A 10/01/2020   Procedure: RIGHT HEART CATH AND CORONARY/GRAFT ANGIOGRAPHY;  Surgeon: Sherren Mocha, MD;  Location: [place] CV LAB;  Service: Cardiovascular;  Laterality: N/A;  . TOTAL HIP ARTHROPLASTY Left     MEDICATIONS: . aspirin EC 81 MG tablet  . Cholecalciferol (VITAMIN  D-3) 125 MCG (5000 UT) TABS  . cycloSPORINE (RESTASIS) 0.05 % ophthalmic emulsion  . hydrochlorothiazide (HYDRODIURIL) 25 MG tablet  . ibuprofen (ADVIL) 200 MG tablet  . tamsulosin (FLOMAX) 0.4 MG CAPS capsule   . sodium chloride flush (NS) 0.9 % injection 3 mL   . [START ON 11/04/2020] cefUROXime (ZINACEF) 1.5 g in sodium chloride 0.9 % 100 mL IVPB  . [START ON 11/04/2020] dexmedetomidine (PRECEDEX) 400 MCG/100ML (4 mcg/mL) infusion  . [START ON 11/04/2020] heparin 30,000 units/NS 1000 mL solution for CELLSAVER  . [START ON 11/04/2020] magnesium sulfate (IV Push/IM) injection 40 mEq  . [START ON 11/04/2020] norepinephrine (LEVOPHED) 4mg  in 277mL premix infusion  . [START ON 11/04/2020] potassium chloride injection 80 mEq  . [START ON 11/04/2020] vancomycin (VANCOREADY) IVPB 1500 mg/300 mL    Myra Gianotti, PA-C Surgical Short Stay/Anesthesiology [facility] Phone [phone] [facility] Phone [phone] 11/03/2020 10:11 AM

## 2020-11-04 ENCOUNTER — Inpatient Hospital Stay (HOSPITAL_COMMUNITY)
Admission: RE | Admit: 2020-11-04 | Discharge: 2020-11-05 | DRG: 267 | Disposition: A | Discharge status: Home / Self Care | Payer: Medicare Other | Attending: Thoracic Surgery (Cardiothoracic Vascular Surgery) | Admitting: Thoracic Surgery (Cardiothoracic Vascular Surgery)

## 2020-11-04 ENCOUNTER — Encounter (HOSPITAL_COMMUNITY)
Admission: RE | Disposition: A | Discharge status: Home / Self Care | Payer: Self-pay | Source: Home / Self Care | Attending: Thoracic Surgery (Cardiothoracic Vascular Surgery)

## 2020-11-04 ENCOUNTER — Inpatient Hospital Stay (HOSPITAL_COMMUNITY): Payer: Medicare Other | Admitting: Vascular Surgery

## 2020-11-04 ENCOUNTER — Encounter (HOSPITAL_COMMUNITY): Payer: Self-pay | Admitting: Cardiovascular Disease

## 2020-11-04 ENCOUNTER — Inpatient Hospital Stay (HOSPITAL_COMMUNITY): Payer: Medicare Other | Admitting: Anesthesiology

## 2020-11-04 ENCOUNTER — Other Ambulatory Visit: Payer: Self-pay

## 2020-11-04 ENCOUNTER — Inpatient Hospital Stay (HOSPITAL_COMMUNITY): Payer: Medicare Other

## 2020-11-04 ENCOUNTER — Other Ambulatory Visit: Payer: Self-pay | Admitting: Physician Assistant

## 2020-11-04 DIAGNOSIS — Z6831 Body mass index (BMI) 31.0-31.9, adult: Secondary | ICD-10-CM

## 2020-11-04 DIAGNOSIS — M47816 Spondylosis without myelopathy or radiculopathy, lumbar region: Secondary | ICD-10-CM | POA: Diagnosis present

## 2020-11-04 DIAGNOSIS — Z8249 Family history of ischemic heart disease and other diseases of the circulatory system: Secondary | ICD-10-CM

## 2020-11-04 DIAGNOSIS — I480 Paroxysmal atrial fibrillation: Secondary | ICD-10-CM | POA: Diagnosis present

## 2020-11-04 DIAGNOSIS — I10 Essential (primary) hypertension: Secondary | ICD-10-CM | POA: Diagnosis present

## 2020-11-04 DIAGNOSIS — N400 Enlarged prostate without lower urinary tract symptoms: Secondary | ICD-10-CM | POA: Diagnosis present

## 2020-11-04 DIAGNOSIS — Z7901 Long term (current) use of anticoagulants: Secondary | ICD-10-CM | POA: Diagnosis not present

## 2020-11-04 DIAGNOSIS — G459 Transient cerebral ischemic attack, unspecified: Secondary | ICD-10-CM | POA: Diagnosis present

## 2020-11-04 DIAGNOSIS — I700 Atherosclerosis of aorta: Secondary | ICD-10-CM | POA: Diagnosis present

## 2020-11-04 DIAGNOSIS — Z8673 Personal history of transient ischemic attack (TIA), and cerebral infarction without residual deficits: Secondary | ICD-10-CM

## 2020-11-04 DIAGNOSIS — Z888 Allergy status to other drugs, medicaments and biological substances status: Secondary | ICD-10-CM | POA: Diagnosis not present

## 2020-11-04 DIAGNOSIS — G479 Sleep disorder, unspecified: Secondary | ICD-10-CM | POA: Diagnosis present

## 2020-11-04 DIAGNOSIS — Z952 Presence of prosthetic heart valve: Secondary | ICD-10-CM

## 2020-11-04 DIAGNOSIS — I129 Hypertensive chronic kidney disease with stage 1 through stage 4 chronic kidney disease, or unspecified chronic kidney disease: Secondary | ICD-10-CM | POA: Diagnosis present

## 2020-11-04 DIAGNOSIS — C61 Malignant neoplasm of prostate: Secondary | ICD-10-CM | POA: Diagnosis present

## 2020-11-04 DIAGNOSIS — M48062 Spinal stenosis, lumbar region with neurogenic claudication: Secondary | ICD-10-CM | POA: Diagnosis present

## 2020-11-04 DIAGNOSIS — Z8546 Personal history of malignant neoplasm of prostate: Secondary | ICD-10-CM | POA: Diagnosis not present

## 2020-11-04 DIAGNOSIS — Z006 Encounter for examination for normal comparison and control in clinical research program: Secondary | ICD-10-CM

## 2020-11-04 DIAGNOSIS — I2510 Atherosclerotic heart disease of native coronary artery without angina pectoris: Secondary | ICD-10-CM | POA: Diagnosis present

## 2020-11-04 DIAGNOSIS — I7121 Aneurysm of the ascending aorta, without rupture: Secondary | ICD-10-CM | POA: Diagnosis present

## 2020-11-04 DIAGNOSIS — E782 Mixed hyperlipidemia: Secondary | ICD-10-CM | POA: Diagnosis present

## 2020-11-04 DIAGNOSIS — Z85828 Personal history of other malignant neoplasm of skin: Secondary | ICD-10-CM | POA: Diagnosis not present

## 2020-11-04 DIAGNOSIS — E669 Obesity, unspecified: Secondary | ICD-10-CM | POA: Diagnosis present

## 2020-11-04 DIAGNOSIS — Z951 Presence of aortocoronary bypass graft: Secondary | ICD-10-CM

## 2020-11-04 DIAGNOSIS — Z87442 Personal history of urinary calculi: Secondary | ICD-10-CM | POA: Diagnosis not present

## 2020-11-04 DIAGNOSIS — N1832 Chronic kidney disease, stage 3b: Secondary | ICD-10-CM | POA: Diagnosis present

## 2020-11-04 DIAGNOSIS — Z884 Allergy status to anesthetic agent status: Secondary | ICD-10-CM | POA: Diagnosis not present

## 2020-11-04 DIAGNOSIS — I350 Nonrheumatic aortic (valve) stenosis: Secondary | ICD-10-CM

## 2020-11-04 DIAGNOSIS — I712 Thoracic aortic aneurysm, without rupture: Secondary | ICD-10-CM | POA: Diagnosis present

## 2020-11-04 DIAGNOSIS — I679 Cerebrovascular disease, unspecified: Secondary | ICD-10-CM | POA: Diagnosis present

## 2020-11-04 DIAGNOSIS — E7800 Pure hypercholesterolemia, unspecified: Secondary | ICD-10-CM | POA: Diagnosis present

## 2020-11-04 DIAGNOSIS — Z87891 Personal history of nicotine dependence: Secondary | ICD-10-CM

## 2020-11-04 HISTORY — DX: Presence of prosthetic heart valve: Z95.2

## 2020-11-04 HISTORY — PX: TRANSCATHETER AORTIC VALVE REPLACEMENT, TRANSFEMORAL: SHX6400

## 2020-11-04 HISTORY — PX: TEE WITHOUT CARDIOVERSION: SHX5443

## 2020-11-04 LAB — ECHOCARDIOGRAM LIMITED
AR max vel: 0.79 cm2
AV Area VTI: 0.8 cm2
AV Area mean vel: 0.83 cm2
AV Mean grad: 39.2 mmHg
AV Peak grad: 73.7 mmHg
Ao pk vel: 4.29 m/s

## 2020-11-04 LAB — POCT I-STAT, CHEM 8
BUN: 32 mg/dL — ABNORMAL HIGH (ref 8–23)
BUN: 31 mg/dL — ABNORMAL HIGH (ref 8–23)
Calcium, Ion: 1.27 mmol/L (ref 1.15–1.40)
Calcium, Ion: 1.23 mmol/L (ref 1.15–1.40)
Chloride: 104 mmol/L (ref 98–111)
Chloride: 105 mmol/L (ref 98–111)
Creatinine, Ser: 1.6 mg/dL — ABNORMAL HIGH (ref 0.61–1.24)
Creatinine, Ser: 1.5 mg/dL — ABNORMAL HIGH (ref 0.61–1.24)
Glucose, Bld: 114 mg/dL — ABNORMAL HIGH (ref 70–99)
Glucose, Bld: 122 mg/dL — ABNORMAL HIGH (ref 70–99)
HCT: 42 % (ref 39.0–52.0)
HCT: 38 % — ABNORMAL LOW (ref 39.0–52.0)
Hemoglobin: 14.3 g/dL (ref 13.0–17.0)
Hemoglobin: 12.9 g/dL — ABNORMAL LOW (ref 13.0–17.0)
Potassium: 4 mmol/L (ref 3.5–5.1)
Potassium: 4.1 mmol/L (ref 3.5–5.1)
Sodium: 142 mmol/L (ref 135–145)
Sodium: 141 mmol/L (ref 135–145)
TCO2: 28 mmol/L (ref 22–32)
TCO2: 28 mmol/L (ref 22–32)

## 2020-11-04 LAB — POCT ACTIVATED CLOTTING TIME
Activated Clotting Time: 136 seconds
Activated Clotting Time: 119 seconds
Activated Clotting Time: 297 seconds
Activated Clotting Time: 320 seconds

## 2020-11-04 SURGERY — IMPLANTATION, AORTIC VALVE, TRANSCATHETER, FEMORAL APPROACH
Anesthesia: Monitor Anesthesia Care

## 2020-11-04 MED ORDER — IOHEXOL 350 MG/ML SOLN
INTRAVENOUS | Status: AC
  Filled 2020-11-04: qty 1

## 2020-11-04 MED ORDER — SODIUM CHLORIDE 0.9% FLUSH
3.0000 mL | Freq: Two times a day (BID) | INTRAVENOUS | Status: DC
  Administered 2020-11-04: 3 mL via INTRAVENOUS

## 2020-11-04 MED ORDER — CYCLOSPORINE 0.05 % OP EMUL: 1.0000 [drp] | Freq: Two times a day (BID) | OPHTHALMIC | Status: DC | PRN

## 2020-11-04 MED ORDER — HEPARIN (PORCINE) IN NACL 1000-0.9 UT/500ML-% IV SOLN
INTRAVENOUS | Status: DC | PRN
  Administered 2020-11-04 (×3): 500 mL

## 2020-11-04 MED ORDER — LIDOCAINE HCL (PF) 1 % IJ SOLN
INTRAMUSCULAR | Status: AC
  Filled 2020-11-04: qty 30

## 2020-11-04 MED ORDER — TAMSULOSIN HCL 0.4 MG PO CAPS
0.8000 mg | ORAL_CAPSULE | Freq: Every day | ORAL | Status: DC
  Administered 2020-11-04: 0.8 mg via ORAL
  Filled 2020-11-04: qty 2

## 2020-11-04 MED ORDER — ONDANSETRON HCL 4 MG/2ML IJ SOLN: 4.0000 mg | Freq: Four times a day (QID) | INTRAMUSCULAR | Status: DC | PRN

## 2020-11-04 MED ORDER — SODIUM CHLORIDE 0.9 % IV SOLN: INTRAVENOUS | Status: DC

## 2020-11-04 MED ORDER — HEPARIN (PORCINE) IN NACL 1000-0.9 UT/500ML-% IV SOLN
INTRAVENOUS | Status: AC
  Filled 2020-11-04: qty 1000

## 2020-11-04 MED ORDER — ACETAMINOPHEN 325 MG PO TABS: 650.0000 mg | ORAL_TABLET | Freq: Four times a day (QID) | ORAL | Status: DC | PRN

## 2020-11-04 MED ORDER — CHLORHEXIDINE GLUCONATE 0.12 % MT SOLN: 15.0000 mL | Freq: Once | OROMUCOSAL | Status: AC

## 2020-11-04 MED ORDER — ORAL CARE MOUTH RINSE: 15.0000 mL | Freq: Once | OROMUCOSAL | Status: AC

## 2020-11-04 MED ORDER — MORPHINE SULFATE (PF) 4 MG/ML IV SOLN: 1.0000 mg | INTRAVENOUS | Status: DC | PRN

## 2020-11-04 MED ORDER — CHLORHEXIDINE GLUCONATE 4 % EX LIQD: 60.0000 mL | Freq: Once | CUTANEOUS | Status: DC

## 2020-11-04 MED ORDER — EPHEDRINE SULFATE-NACL 50-0.9 MG/10ML-% IV SOSY
PREFILLED_SYRINGE | INTRAVENOUS | Status: DC | PRN
  Administered 2020-11-04: 5 mg via INTRAVENOUS
  Administered 2020-11-04: 10 mg via INTRAVENOUS

## 2020-11-04 MED ORDER — VANCOMYCIN HCL IN DEXTROSE 1-5 GM/200ML-% IV SOLN
1000.0000 mg | Freq: Once | INTRAVENOUS | Status: AC
  Administered 2020-11-04: 1000 mg via INTRAVENOUS
  Filled 2020-11-04: qty 200

## 2020-11-04 MED ORDER — CLOPIDOGREL BISULFATE 75 MG PO TABS
75.0000 mg | ORAL_TABLET | Freq: Every day | ORAL | Status: DC
  Administered 2020-11-05: 75 mg via ORAL
  Filled 2020-11-04: qty 1

## 2020-11-04 MED ORDER — SODIUM CHLORIDE 0.9% FLUSH: 3.0000 mL | INTRAVENOUS | Status: DC | PRN

## 2020-11-04 MED ORDER — ACETAMINOPHEN 650 MG RE SUPP: 650.0000 mg | Freq: Four times a day (QID) | RECTAL | Status: DC | PRN

## 2020-11-04 MED ORDER — LACTATED RINGERS IV SOLN: INTRAVENOUS | Status: DC

## 2020-11-04 MED ORDER — SODIUM CHLORIDE 0.9 % IV SOLN
1.5000 g | Freq: Two times a day (BID) | INTRAVENOUS | Status: DC
  Administered 2020-11-04 – 2020-11-05 (×2): 1.5 g via INTRAVENOUS
  Filled 2020-11-04 (×3): qty 1.5

## 2020-11-04 MED ORDER — CHLORHEXIDINE GLUCONATE 4 % EX LIQD: 30.0000 mL | CUTANEOUS | Status: DC

## 2020-11-04 MED ORDER — HEPARIN SODIUM (PORCINE) 1000 UNIT/ML IJ SOLN
INTRAMUSCULAR | Status: DC | PRN
  Administered 2020-11-04: 16000 [IU] via INTRAVENOUS

## 2020-11-04 MED ORDER — LIDOCAINE HCL (PF) 1 % IJ SOLN
INTRAMUSCULAR | Status: DC | PRN
  Administered 2020-11-04 (×2): 5 mL via INTRADERMAL

## 2020-11-04 MED ORDER — CHLORHEXIDINE GLUCONATE 0.12 % MT SOLN
OROMUCOSAL | Status: AC
  Administered 2020-11-04: 15 mL via OROMUCOSAL
  Filled 2020-11-04: qty 15

## 2020-11-04 MED ORDER — CHLORHEXIDINE GLUCONATE 0.12 % MT SOLN: 15.0000 mL | Freq: Once | OROMUCOSAL | Status: DC

## 2020-11-04 MED ORDER — ASPIRIN EC 81 MG PO TBEC
81.0000 mg | DELAYED_RELEASE_TABLET | Freq: Every morning | ORAL | Status: DC
  Administered 2020-11-05: 81 mg via ORAL
  Filled 2020-11-04: qty 1

## 2020-11-04 MED ORDER — TRAMADOL HCL 50 MG PO TABS: 50.0000 mg | ORAL_TABLET | ORAL | Status: DC | PRN

## 2020-11-04 MED ORDER — ONDANSETRON HCL 4 MG/2ML IJ SOLN
INTRAMUSCULAR | Status: DC | PRN
  Administered 2020-11-04: 4 mg via INTRAVENOUS

## 2020-11-04 MED ORDER — PHENYLEPHRINE HCL-NACL 20-0.9 MG/250ML-% IV SOLN: 0.0000 ug/min | INTRAVENOUS | Status: DC

## 2020-11-04 MED ORDER — GLYCOPYRROLATE 0.2 MG/ML IJ SOLN
INTRAMUSCULAR | Status: DC | PRN
  Administered 2020-11-04 (×2): .1 mg via INTRAVENOUS

## 2020-11-04 MED ORDER — HEPARIN (PORCINE) IN NACL 1000-0.9 UT/500ML-% IV SOLN
INTRAVENOUS | Status: AC
  Filled 2020-11-04: qty 500

## 2020-11-04 MED ORDER — OXYCODONE HCL 5 MG PO TABS: 5.0000 mg | ORAL_TABLET | ORAL | Status: DC | PRN

## 2020-11-04 MED ORDER — PROPOFOL 500 MG/50ML IV EMUL
INTRAVENOUS | Status: DC | PRN
  Administered 2020-11-04: 50 ug/kg/min via INTRAVENOUS

## 2020-11-04 MED ORDER — NITROGLYCERIN IN D5W 200-5 MCG/ML-% IV SOLN: 0.0000 ug/min | INTRAVENOUS | Status: DC

## 2020-11-04 MED ORDER — PROTAMINE SULFATE 10 MG/ML IV SOLN
INTRAVENOUS | Status: DC | PRN
  Administered 2020-11-04: 10 mg via INTRAVENOUS
  Administered 2020-11-04: 150 mg via INTRAVENOUS

## 2020-11-04 MED ORDER — IOHEXOL 350 MG/ML SOLN
INTRAVENOUS | Status: DC | PRN
  Administered 2020-11-04: 30 mL

## 2020-11-04 MED ORDER — SODIUM CHLORIDE 0.9 % IV SOLN: 250.0000 mL | INTRAVENOUS | Status: DC | PRN

## 2020-11-04 SURGICAL SUPPLY — 32 items
BAG SNAP BAND KOVER 36X36 (MISCELLANEOUS) ×4 IMPLANT
BLANKET WARM UNDERBOD FULL ACC (MISCELLANEOUS) ×2 IMPLANT
CATH 29 EDWARDS DELIVERY SYS (CATHETERS) ×1 IMPLANT
CATH DIAG 6FR PIGTAIL ANGLED (CATHETERS) ×2 IMPLANT
CATH INFINITI 6F AL2 (CATHETERS) ×1 IMPLANT
CATH S G BIP PACING (CATHETERS) ×1 IMPLANT
CLOSURE MYNX CONTROL 6F/7F (Vascular Products) ×1 IMPLANT
CLOSURE PERCLOSE PROSTYLE (VASCULAR PRODUCTS) ×2 IMPLANT
CRIMPER (MISCELLANEOUS) ×1 IMPLANT
DEVICE INFLATION ATRION QL2530 (MISCELLANEOUS) ×1 IMPLANT
DEVICE INFLATION ATRION QL38 (MISCELLANEOUS) ×1 IMPLANT
ELECT DEFIB PAD ADLT CADENCE (PAD) ×1 IMPLANT
GUIDEWIRE CNFDA BRKR CVD (WIRE) ×1 IMPLANT
GUIDEWIRE SAFE TJ AMPLATZ EXST (WIRE) ×1 IMPLANT
KIT HEART LEFT (KITS) ×2 IMPLANT
KIT MICROPUNCTURE NIT STIFF (SHEATH) ×1 IMPLANT
PACK CARDIAC CATHETERIZATION (CUSTOM PROCEDURE TRAY) ×2 IMPLANT
SHEATH BRITE TIP 7FR 35CM (SHEATH) ×1 IMPLANT
SHEATH EDWARDS INTRO SET 16X36 (SHEATH) ×1 IMPLANT
SHEATH PINNACLE 6F 10CM (SHEATH) ×1 IMPLANT
SHEATH PINNACLE 8F 10CM (SHEATH) ×1 IMPLANT
SHIELD RADPAD SCOOP 12X17 (MISCELLANEOUS) ×1 IMPLANT
STOPCOCK MORSE 400PSI 3WAY (MISCELLANEOUS) ×4 IMPLANT
SYR MEDRAD MARK V 150ML (SYRINGE) ×1 IMPLANT
TRANSDUCER W/STOPCOCK (MISCELLANEOUS) ×4 IMPLANT
TUBING CONTRAST HIGH PRESS 48 (TUBING) ×1 IMPLANT
VALVE HEART TRANSCATH SZ3 29MM (Valve) ×1 IMPLANT
WIRE AMPLATZ SS-J .035X180CM (WIRE) ×1 IMPLANT
WIRE EMERALD 3MM-J .035X150CM (WIRE) ×1 IMPLANT
WIRE EMERALD 3MM-J .035X260CM (WIRE) ×1 IMPLANT
WIRE EMERALD ST .035X260CM (WIRE) ×1 IMPLANT
WIRE MICROINTRODUCER 60CM (WIRE) ×1 IMPLANT

## 2020-11-04 NOTE — Anesthesia Postprocedure Evaluation (Signed)
Anesthesia Post Note  Patient: [name].  Procedure(s) Performed: TRANSCATHETER AORTIC VALVE REPLACEMENT, TRANSFEMORAL (N/A ) TRANSESOPHAGEAL ECHOCARDIOGRAM (TEE) (N/A )     Patient location during evaluation: ICU Anesthesia Type: MAC Level of consciousness: awake Pain management: pain level controlled Vital Signs Assessment: post-procedure vital signs reviewed and stable Respiratory status: spontaneous breathing Cardiovascular status: stable Postop Assessment: no apparent nausea or vomiting Anesthetic complications: no   No complications documented.  Last Vitals:  Vitals:   11/04/20 1350 11/04/20 1355  BP: 120/68 113/70  Pulse: (!) 53 (!) 55  Resp: 14 15  Temp:    SpO2: 95% 95%    Last Pain:  Vitals:   11/04/20 1350  TempSrc:   PainSc: 0-No pain                 Kmya Placide

## 2020-11-04 NOTE — Progress Notes (Signed)
Mobility Specialist: Progress Note   11/04/20 1731  Mobility  Activity Ambulated in hall  Level of [place] wheel walker  Distance Ambulated (ft) 470 ft  Mobility Response Tolerated well  Mobility performed by Mobility specialist  $Mobility charge 1 Mobility   Pre-Mobility: 66 HR, 137/76 BP, 98% SpO2 Post-Mobility: 64 HR, 158/72 BP, 99% SpO2  Pt asx during ambulation. Pt back to bed after walk with RN present in room.   [facility] Seleny Allbright Mobility Specialist Mobility Specialist Phone: [phone]

## 2020-11-04 NOTE — Op Note (Signed)
[facility]ASC[facility]   MULTIDISCIPLINARY HEART VALVE TEAM   TAVR OPERATIVE NOTE   Date of Procedure:  11/04/2020  Preoperative Diagnosis: Severe Aortic Stenosis   Postoperative Diagnosis: Same   Procedure:    Transcatheter Aortic Valve Replacement - Percutaneous Right Transfemoral Approach  Edwards Sapien 3 THV (size 29 mm, model # 9600TFX, serial # [id number])   Co-Surgeons:  Valentina Gu. Roxy Manns, MD and Sherren Mocha, MD  Anesthesiologist:  Belinda Block, MD and Annye Asa, MD  Echocardiographer:  Sanda Klein, MD  Pre-operative Echo Findings:  Severe aortic stenosis  Normal left ventricular systolic function  Post-operative Echo Findings:  No paravalvular leak  Normal left ventricular systolic function   BRIEF CLINICAL NOTE AND INDICATIONS FOR SURGERY  Patient is an 87 year old moderately obese male with history of coronary artery disease status post coronary artery bypass grafting in 2007, aortic stenosis, hypertension, hyperlipidemia, paroxysmal atrial fibrillation not currently on anticoagulation, mini stroke, prostate cancer, and mild fusiform aneurysmal enlargement of the ascending thoracic aorta who has been referred for surgical consultation to discuss treatment options for management of severe symptomatic aortic stenosis.  Patient's cardiac history dates back to 2007 when he presented with symptomatic ischemic heart disease and underwent coronary artery bypass grafting x5.  Grafts placed at the time of surgery included left internal mammary artery to the distal left anterior descending coronary artery, sequential saphenous vein graft to the first diagonal and left circumflex, and sequential saphenous vein graft to the posterior descending and right posterior lateral branch.  He recovered uneventfully and has been followed intermittently ever since by Dr. Stanford Breed.  Previous Holter monitor showed very brief episode of atrial fibrillation.  Carotid duplex  scans revealed no significant cerebrovascular disease.  Nuclear stress test performed December 2020 revealed normal left ventricular function with no evidence for ischemia.  Echocardiograms have documented the presence of normal left ventricular function with aortic stenosis that has slowly progressed in severity.  Over the past 6 months or more the patient has developed progressive symptoms of exertional shortness of breath without any chest discomfort.  Symptoms now occur with moderate level activity and are always relieved by rest.  He denies any resting shortness of breath, PND, orthopnea, or lower extremity edema.  He has not had any chest pain or chest tightness either with activity or at rest.  Recent follow-up echocardiogram performed September 23, 2020 revealed normal left ventricular systolic function with severe aortic stenosis.  Peak velocity across aortic valve measured as high as 4.2 m/s corresponding to mean transvalvular gradient estimated 36 mmHg and aortic valve area calculated 0.82 cm by VTI.  The DVI was notably 0.26.  Left ventricular systolic function appeared normal with ejection fraction estimated 60 to 65%.  The patient was referred to the multidisciplinary heart valve clinic and underwent diagnostic cardiac catheterization by Dr. Burt Knack on October 01, 2020.  Catheterization revealed severe native coronary artery disease but continued patency of all 5 bypass grafts placed at the time of the patient's previous coronary artery bypass surgery.  Right heart pressures were normal.  CT angiography was performed and the patient was referred for surgical consultation.  During the course of the patient's preoperative work up they have been evaluated comprehensively by a multidisciplinary team of specialists coordinated through the [facility] in the [facility].  They have been demonstrated to suffer from symptomatic severe aortic stenosis as noted  above. The patient has been counseled extensively as to the relative  risks and benefits of all options for the treatment of severe aortic stenosis including long term medical therapy, conventional surgery for aortic valve replacement, and transcatheter aortic valve replacement.  All questions have been answered, and the patient provides full informed consent for the operation as described.   DETAILS OF THE OPERATIVE PROCEDURE  PREPARATION:    The patient is brought to the operating room on the above mentioned date and appropriate monitoring was established by the anesthesia team. The patient is placed in the supine position on the operating table.  Intravenous antibiotics are administered. The patient is monitored closely throughout the procedure under conscious sedation.  Baseline transthoracic echocardiogram was performed. The patient's chest, abdomen, both groins, and both lower extremities are prepared and draped in a sterile manner. A time out procedure is performed.   PERIPHERAL ACCESS:    Using the modified Seldinger technique, femoral arterial and venous access was obtained with placement of 6 Fr sheaths on the left side.  A pigtail diagnostic catheter was passed through the left arterial sheath under fluoroscopic guidance into the aortic root.  A temporary transvenous pacemaker catheter was passed through the left femoral venous sheath under fluoroscopic guidance into the right ventricle.  The pacemaker was tested to ensure stable lead placement and pacemaker capture. Aortic root angiography was performed in order to determine the optimal angiographic angle for valve deployment.   TRANSFEMORAL ACCESS:   Percutaneous transfemoral access and sheath placement was performed using ultrasound guidance.  The right common femoral artery was cannulated using a micropuncture needle and appropriate location was verified using hand injection angiogram.  A pair of Abbott Perclose percutaneous closure  devices were placed and a 6 French sheath replaced into the femoral artery.  The patient was heparinized systemically and ACT verified > 250 seconds.    A 16 Fr transfemoral E-sheath was introduced into the right common femoral artery after progressively dilating over an Amplatz superstiff wire. An AL-1 catheter was used to direct a straight-tip exchange length wire across the native aortic valve into the left ventricle. This was exchanged out for a pigtail catheter and position was confirmed in the LV apex. Simultaneous LV and Ao pressures were recorded.  The pigtail catheter was exchanged for an Amplatz Extra-stiff wire in the LV apex.  Echocardiography was utilized to confirm appropriate wire position and no sign of entanglement in the mitral subvalvular apparatus.   TRANSCATHETER HEART VALVE DEPLOYMENT:   An Edwards Sapien 3 transcatheter heart valve (size 29 mm, model #9600TFX, serial #[id number]) was prepared and crimped per manufacturer's guidelines, and the proper orientation of the valve is confirmed on the [employer] delivery system. The valve was advanced through the introducer sheath using normal technique until in an appropriate position in the abdominal aorta beyond the sheath tip. The balloon was then retracted and using the fine-tuning wheel was centered on the valve. The valve was then advanced across the aortic arch using appropriate flexion of the catheter. The valve was carefully positioned across the aortic valve annulus. The Commander catheter was retracted using normal technique. Once final position of the valve has been confirmed by angiographic assessment, the valve is deployed while temporarily holding ventilation and during rapid ventricular pacing to maintain systolic blood pressure < 50 mmHg and pulse pressure < 10 mmHg. The balloon inflation is held for >3 seconds after reaching full deployment volume. Once the balloon has fully deflated the balloon is retracted into the  ascending aorta and valve function is assessed using  echocardiography. There is felt to be no paravalvular leak and no central aortic insufficiency.  The patient's hemodynamic recovery following valve deployment is good.  The deployment balloon and guidewire are both removed.    PROCEDURE COMPLETION:   The sheath was removed and femoral artery closure performed.  Protamine was administered once femoral arterial repair was complete. The temporary pacemaker, pigtail catheters and femoral sheaths were removed with manual pressure used for hemostasis.  A Mynx femoral closure device was utilized following removal of the diagnostic sheath in the left femoral artery.  The patient tolerated the procedure well and is transported to the surgical intensive care in stable condition. There were no immediate intraoperative complications. All sponge instrument and needle counts are verified correct at completion of the operation.   No blood products were administered during the operation.  The patient received a total of 40 mL of intravenous contrast during the procedure.   Rexene Alberts, MD 11/04/2020 1:10 PM

## 2020-11-04 NOTE — Consult Note (Signed)
[facility]ASC[facility]   MULTIDISCIPLINARY HEART VALVE TEAM  Date:  11/04/2020   ID:  [name]., DOB [birth year], MRN [id number]  PCP:  Jacklynn Ganong, MD   CC: Shortness of breath   HISTORY OF PRESENT ILLNESS: [name]. is a 87 y.o. male with severe aortic stenosis, presenting for TAVR.  Patient has a history of coronary artery disease status post CABG in 2007, hypertension, mixed hyperlipidemia, and paroxysmal atrial fibrillation not currently on anticoagulation because of sleeping problems.  He has developed progressive and now severe symptomatic aortic stenosis.  Patient has undergone multidisciplinary evaluation today for TAVR.  Patient underwent multivessel CABG in 2007 when he presented with symptoms of unstable angina.  He was treated with 5 vessel bypass.  The patient describes a 55-month history of progressive shortness of breath and fatigue.  He has not had chest pain or pressure.  Symptoms are exertional and occur with any moderate level physical activity.  Symptoms resolve with rest.  He has not had symptoms of resting shortness of breath, chest pain, leg swelling, orthopnea, or PND.  Recent echo assessment demonstrated normal LV systolic function with LVEF 65%.  Peak transaortic velocity is 4.2 m/s, mean transvalvular gradient is 36 mmHg, and calculated aortic valve area 0.8 cm.  Cardiac catheterization demonstrated patency of the patient's bypass grafts.  Right heart pressures were normal.  CT angiography studies demonstrated suitable anatomy for transfemoral TAVR.   Past Medical History:  Diagnosis Date  . Arthritis    OA- lumbar  . Atrial fibrillation ([place])   . BPH (benign prostatic hypertrophy)   . BRADYCARDIA   . CAD    s/p CABG in 2007 x 5  . Carotid stenosis    0-39% bilaterally  . Complication of anesthesia    ? procaine allergy, pt. remarks that he had passing out surrounding Prostate biopsy   . Dyspnea   . Dysrhythmia   . Heart murmur   .  History of kidney stones 2010   at [facility] treated with cystoscopy  . HYPERCHOLESTEROLEMIA    statin intolerant  . HYPERTENSION   . Long term (current) use of anticoagulants   . NEPHROLITHIASIS   . Neuromuscular disorder (HCC)    numbness in fingers bilaterally  . PAF (paroxysmal atrial fibrillation) (HCC)    briefly noted on Holter monitor  . Prostate cancer ([place]) 2014   seed implant  . Severe aortic stenosis   . Skin cancer    sqaumous- top of head  . Stroke [facility])    mini stroke   . Thoracic ascending aortic aneurysm (HCC)    4.4 cm 08/21/18    Current Facility-Administered Medications  Medication Dose Route Frequency Provider Last Rate Last Admin  . [START ON 11/05/2020] 0.9 %  sodium chloride infusion   Intravenous Continuous Sherren Mocha, MD      . cefUROXime (ZINACEF) 1.5 g in sodium chloride 0.9 % 100 mL IVPB  1.5 g Intravenous To OR Sherren Mocha, MD      . chlorhexidine (HIBICLENS) 4 % liquid 2 application  30 mL Topical UD Sherren Mocha, MD      . chlorhexidine (HIBICLENS) 4 % liquid 4 application  60 mL Topical Once Sherren Mocha, MD       And  . Derrill Memo ON 11/05/2020] chlorhexidine (HIBICLENS) 4 % liquid 4 application  60 mL Topical Once Sherren Mocha, MD      . Derrill Memo ON 11/05/2020] chlorhexidine (PERIDEX) 0.12 % solution 15  mL  15 mL Mouth/Throat Once Sherren Mocha, MD      . dexmedetomidine (PRECEDEX) 400 MCG/100ML (4 mcg/mL) infusion  0.1-0.7 mcg/kg/hr Intravenous To OR Sherren Mocha, MD      . heparin 30,000 units/NS 1000 mL solution for CELLSAVER   Other To OR Sherren Mocha, MD      . lactated ringers infusion   Intravenous Continuous Ellender, Karyl Kinnier, MD 10 mL/hr at 11/04/20 0750 New Bag at 11/04/20 0750  . lactated ringers infusion   Intravenous Continuous Belinda Block, MD      . magnesium sulfate (IV Push/IM) injection 40 mEq  40 mEq Other To OR Sherren Mocha, MD      . norepinephrine (LEVOPHED) 4mg  in 274mL premix infusion  0-10 mcg/min Intravenous  To OR Sherren Mocha, MD      . potassium chloride injection 80 mEq  80 mEq Other To OR Sherren Mocha, MD      . vancomycin (VANCOREADY) IVPB 1500 mg/300 mL  1,500 mg Intravenous To OR Sherren Mocha, MD        ALLERGIES:   Metronidazole, Procaine, and Statins   SOCIAL HISTORY:  The patient  reports that he quit smoking about 51 years ago. His smoking use included cigarettes. He smoked 1.00 pack per day. He quit smokeless tobacco use about 32 years ago.  His smokeless tobacco use included chew. He reports that he does not drink alcohol and does not use drugs.   FAMILY HISTORY:  The patient's family history includes Heart attack in his father; Heart disease in his mother; Heart failure in his mother.   REVIEW OF SYSTEMS:  Positive for back pain, fatigue.   All other systems are reviewed and negative.   PHYSICAL EXAM: VS:  BP 139/87   Pulse 69   Temp 97.8 F (36.6 C) (Oral)   Resp 17   Ht 6\' 3"  (1.905 m)   Wt 115.2 kg   SpO2 96%   BMI 31.75 kg/m  , BMI Body mass index is 31.75 kg/m. GEN: Well nourished, well developed, elderly male in no acute distress HEENT: normal Neck: No JVD. carotids 2+ without bruits or masses Cardiac: The heart is RRR with 3/6 harsh late peaking crescendo decrescendo murmur at the right upper sternal border.  No edema. Pedal pulses 2+ = bilaterally  Respiratory:  clear to auscultation bilaterally GI: soft, nontender, nondistended, + BS MS: no deformity or atrophy Skin: warm and dry, no rash Neuro:  Strength and sensation are intact Psych: euthymic mood, full affect  EKG:  EKG from 10/31/2020 reviewed and demonstrates normal sinus rhythm with first-degree AV block, left axis deviation, incomplete right bundle branch block  RECENT LABS: 10/31/2020: ALT 19; BUN 25; Creatinine, Ser 1.90; Hemoglobin 16.0; Platelets 233; Potassium 4.0; Sodium 139  09/23/2020: Chol/HDL Ratio 6.1; Cholesterol, Total 238; HDL 39; LDL Chol Calc (NIH) 181; Triglycerides 100    Estimated Creatinine Clearance: 39.6 mL/min (A) (by C-G formula based on SCr of 1.9 mg/dL (H)).   Wt Readings from Last 3 Encounters:  11/04/20 115.2 kg  10/31/20 115.6 kg  10/13/20 116 kg     CARDIAC STUDIES:  Echo:  IMPRESSIONS    1. Sigmoid septum. Left ventricular ejection fraction, by estimation, is  60 to 65%. The left ventricle has normal function. The left ventricle has  no regional wall motion abnormalities. There is moderate left ventricular  hypertrophy. Left ventricular  diastolic parameters are consistent with Grade I diastolic dysfunction  (impaired relaxation).  2. Right  ventricular systolic function is normal. The right ventricular  size is normal.  3. Left atrial size was mildly dilated.  4. The mitral valve is normal in structure. No evidence of mitral valve  regurgitation. No evidence of mitral stenosis.  5. The aortic valve is normal in structure. There is moderate  calcification of the aortic valve. There is moderate thickening of the  aortic valve. Aortic valve regurgitation is mild. Severe aortic valve  stenosis. Aortic valve area, by VTI measures 0.82  cm. Aortic valve mean gradient measures 36.0 mmHg.  6. Aneurysm of the ascending aorta, measuring 45 mm.  7. The inferior vena cava is normal in size with greater than 50%  respiratory variability, suggesting right atrial pressure of 3 mmHg.    Cardiac Cath:  Conclusion    Ost RCA to Prox RCA lesion is 80% stenosed.  Mid RCA lesion is 90% stenosed.  Prox LAD lesion is 100% stenosed.  LIMA graft was visualized by angiography and is normal in caliber.  The graft exhibits no disease.  Seq SVG- PDA/PLA graft was visualized by angiography.  Origin to Prox Graft lesion before 1st RPL is 50% stenosed.  Mid LM to Dist LM lesion is 80% stenosed.  Ost Cx to Prox Cx lesion is 100% stenosed.  Seq SVG- Diagonal and OM1 and is normal in caliber.  The graft exhibits no disease.  There  is severe aortic valve stenosis.   1.  Severe native vessel coronary artery disease with severe stenosis of the RCA, left main, LAD, and occlusion of the circumflex 2.  Status post aortocoronary bypass surgery with continued patency of the LIMA to LAD, patency of the sequential saphenous vein graft to first diagonal and first OM, and patency of the sequential saphenous vein graft to the right PDA and PLA branches.  There appears to be moderate stenosis of the saphenous vein graft to right coronary artery with subselective visualization. 3.  Known severe aortic stenosis with heavy calcification of the aortic valve on plain fluoroscopy 4.  Normal right heart pressures  Recommend: Multidisciplinary evaluation for TAVR.  Okay to proceed with CT angiography studies using renal protocol followed by surgical consultation with Dr. Roxy Manns  CTA 10/10/2020: FINDINGS: Aortic Valve: Moderately thickened tricuspid aortic valve with moderate calcification. The planimeter valve area is 3.18 Sq cm which is not consistent with severe aortic stenosis  Number of leaflets: 3  LVOT calcification: Minimal  Annular calcification: Mild  Aortic Valve Calcium Score 1400.  Aortic Annulus Measurements  Major annulus diameter: 33 mm  Minor annulus diameter: 22 mm  Annular perimeter: 89 mm  Annular area: 5.64 cm2  Aortic Root Measurements- Diastole  Sinus of Valsalva perimeter: 112 mm  Sinotubular Junction: 37 mm  Ascending Thoracic Aorta: Mild aneurysmal dilatation of the ascending thoracic aorta: 45 mm.  Aortic Arch: 31 mm; aortic atherosclerosis noted.  Descending Thoracic Aorta: 34 mm; aortic atherosclerosis noted.  Sinus of Valsalva Measurements:  Right coronary cusp width: 33 mm  Left-coronary cusp width: 35 mm  Non- coronary cusp width: 34 mm  Coronary Artery Height above Annulus:  Left Main: 15 mm  Right Coronary: 14 mm  Coronary Arteries: Calcified native  coronary arteries with bypass grafting noted.  Coronary Artery Calcium score: Deferred in the setting of known CAD and prior bypass.  Optimum Fluoroscopic Angle for Delivery: 3 Cusp-traditional View: LAO 18, CAU 5. Anterior View: RAO 0, Cau 25.  Valves for heart team consideration: 29 mm SAPIEN 3; 31 mm CoreValve  system.  IMPRESSION: 1. Aortic stenosis; CINE, calcium score, and planimetry less consistent with severe aortic stenosis. Findings pertinent to TAVR procedure are detailed above.  2. Coronary Arteries: Calcified native coronary arteries with bypass grafting noted.  3.  Moderate ascending aortic aneurysm noted: 45 mm.  4.  Aortic atherosclerosis noted.  CTA Chest/Abd/Pelvis: AORTA:  Minimal Aortic Diameter-17 x 16 mm  Severity of Aortic Calcification-severe  RIGHT PELVIS:  Right Common Iliac Artery -  Minimal Diameter-12.3 x 10.1 mm  Tortuosity-mild  Calcification-moderate to severe  Right External Iliac Artery -  Minimal Diameter-9.4 x 8.6 mm  Tortuosity-severe  Calcification-moderate  Right Common Femoral Artery -  Minimal Diameter-9.6 x 9.3 mm  Tortuosity-mild  Calcification-moderate to severe  LEFT PELVIS:  Left Common Iliac Artery -  Minimal Diameter-10.8 x 10.8 mm  Tortuosity-mild  Calcification-moderate to severe  Left External Iliac Artery -  Minimal Diameter-10.4 x 8.8 mm  Tortuosity-severe  Calcification-mild-to-moderate  Left Common Femoral Artery -  Minimal Diameter-9.0 x 7.8 mm  Tortuosity-mild  Calcification-moderate to severe  Review of the MIP images confirms the above findings.  IMPRESSION: 1. Vascular findings and measurements pertinent to potential TAVR procedure, as detailed above. 2. Severe thickening calcification of the aortic valve, compatible with reported clinical history of severe aortic stenosis. 3. Aortic atherosclerosis, in addition to left main and 3  vessel coronary artery disease. Status post median sternotomy for CABG including LIMA to the LAD. 4. Mild aneurysmal dilatation of the ascending thoracic aorta (4.5 cm in diameter). Ascending thoracic aortic aneurysm. Recommend semi-annual imaging followup by CTA or MRA and referral to cardiothoracic surgery if not already obtained.  ASSESSMENT AND PLAN: 56.  87 year old gentleman with severe, stage D1 Aortic stenosis.  This is associated with New York Heart Association functional class II symptoms of chronic diastolic heart failure/exertional dyspnea.  All of the patient's preoperative studies have been reviewed extensively with the multidisciplinary heart team.  He has continued patency of all coronary bypass grafts, preserved LV function, CTA findings demonstrate appropriate anatomy for the delivery of a 29 mm Edwards SAPIEN 3 valve via transfemoral access.  Risks, indications, and alternatives have been reviewed with the patient and his family.  Please see Dr. Guy Sandifer complete note for specific discussion.  All questions are answered.  Informed consent for the procedure is obtained.  Deatra James 11/04/2020 8:18 AM     [place] [address] [place] [address]  [phone] (office) [phone] (fax)

## 2020-11-04 NOTE — Transfer of Care (Signed)
Immediate Anesthesia Transfer of Care Note  Patient: [name].  Procedure(s) Performed: TRANSCATHETER AORTIC VALVE REPLACEMENT, TRANSFEMORAL (N/A ) TRANSESOPHAGEAL ECHOCARDIOGRAM (TEE) (N/A )  Patient Location: Cath Lab  Anesthesia Type:MAC  Level of Consciousness: awake, alert  and oriented  Airway & Oxygen Therapy: Patient Spontanous Breathing  Post-op Assessment: Report given to RN and Post -op Vital signs reviewed and stable  Post vital signs: Reviewed and stable  Last Vitals:  Vitals Value Taken Time  BP 103/63 11/04/20 1220  Temp    Pulse 63 11/04/20 1221  Resp 21 11/04/20 1221  SpO2 93 % 11/04/20 1221  Vitals shown include unvalidated device data.  Last Pain:  Vitals:   11/04/20 0738  TempSrc:   PainSc: 0-No pain      Patients Stated Pain Goal: 2 (90/30/09 2330)  Complications: No complications documented.

## 2020-11-04 NOTE — Progress Notes (Signed)
Echocardiogram 2D Echocardiogram limited  has been performed.  Darlina Sicilian M 11/04/2020, 12:48 PM

## 2020-11-04 NOTE — Op Note (Signed)
[facility]   MULTIDISCIPLINARY HEART VALVE TEAM   TAVR OPERATIVE NOTE   Date of Procedure:  11/04/2020  Preoperative Diagnosis: Severe Aortic Stenosis   Postoperative Diagnosis: Same   Procedure:    Transcatheter Aortic Valve Replacement - Percutaneous  Transfemoral Approach  Edwards Sapien 3 THV (size 29 mm, model # 9600TFX, serial # [id number])   Co-Surgeons:  Valentina Gu. Roxy Manns, MD and Sherren Mocha, MD  Anesthesiologist:  Belinda Block and Annye Asa, MD  Echocardiographer:  Sanda Klein, MD  Pre-operative Echo Findings:  Severe aortic stenosis  Normal left ventricular systolic function  Post-operative Echo Findings:  Trace paravalvular leak  Normal left ventricular systolic function  BRIEF CLINICAL NOTE AND INDICATIONS FOR SURGERY  87 year old gentleman with CAD status post remote CABG in 2007, paroxysmal atrial fibrillation not on anticoagulation, prostate cancer, hypertension, and mixed hyperlipidemia.  The patient has developed progressive and now severe symptomatic aortic stenosis.  He has undergone extensive testing and multidisciplinary heart team review and presents today for planned transfemoral TAVR.  During the course of the patient's preoperative work up they have been evaluated comprehensively by a multidisciplinary team of specialists coordinated through the [facility] in the [facility].  They have been demonstrated to suffer from symptomatic severe aortic stenosis as noted above. The patient has been counseled extensively as to the relative risks and benefits of all options for the treatment of severe aortic stenosis including long term medical therapy, conventional surgery for aortic valve replacement, and transcatheter aortic valve replacement.  The patient has been independently evaluated in formal cardiac surgical consultation by Dr Roxy Manns, who deemed the patient appropriate for  TAVR. Based upon review of all of the patient's preoperative diagnostic tests they are felt to be candidate for transcatheter aortic valve replacement using the transfemoral approach as an alternative to conventional surgery.    Following the decision to proceed with transcatheter aortic valve replacement, a discussion has been held regarding what types of management strategies would be attempted intraoperatively in the event of life-threatening complications, including whether or not the patient would be considered a candidate for the use of cardiopulmonary bypass and/or conversion to open sternotomy for attempted surgical intervention.  The patient has been advised of a variety of complications that might develop peculiar to this approach including but not limited to risks of death, stroke, paravalvular leak, aortic dissection or other major vascular complications, aortic annulus rupture, device embolization, cardiac rupture or perforation, acute myocardial infarction, arrhythmia, heart block or bradycardia requiring permanent pacemaker placement, congestive heart failure, respiratory failure, renal failure, pneumonia, infection, other late complications related to structural valve deterioration or migration, or other complications that might ultimately cause a temporary or permanent loss of functional independence or other long term morbidity.  The patient provides full informed consent for the procedure as described and all questions were answered preoperatively.  DETAILS OF THE OPERATIVE PROCEDURE  PREPARATION:   The patient is brought to the operating room on the above mentioned date and central monitoring was established by the anesthesia team including placement of a central venous catheter and radial arterial line. The patient is placed in the supine position on the operating table.  Intravenous antibiotics are administered. The patient is monitored closely throughout the procedure under conscious  sedation.  Baseline transthoracic echocardiogram is performed. The patient's chest, abdomen, both groins, and both lower extremities are prepared and draped in a sterile manner. A time out procedure is performed.  PERIPHERAL ACCESS:   Using ultrasound guidance, femoral arterial and venous access is obtained with placement of 6 Fr sheaths on the left side.  A pigtail diagnostic catheter was passed through the femoral arterial sheath under fluoroscopic guidance into the aortic root.  A temporary transvenous pacemaker catheter was passed through the femoral venous sheath under fluoroscopic guidance into the right ventricle.  The pacemaker was tested to ensure stable lead placement and pacemaker capture. Aortic root angiography was performed in order to determine the optimal angiographic angle for valve deployment.  TRANSFEMORAL ACCESS:  A micropuncture technique is used to access the right femoral artery under fluoroscopic and ultrasound guidance.  2 Perclose devices are deployed at 10' and 2' positions to 'PreClose' the femoral artery. An 8 French sheath is placed and then an Amplatz Superstiff wire is advanced through the sheath.  Because of severe iliac tortuosity, an AL-2 catheter is used to direct the Amplatz Super Stiff wire into the thoracic aorta.  This is changed out for a 14 French transfemoral E-Sheath after progressively dilating over the Superstiff wire.  An AL-2 catheter was used to direct a straight-tip exchange length wire across the native aortic valve into the left ventricle. This was exchanged out for a pigtail catheter and position was confirmed in the LV apex. Simultaneous LV and Ao pressures were recorded.  The pigtail catheter was exchanged for a Confida wire in the LV apex.    BALLOON AORTIC VALVULOPLASTY:  Not performed  TRANSCATHETER HEART VALVE DEPLOYMENT:  An Edwards Sapien 3 transcatheter heart valve (size 29 mm) was prepared and crimped per manufacturer's guidelines, and  the proper orientation of the valve is confirmed on the [employer] delivery system. The valve was advanced through the introducer sheath using normal technique until in an appropriate position in the abdominal aorta beyond the sheath tip. The balloon was then retracted and using the fine-tuning wheel was centered on the valve. The valve was then advanced across the aortic arch using appropriate flexion of the catheter. The valve was carefully positioned across the aortic valve annulus. The Commander catheter was retracted using normal technique. Once final position of the valve has been confirmed by angiographic assessment, the valve is deployed while temporarily holding ventilation and during rapid ventricular pacing to maintain systolic blood pressure < 50 mmHg and pulse pressure < 10 mmHg. The balloon inflation is held for >3 seconds after reaching full deployment volume. Once the balloon has fully deflated the balloon is retracted into the ascending aorta and valve function is assessed using echocardiography. The patient's hemodynamic recovery following valve deployment is good.  The deployment balloon and guidewire are both removed. Echo demostrated acceptable post-procedural gradients, stable mitral valve function, and trace aortic insufficiency.    PROCEDURE COMPLETION:  The sheath was removed and femoral artery closure is performed using the 2 previously deployed Perclose devices.  Protamine is administered once femoral arterial repair was complete. The site is clear with no evidence of bleeding or hematoma after the sutures are tightened. The temporary pacemaker and pigtail catheters are removed. Mynx closure  is used for contralateral femoral arterial hemostasis for the 6 Fr sheath.  The patient tolerated the procedure well and is transported to the surgical intensive care in stable condition. There were no immediate intraoperative complications. All sponge instrument and needle counts are  verified correct at completion of the operation.   The patient received a total of 40 mL of intravenous contrast during the procedure.   Sherren Mocha, MD  11/04/2020 4:47 PM

## 2020-11-04 NOTE — H&P (View-Only) (Signed)
[facility]   MULTIDISCIPLINARY HEART VALVE TEAM  Date:  11/04/2020   ID:  Sergio Foley., DOB 02/07/1934, MRN [id number]  PCP:  Jacklynn Ganong, MD   CC: Shortness of breath   HISTORY OF PRESENT ILLNESS: Sergio Koren. is a 87 y.o. male with severe aortic stenosis, presenting for TAVR.  Patient has a history of coronary artery disease status post CABG in 2007, hypertension, mixed hyperlipidemia, and paroxysmal atrial fibrillation not currently on anticoagulation because of sleeping problems.  He has developed progressive and now severe symptomatic aortic stenosis.  Patient has undergone multidisciplinary evaluation today for TAVR.  Patient underwent multivessel CABG in 2007 when he presented with symptoms of unstable angina.  He was treated with 5 vessel bypass.  The patient describes a 35-month history of progressive shortness of breath and fatigue.  He has not had chest pain or pressure.  Symptoms are exertional and occur with any moderate level physical activity.  Symptoms resolve with rest.  He has not had symptoms of resting shortness of breath, chest pain, leg swelling, orthopnea, or PND.  Recent echo assessment demonstrated normal LV systolic function with LVEF 65%.  Peak transaortic velocity is 4.2 m/s, mean transvalvular gradient is 36 mmHg, and calculated aortic valve area 0.8 cm.  Cardiac catheterization demonstrated patency of the patient's bypass grafts.  Right heart pressures were normal.  CT angiography studies demonstrated suitable anatomy for transfemoral TAVR.   Past Medical History:  Diagnosis Date  . Arthritis    OA- lumbar  . Atrial fibrillation ([place])   . BPH (benign prostatic hypertrophy)   . BRADYCARDIA   . CAD    s/p CABG in 2007 x 5  . Carotid stenosis    0-39% bilaterally  . Complication of anesthesia    ? procaine allergy, pt. remarks that he had passing out surrounding Prostate biopsy   . Dyspnea   . Dysrhythmia   . Heart murmur   .  History of kidney stones 2010   [facility] with cystoscopy  . HYPERCHOLESTEROLEMIA    statin intolerant  . HYPERTENSION   . Long term (current) use of anticoagulants   . NEPHROLITHIASIS   . Neuromuscular disorder (HCC)    numbness in fingers bilaterally  . PAF (paroxysmal atrial fibrillation) (HCC)    briefly noted on Holter monitor  . Prostate cancer ([place]) 2014   seed implant  . Severe aortic stenosis   . Skin cancer    sqaumous- top of head  . Stroke [facility])    mini stroke   . Thoracic ascending aortic aneurysm ([facility])    4.4 cm 08/21/18    Current Facility-Administered Medications  Medication Dose Route Frequency Provider Last Rate Last Admin  . [START ON 11/05/2020] 0.9 %  sodium chloride infusion   Intravenous Continuous Sherren Mocha, MD      . cefUROXime (ZINACEF) 1.5 g in sodium chloride 0.9 % 100 mL IVPB  1.5 g Intravenous To OR Sherren Mocha, MD      . chlorhexidine (HIBICLENS) 4 % liquid 2 application  30 mL Topical UD Sherren Mocha, MD      . chlorhexidine (HIBICLENS) 4 % liquid 4 application  60 mL Topical Once Sherren Mocha, MD       And  . Derrill Memo ON 11/05/2020] chlorhexidine (HIBICLENS) 4 % liquid 4 application  60 mL Topical Once Sherren Mocha, MD      . Derrill Memo ON 11/05/2020] chlorhexidine (PERIDEX) 0.12 % solution 15  mL  15 mL Mouth/Throat Once Sherren Mocha, MD      . dexmedetomidine (PRECEDEX) 400 MCG/100ML (4 mcg/mL) infusion  0.1-0.7 mcg/kg/hr Intravenous To OR Sherren Mocha, MD      . heparin 30,000 units/NS 1000 mL solution for CELLSAVER   Other To OR Sherren Mocha, MD      . lactated ringers infusion   Intravenous Continuous Ellender, Karyl Kinnier, MD 10 mL/hr at 11/04/20 0750 New Bag at 11/04/20 0750  . lactated ringers infusion   Intravenous Continuous Belinda Block, MD      . magnesium sulfate (IV Push/IM) injection 40 mEq  40 mEq Other To OR Sherren Mocha, MD      . norepinephrine (LEVOPHED) 4mg  in 239mL premix infusion  0-10 mcg/min Intravenous  To OR Sherren Mocha, MD      . potassium chloride injection 80 mEq  80 mEq Other To OR Sherren Mocha, MD      . vancomycin (VANCOREADY) IVPB 1500 mg/300 mL  1,500 mg Intravenous To OR Sherren Mocha, MD        ALLERGIES:   Metronidazole, Procaine, and Statins   SOCIAL HISTORY:  The patient  reports that he quit smoking about 51 years ago. His smoking use included cigarettes. He smoked 1.00 pack per day. He quit smokeless tobacco use about 32 years ago.  His smokeless tobacco use included chew. He reports that he does not drink alcohol and does not use drugs.   FAMILY HISTORY:  The patient's family history includes Heart attack in his father; Heart disease in his mother; Heart failure in his mother.   REVIEW OF SYSTEMS:  Positive for back pain, fatigue.   All other systems are reviewed and negative.   PHYSICAL EXAM: VS:  BP 139/87   Pulse 69   Temp 97.8 F (36.6 C) (Oral)   Resp 17   Ht 6\' 3"  (1.905 m)   Wt 115.2 kg   SpO2 96%   BMI 31.75 kg/m  , BMI Body mass index is 31.75 kg/m. GEN: Well nourished, well developed, elderly male in no acute distress HEENT: normal Neck: No JVD. carotids 2+ without bruits or masses Cardiac: The heart is RRR with 3/6 harsh late peaking crescendo decrescendo murmur at the right upper sternal border.  No edema. Pedal pulses 2+ = bilaterally  Respiratory:  clear to auscultation bilaterally GI: soft, nontender, nondistended, + BS MS: no deformity or atrophy Skin: warm and dry, no rash Neuro:  Strength and sensation are intact Psych: euthymic mood, full affect  EKG:  EKG from 10/31/2020 reviewed and demonstrates normal sinus rhythm with first-degree AV block, left axis deviation, incomplete right bundle branch block  RECENT LABS: 10/31/2020: ALT 19; BUN 25; Creatinine, Ser 1.90; Hemoglobin 16.0; Platelets 233; Potassium 4.0; Sodium 139  09/23/2020: Chol/HDL Ratio 6.1; Cholesterol, Total 238; HDL 39; LDL Chol Calc (NIH) 181; Triglycerides 100    Estimated Creatinine Clearance: 39.6 mL/min (A) (by C-G formula based on SCr of 1.9 mg/dL (H)).   Wt Readings from Last 3 Encounters:  11/04/20 115.2 kg  10/31/20 115.6 kg  10/13/20 116 kg     CARDIAC STUDIES:  Echo:  IMPRESSIONS    1. Sigmoid septum. Left ventricular ejection fraction, by estimation, is  60 to 65%. The left ventricle has normal function. The left ventricle has  no regional wall motion abnormalities. There is moderate left ventricular  hypertrophy. Left ventricular  diastolic parameters are consistent with Grade I diastolic dysfunction  (impaired relaxation).  2. Right  ventricular systolic function is normal. The right ventricular  size is normal.  3. Left atrial size was mildly dilated.  4. The mitral valve is normal in structure. No evidence of mitral valve  regurgitation. No evidence of mitral stenosis.  5. The aortic valve is normal in structure. There is moderate  calcification of the aortic valve. There is moderate thickening of the  aortic valve. Aortic valve regurgitation is mild. Severe aortic valve  stenosis. Aortic valve area, by VTI measures 0.82  cm. Aortic valve mean gradient measures 36.0 mmHg.  6. Aneurysm of the ascending aorta, measuring 45 mm.  7. The inferior vena cava is normal in size with greater than 50%  respiratory variability, suggesting right atrial pressure of 3 mmHg.    Cardiac Cath:  Conclusion    Ost RCA to Prox RCA lesion is 80% stenosed.  Mid RCA lesion is 90% stenosed.  Prox LAD lesion is 100% stenosed.  LIMA graft was visualized by angiography and is normal in caliber.  The graft exhibits no disease.  Seq SVG- PDA/PLA graft was visualized by angiography.  Origin to Prox Graft lesion before 1st RPL is 50% stenosed.  Mid LM to Dist LM lesion is 80% stenosed.  Ost Cx to Prox Cx lesion is 100% stenosed.  Seq SVG- Diagonal and OM1 and is normal in caliber.  The graft exhibits no disease.  There  is severe aortic valve stenosis.   1.  Severe native vessel coronary artery disease with severe stenosis of the RCA, left main, LAD, and occlusion of the circumflex 2.  Status post aortocoronary bypass surgery with continued patency of the LIMA to LAD, patency of the sequential saphenous vein graft to first diagonal and first OM, and patency of the sequential saphenous vein graft to the right PDA and PLA branches.  There appears to be moderate stenosis of the saphenous vein graft to right coronary artery with subselective visualization. 3.  Known severe aortic stenosis with heavy calcification of the aortic valve on plain fluoroscopy 4.  Normal right heart pressures  Recommend: Multidisciplinary evaluation for TAVR.  Okay to proceed with CT angiography studies using renal protocol followed by surgical consultation with Dr. Roxy Manns  CTA 10/10/2020: FINDINGS: Aortic Valve: Moderately thickened tricuspid aortic valve with moderate calcification. The planimeter valve area is 3.18 Sq cm which is not consistent with severe aortic stenosis  Number of leaflets: 3  LVOT calcification: Minimal  Annular calcification: Mild  Aortic Valve Calcium Score 1400.  Aortic Annulus Measurements  Major annulus diameter: 33 mm  Minor annulus diameter: 22 mm  Annular perimeter: 89 mm  Annular area: 5.64 cm2  Aortic Root Measurements- Diastole  Sinus of Valsalva perimeter: 112 mm  Sinotubular Junction: 37 mm  Ascending Thoracic Aorta: Mild aneurysmal dilatation of the ascending thoracic aorta: 45 mm.  Aortic Arch: 31 mm; aortic atherosclerosis noted.  Descending Thoracic Aorta: 34 mm; aortic atherosclerosis noted.  Sinus of Valsalva Measurements:  Right coronary cusp width: 33 mm  Left-coronary cusp width: 35 mm  Non- coronary cusp width: 34 mm  Coronary Artery Height above Annulus:  Left Main: 15 mm  Right Coronary: 14 mm  Coronary Arteries: Calcified native  coronary arteries with bypass grafting noted.  Coronary Artery Calcium score: Deferred in the setting of known CAD and prior bypass.  Optimum Fluoroscopic Angle for Delivery: 3 Cusp-traditional View: LAO 18, CAU 5. Anterior View: RAO 0, Cau 25.  Valves for heart team consideration: 29 mm SAPIEN 3; 31 mm CoreValve  system.  IMPRESSION: 1. Aortic stenosis; CINE, calcium score, and planimetry less consistent with severe aortic stenosis. Findings pertinent to TAVR procedure are detailed above.  2. Coronary Arteries: Calcified native coronary arteries with bypass grafting noted.  3.  Moderate ascending aortic aneurysm noted: 45 mm.  4.  Aortic atherosclerosis noted.  CTA Chest/Abd/Pelvis: AORTA:  Minimal Aortic Diameter-17 x 16 mm  Severity of Aortic Calcification-severe  RIGHT PELVIS:  Right Common Iliac Artery -  Minimal Diameter-12.3 x 10.1 mm  Tortuosity-mild  Calcification-moderate to severe  Right External Iliac Artery -  Minimal Diameter-9.4 x 8.6 mm  Tortuosity-severe  Calcification-moderate  Right Common Femoral Artery -  Minimal Diameter-9.6 x 9.3 mm  Tortuosity-mild  Calcification-moderate to severe  LEFT PELVIS:  Left Common Iliac Artery -  Minimal Diameter-10.8 x 10.8 mm  Tortuosity-mild  Calcification-moderate to severe  Left External Iliac Artery -  Minimal Diameter-10.4 x 8.8 mm  Tortuosity-severe  Calcification-mild-to-moderate  Left Common Femoral Artery -  Minimal Diameter-9.0 x 7.8 mm  Tortuosity-mild  Calcification-moderate to severe  Review of the MIP images confirms the above findings.  IMPRESSION: 1. Vascular findings and measurements pertinent to potential TAVR procedure, as detailed above. 2. Severe thickening calcification of the aortic valve, compatible with reported clinical history of severe aortic stenosis. 3. Aortic atherosclerosis, in addition to left main and 3  vessel coronary artery disease. Status post median sternotomy for CABG including LIMA to the LAD. 4. Mild aneurysmal dilatation of the ascending thoracic aorta (4.5 cm in diameter). Ascending thoracic aortic aneurysm. Recommend semi-annual imaging followup by CTA or MRA and referral to cardiothoracic surgery if not already obtained.  ASSESSMENT AND PLAN: 59.  87 year old gentleman with severe, stage D1 Aortic stenosis.  This is associated with New York Heart Association functional class II symptoms of chronic diastolic heart failure/exertional dyspnea.  All of the patient's preoperative studies have been reviewed extensively with the multidisciplinary heart team.  He has continued patency of all coronary bypass grafts, preserved LV function, CTA findings demonstrate appropriate anatomy for the delivery of a 29 mm Edwards SAPIEN 3 valve via transfemoral access.  Risks, indications, and alternatives have been reviewed with the patient and his family.  Please see Dr. Guy Sandifer complete note for specific discussion.  All questions are answered.  Informed consent for the procedure is obtained.  [name] 11/04/2020 8:18 AM     [place] [address] [place] [address]  [phone] (office) [phone] (fax)

## 2020-11-04 NOTE — Interval H&P Note (Signed)
History and Physical Interval Note:  11/04/2020 1:19 PM  [name].  has presented today for surgery, with the diagnosis of Severe Aortic Stenosis.  The various methods of treatment have been discussed with the patient and family. After consideration of risks, benefits and other options for treatment, the patient has consented to  Procedure(s): TRANSCATHETER AORTIC VALVE REPLACEMENT, TRANSFEMORAL (N/A) TRANSESOPHAGEAL ECHOCARDIOGRAM (TEE) (N/A) as a surgical intervention.  The patient's history has been reviewed, patient examined, no change in status, stable for surgery.  I have reviewed the patient's chart and labs.  Questions were answered to the patient's satisfaction.     Sherren Mocha

## 2020-11-04 NOTE — Progress Notes (Signed)
Pt received from cath lab. VSS. Bilateral groin level 0. CHG complete. Pt and family oriented to room and unit. Call light in reach.  Clyde Canterbury, RN

## 2020-11-04 NOTE — Anesthesia Procedure Notes (Signed)
Procedure Name: MAC Date/Time: 11/04/2020 10:30 AM Performed by: Griffin Dakin, CRNA Pre-anesthesia Checklist: Patient identified, Emergency Drugs available, Suction available, Patient being monitored and Timeout performed Patient Re-evaluated:Patient Re-evaluated prior to induction Oxygen Delivery Method: Simple face mask Induction Type: IV induction Placement Confirmation: positive ETCO2 and breath sounds checked- equal and bilateral Dental Injury: Teeth and Oropharynx as per pre-operative assessment

## 2020-11-04 NOTE — Discharge Instructions (Signed)
ACTIVITY AND EXERCISE °• Daily activity and exercise are an important part of your recovery. People recover at different rates depending on their general health and type of valve procedure. °• Most people recovering from TAVR feel better relatively quickly  °• No lifting, pushing, pulling more than 10 pounds (examples to avoid: groceries, vacuuming, gardening, golfing): °            - For one week with a procedure through the groin. °            - For six weeks for procedures through the chest wall or neck. °NOTE: You will typically see one of our providers 7-14 days after your procedure to discuss WHEN TO RESUME the above activities.  °  °  °DRIVING °• Do not drive until you are seen for follow up and cleared by a provider. Generally, we ask patient to not drive for 1 week after their procedure. °• If you have been told by your doctor in the past that you may not drive, you must talk with him/her before you begin driving again. °  °DRESSING °• Groin site: you may leave the clear dressing over the site for up to one week or until it falls off. °  °HYGIENE °• If you had a femoral (leg) procedure, you may take a shower when you return home. After the shower, pat the site dry. Do NOT use powder, oils or lotions in your groin area until the site has completely healed. °• If you had a chest procedure, you may shower when you return home unless specifically instructed not to by your discharging practitioner. °            - DO NOT scrub incision; pat dry with a towel. °            - DO NOT apply any lotions, oils, powders to the incision. °            - No tub baths / swimming for at least 2 weeks. °• If you notice any fevers, chills, increased pain, swelling, bleeding or pus, please contact your doctor. °  °ADDITIONAL INFORMATION °• If you are going to have an upcoming dental procedure, please contact our office as you will require antibiotics ahead of time to prevent infection on your heart valve.  ° ° °If you have any  questions or concerns you can call the structural heart phone during normal business hours 8am-4pm. If you have an urgent need after hours or weekends please call [phone] to talk to the on call provider for general cardiology. If you have an emergency that requires immediate attention, please call 911.  ° ° °After TAVR Checklist ° °Check  Test Description  ° Follow up appointment in 1-2 weeks  You will see our structural heart physician assistant, Katie Deshanti Adcox. Your incision sites will be checked and you will be cleared to drive and resume all normal activities if you are doing well.    ° 1 month echo and follow up  You will have an echo to check on your new heart valve and be seen back in the office by Katie Soniya Ashraf. Many times the echo is not read by your appointment time, but Katie will call you later that day or the following day to report your results.  ° Follow up with your primary cardiologist You will need to be seen by your primary cardiologist in the following 3-6 months after your 1 month appointment in the valve   clinic. Often times your Plavix or Aspirin will be discontinued during this time, but this is decided on a case by case basis.   ° 1 year echo and follow up You will have another echo to check on your heart valve after 1 year and be seen back in the office by Katie Marissa Lowrey. This your last structural heart visit.  ° Bacterial endocarditis prophylaxis  You will have to take antibiotics for the rest of your life before all dental procedures (even teeth cleanings) to protect your heart valve. Antibiotics are also required before some surgeries. Please check with your cardiologist before scheduling any surgeries. Also, please make sure to tell us if you have a penicillin allergy as you will require an alternative antibiotic.   ° ° °

## 2020-11-05 ENCOUNTER — Inpatient Hospital Stay (HOSPITAL_COMMUNITY): Payer: Medicare Other

## 2020-11-05 ENCOUNTER — Encounter (HOSPITAL_COMMUNITY): Payer: Self-pay | Admitting: Cardiovascular Disease

## 2020-11-05 DIAGNOSIS — Z952 Presence of prosthetic heart valve: Secondary | ICD-10-CM

## 2020-11-05 DIAGNOSIS — I2510 Atherosclerotic heart disease of native coronary artery without angina pectoris: Secondary | ICD-10-CM | POA: Diagnosis not present

## 2020-11-05 DIAGNOSIS — Z006 Encounter for examination for normal comparison and control in clinical research program: Secondary | ICD-10-CM | POA: Diagnosis not present

## 2020-11-05 DIAGNOSIS — I350 Nonrheumatic aortic (valve) stenosis: Secondary | ICD-10-CM | POA: Diagnosis not present

## 2020-11-05 DIAGNOSIS — I700 Atherosclerosis of aorta: Secondary | ICD-10-CM | POA: Diagnosis not present

## 2020-11-05 LAB — BASIC METABOLIC PANEL
Anion gap: 4 — ABNORMAL LOW (ref 5–15)
BUN: 26 mg/dL — ABNORMAL HIGH (ref 8–23)
CO2: 29 mmol/L (ref 22–32)
Calcium: 8.6 mg/dL — ABNORMAL LOW (ref 8.9–10.3)
Chloride: 106 mmol/L (ref 98–111)
Creatinine, Ser: 1.78 mg/dL — ABNORMAL HIGH (ref 0.61–1.24)
GFR, Estimated: 37 mL/min — ABNORMAL LOW (ref >60)
Glucose, Bld: 115 mg/dL — ABNORMAL HIGH (ref 70–99)
Potassium: 4.5 mmol/L (ref 3.5–5.1)
Sodium: 139 mmol/L (ref 135–145)

## 2020-11-05 LAB — CBC
HCT: 42.6 % (ref 39.0–52.0)
Hemoglobin: 14.4 g/dL (ref 13.0–17.0)
MCH: 31.2 pg (ref 26.0–34.0)
MCHC: 33.8 g/dL (ref 30.0–36.0)
MCV: 92.2 fL (ref 80.0–100.0)
Platelets: 197 10*3/uL (ref 150–400)
RBC: 4.62 MIL/uL (ref 4.22–5.81)
RDW: 13.2 % (ref 11.5–15.5)
WBC: 10 10*3/uL (ref 4.0–10.5)
nRBC: 0 % (ref 0.0–0.2)

## 2020-11-05 LAB — ECHOCARDIOGRAM COMPLETE
AR max vel: 2.99 cm2
AV Area VTI: 3.19 cm2
AV Area mean vel: 3.19 cm2
AV Mean grad: 9.2 mmHg
AV Peak grad: 14.8 mmHg
Ao pk vel: 1.92 m/s
Area-P 1/2: 2.45 cm2
Height: 75 in
S' Lateral: 2.3 cm
Weight: 4027.2 oz

## 2020-11-05 LAB — MAGNESIUM: Magnesium: 2.4 mg/dL (ref 1.7–2.4)

## 2020-11-05 MED ORDER — CLOPIDOGREL BISULFATE 75 MG PO TABS: 75.0000 mg | ORAL_TABLET | Freq: Every day | ORAL | 1 refills | Status: AC

## 2020-11-05 MED ORDER — PERFLUTREN LIPID MICROSPHERE
1.0000 mL | INTRAVENOUS | Status: DC | PRN
Start: 2020-11-05 — End: 2020-11-05
  Administered 2020-11-05: 2 mL via INTRAVENOUS
  Filled 2020-11-05: qty 10

## 2020-11-05 NOTE — Progress Notes (Signed)
CARDIAC REHAB PHASE I   Returned to reinforce education at RNs request. Reinforced importance of site care, restrictions, and exercise guidelines. Pt denies further questions or concerns at this time. Echo at bedside.  [id number] Rufina Falco, RN BSN 11/05/2020 10:33 AM

## 2020-11-05 NOTE — Discharge Summary (Signed)
[name]  Discharge Summary    Patient ID: [name]. MRN: [id number]; DOB: 09-29-1933  Admit date: 11/04/2020 Discharge date: 11/05/2020  Primary Care Provider: Jacklynn Ganong, MD  Primary Cardiologist: Kirk Ruths, MD / Dr. Burt Knack & Dr. Roxy Manns (TAVR)  Discharge Diagnoses    Principal Problem:   S/P TAVR (transcatheter aortic valve replacement) Active Problems:   HYPERCHOLESTEROLEMIA   Essential hypertension   Coronary atherosclerosis   Cerebrovascular disease   TIA (transient ischemic attack)   Prostate cancer ([place])   PAF (paroxysmal atrial fibrillation) (HCC)   Lumbar stenosis with neurogenic claudication   Severe aortic stenosis   Thoracic ascending aortic aneurysm (HCC)   Allergies Allergies  Allergen Reactions  . Metronidazole Hives and Itching  . Procaine Other (See Comments)    Loss of consciousness Remarks that the caine injection for dental procedure - but reports that the anxiety might have made him pass out   . Statins Other (See Comments)    Leg pain    Diagnostic Studies/Procedures     TAVR OPERATIVE NOTE   Date of Procedure:                11/04/2020  Preoperative Diagnosis:      Severe Aortic Stenosis   Postoperative Diagnosis:    Same   Procedure:        Transcatheter Aortic Valve Replacement - Percutaneous  Transfemoral Approach             Edwards Sapien 3 THV (size 29 mm, model # 9600TFX, serial # [id number])              Co-Surgeons:                        Valentina Gu. Roxy Manns, MD and Sherren Mocha, MD  Anesthesiologist:                  Belinda Block and Annye Asa, MD  Echocardiographer:              Sanda Klein, MD  Pre-operative Echo Findings: ? Severe aortic stenosis ? Normal left ventricular systolic function  Post-operative Echo Findings: ? Trace paravalvular leak ? Normal left ventricular systolic function  _____________   Echo 11/05/20:  complete but pending formal read at the time of discharge    History of Present Illness     [name]. is a 87 y.o. male with a history of chronic back pain, CAD s/p CABG (2007) with post op afib (declines [place]), prostate cancer, CKD stage III, previous CVA, HTN, HLD, ascending aortic aneurysm and severe AS who presented to [facility] on 11/04/20 for planned TAVR.  Patient's cardiac history dates back to 2007 when he presented with symptomatic ischemic heart disease and underwent coronary artery bypass grafting x5.  Grafts placed at the time of surgery included left internal mammary artery to the distal left anterior descending coronary artery, sequential saphenous vein graft to the first diagonal and left circumflex, and sequential saphenous vein graft to the posterior descending and right posterior lateral branch.  He recovered uneventfully and has been followed intermittently ever since by Dr. Stanford Breed. Previous Holter monitor showed very brief episode of atrial fibrillation. Carotid duplex scans revealed no significant cerebrovascular disease. Nuclear stress test performed December 2020 revealed normal left ventricular function with no evidence for ischemia. Echocardiograms have documented the presence of normal left ventricular function with aortic  stenosis that has slowly progressed in severity.  Over the past 6 months or more the patient has developed progressive symptoms of exertional shortness of breath without any chest discomfort. Recent follow-up echocardiogram performed September 23, 2020 revealed normal left ventricular systolic function with severe aortic stenosis. Peak velocity across aortic valve measured as high as 4.2 m/s corresponding to mean transvalvular gradient estimated 36 mmHg and aortic valve area calculated 0.82 cm by VTI. The DVI was notably 0.26.  Left ventricular systolic function appeared normal with ejection fraction estimated 60 to 65%.  The patient was referred to the  multidisciplinary heart valve clinic and underwent diagnostic cardiac catheterization by Dr. Burt Knack on October 01, 2020.  Catheterization revealed severe native coronary artery disease but continued patency of all 5 bypass grafts placed at the time of the patient's previous coronary artery bypass surgery.  Right heart pressures were normal.    The patient has been evaluated by the multidisciplinary valve Bush and felt to have severe, symptomatic aortic stenosis and to be a suitable candidate for TAVR, which was set up for 11/04/2020.   Hospital Course     Consultants: none  Severe AS: s/p successful TAVR with a 29 mm Edwards Sapien 3 THV via the TF approach on 11/04/20. Post operative echo completed but pending formal read. Groin sites are stable. ECG with old first degree block and no high grade heart block. Continue Asprin and started on Plavix 75 mg daily. Plan for discharge home with close follow up in the office next week.  CAD: pre TAVR cath showed severe native coronary artery disease but continued patency of all 5 bypass grafts. Continue medical therapy.  TAA: 4.5 cm in diameter. Recommend semi-annual imaging followup by CTA or MRA.  PAF: noted remotely post operatively and on holter monitor. Maintaining sinus rhythm and has declined [place] in the past despite elevated Chadsvasc score.   CKD stage IIIb: creat baseline appears to be 1.5-1.9. Creat at baseline ~1.78 today.   HTN: BP well controlled. Resumed on home HCTZ _____________  Discharge Vitals Blood pressure 134/74, pulse 75, temperature 98.5 F (36.9 C), temperature source Oral, resp. rate 15, height 6\' 3"  (1.905 m), weight 114.2 kg, SpO2 95 %.  Filed Weights   11/04/20 0653 11/05/20 0340  Weight: 115.2 kg 114.2 kg    GEN: Well nourished, well developed, in no acute distress HEENT: normal Neck: no JVD or masses Cardiac: RRR; no murmurs, rubs, or gallops,no edema  Respiratory:  clear to auscultation bilaterally, normal work of  breathing GI: soft, nontender, nondistended, + BS MS: no deformity or atrophy Skin: warm and dry, no rash.  Groin sites clear without hematoma or ecchymosis  Neuro:  Alert and Oriented x 3, Strength and sensation are intact Psych: euthymic mood, full affect    Labs & Radiologic Studies    CBC Recent Labs    11/04/20 1234 11/05/20 0141  WBC  --  10.0  HGB 12.9* 14.4  HCT 38.0* 42.6  MCV  --  92.2  PLT  --  637   Basic Metabolic Panel Recent Labs    11/04/20 1234 11/05/20 0141  NA 141 139  K 4.1 4.5  CL 105 106  CO2  --  29  GLUCOSE 122* 115*  BUN 31* 26*  CREATININE 1.50* 1.78*  CALCIUM  --  8.6*  MG  --  2.4   Liver Function Tests No results for input(s): AST, ALT, ALKPHOS, BILITOT, PROT, ALBUMIN in the last 72 hours. No  results for input(s): LIPASE, AMYLASE in the last 72 hours. Cardiac Enzymes No results for input(s): CKTOTAL, CKMB, CKMBINDEX, TROPONINI in the last 72 hours. BNP Invalid input(s): POCBNP D-Dimer No results for input(s): DDIMER in the last 72 hours. Hemoglobin A1C No results for input(s): HGBA1C in the last 72 hours. Fasting Lipid Panel No results for input(s): CHOL, HDL, LDLCALC, TRIG, CHOLHDL, LDLDIRECT in the last 72 hours. Thyroid Function Tests No results for input(s): TSH, T4TOTAL, T3FREE, THYROIDAB in the last 72 hours.  Invalid input(s): FREET3 _____________  DG Chest 2 View  Result Date: 10/31/2020 CLINICAL DATA:  87 year old male with aortic stenosis, preoperative exam EXAM: CHEST - 2 VIEW COMPARISON:  Chest x-ray 05/05/2012, CT chest 10/10/2020 FINDINGS: Cardiomediastinal silhouette unchanged in size and contour. Surgical changes of median sternotomy and CABG. Coarsened interstitial markings. No pneumothorax or pleural effusion. Calcifications of the native coronary arteries. Aortic valve calcifications. Degenerative changes spine.  No acute displaced fracture IMPRESSION: Negative for acute cardiopulmonary disease Electronically  Signed   By: Corrie Mckusick D.O.   On: 10/31/2020 11:15   CT CORONARY MORPH W/CTA COR W/SCORE W/CA W/CM &/OR WO/CM  Addendum Date: 10/12/2020   ADDENDUM REPORT: 10/12/2020 17:28 CLINICAL DATA:  Aortic stenosis EXAM: Cardiac TAVR CT TECHNIQUE: The patient was scanned on a Siemens Force 277 slice scanner. A 120 kV retrospective scan was triggered in the descending thoracic aorta at 111 HU's. Gantry rotation speed was 270 msecs and collimation was .9 mm. No beta blockade or nitro were given. The 3D data set was reconstructed in 5% intervals of the R-R cycle. Systolic and diastolic phases were analyzed on a dedicated work station using MPR, MIP and VRT modes. The patient received 100 cc of contrast. FINDINGS: Aortic Valve: Moderately thickened tricuspid aortic valve with moderate calcification. The planimeter valve area is 3.18 Sq cm which is not consistent with severe aortic stenosis Number of leaflets: 3 LVOT calcification: Minimal Annular calcification: Mild Aortic Valve Calcium Score 1400. Aortic Annulus Measurements Major annulus diameter: 33 mm Minor annulus diameter: 22 mm Annular perimeter: 89 mm Annular area: 5.64 cm2 Aortic Root Measurements- Diastole Sinus of Valsalva perimeter: 112 mm Sinotubular Junction: 37 mm Ascending Thoracic Aorta: Mild aneurysmal dilatation of the ascending thoracic aorta: 45 mm. Aortic Arch: 31 mm; aortic atherosclerosis noted. Descending Thoracic Aorta: 34 mm; aortic atherosclerosis noted. Sinus of Valsalva Measurements: Right coronary cusp width: 33 mm Left-coronary cusp width: 35 mm Non- coronary cusp width: 34 mm Coronary Artery Height above Annulus: Left Main: 15 mm Right Coronary: 14 mm Coronary Arteries: Calcified native coronary arteries with bypass grafting noted. Coronary Artery Calcium score: Deferred in the setting of known CAD and prior bypass. Optimum Fluoroscopic Angle for Delivery: 3 Cusp-traditional View: LAO 18, CAU 5. Anterior View: RAO 0, Cau 25. Valves for  heart Bush consideration: 29 mm SAPIEN 3; 31 mm CoreValve system. IMPRESSION: 1. Aortic stenosis; CINE, calcium score, and planimetry less consistent with severe aortic stenosis. Findings pertinent to TAVR procedure are detailed above. 2. Coronary Arteries: Calcified native coronary arteries with bypass grafting noted. 3.  Moderate ascending aortic aneurysm noted: 45 mm. 4.  Aortic atherosclerosis noted. Mahesh  Chandrasekhar Electronically Signed   By: Rudean Haskell MD   On: 10/12/2020 17:28   Result Date: 10/12/2020 EXAM: OVER-READ INTERPRETATION  CT CHEST The following report is an over-read performed by radiologist Dr. Vinnie Langton of [facility], [place] on 10/10/2020. This over-read does not include interpretation of cardiac or coronary anatomy or pathology.  The coronary calcium score/coronary CTA interpretation by the cardiologist is attached. COMPARISON:  None. FINDINGS: Extracardiac findings will be described separately under dictation for contemporaneously obtained CTA chest, abdomen and pelvis. IMPRESSION: Please see separate dictation for contemporaneously obtained CTA chest, abdomen and pelvis dated 10/10/2020 for full description of relevant extracardiac findings. Electronically Signed: By: Vinnie Langton M.D. On: 10/10/2020 10:20   CT ANGIO CHEST AORTA W/CM & OR WO/CM  Result Date: 10/11/2020 CLINICAL DATA:  87 year old male with history of severe aortic stenosis. Preprocedural study prior to potential transcatheter aortic valve replacement (TAVR) procedure. EXAM: CT ANGIOGRAPHY CHEST, ABDOMEN AND PELVIS TECHNIQUE: Multidetector CT imaging through the chest, abdomen and pelvis was performed using the standard protocol during bolus administration of intravenous contrast. Multiplanar reconstructed images and MIPs were obtained and reviewed to evaluate the vascular anatomy. CONTRAST:  136mL OMNIPAQUE IOHEXOL 350 MG/ML SOLN COMPARISON:  Chest CTA 09/08/2020. CT the abdomen and  pelvis 09/17/2014. FINDINGS: CTA CHEST FINDINGS Cardiovascular: Heart size is normal. There is no significant pericardial fluid, thickening or pericardial calcification. There is aortic atherosclerosis, as well as atherosclerosis of the great vessels of the mediastinum and the coronary arteries, including calcified atherosclerotic plaque in the left main, left anterior descending, left circumflex and right coronary arteries. Status post median sternotomy for CABG including LIMA to the LAD. Severe thickening and calcification of the aortic valve. Mild aneurysmal dilatation of the ascending thoracic aorta (4.5 cm in diameter). Mediastinum/Lymph Nodes: No pathologically enlarged mediastinal or hilar lymph nodes. Esophagus is unremarkable in appearance. No axillary lymphadenopathy. Lungs/Pleura: No suspicious appearing pulmonary nodules or masses are noted. No acute consolidative airspace disease. No pleural effusions. Musculoskeletal/Soft Tissues: Median sternotomy wires. There are no aggressive appearing lytic or blastic lesions noted in the visualized portions of the skeleton. CTA ABDOMEN AND PELVIS FINDINGS Hepatobiliary: No suspicious cystic or solid hepatic lesions. No intra or extrahepatic biliary ductal dilatation. Gallbladder is normal in appearance. Pancreas: No pancreatic mass. No pancreatic ductal dilatation. No pancreatic or peripancreatic fluid collections or inflammatory changes. Spleen: Unremarkable. Adrenals/Urinary Tract: Multiple low-attenuation lesions in both kidneys compatible with simple cysts, largest of which is 6 cm in diameter extending exophytically from the lower pole of the left kidney. In the posterior aspect of the lower pole of the left kidney there is also a exophytic 4.2 cm intermediate attenuation lesion which is only slightly larger than remote prior study from 11/27/2014, most compatible with a proteinaceous/hemorrhagic cyst. In the lower pole collecting system of the right kidney  there is a 6 mm nonobstructive calculus. No hydroureteronephrosis. Urinary bladder is normal in appearance. Bilateral adrenal glands are normal in appearance. Stomach/Bowel: Normal appearance of the stomach. No pathologic dilatation of small bowel or colon. Several colonic diverticulae are noted, without surrounding inflammatory changes to suggest an acute diverticulitis at this time. The appendix is not confidently identified and may be surgically absent. Regardless, there are no inflammatory changes noted adjacent to the cecum to suggest the presence of an acute appendicitis at this time. Vascular/Lymphatic: Vascular findings and measurements pertinent to potential TAVR procedure, as detailed below. Aortic atherosclerosis, without evidence of aneurysm or dissection in the abdominal or pelvic vasculature. No lymphadenopathy noted in the abdomen or pelvis. Reproductive: Brachytherapy implants throughout the prostate gland. Seminal vesicles are unremarkable in appearance. Other: No significant volume of ascites.  No pneumoperitoneum. Musculoskeletal: Status post PLIF from L2-L5 with interbody grafts at L2-L3, L3-L4 and L4-L5. Status post left hip arthroplasty. There are no aggressive appearing lytic or blastic  lesions noted in the visualized portions of the skeleton. VASCULAR MEASUREMENTS PERTINENT TO TAVR: AORTA: Minimal Aortic Diameter-17 x 16 mm Severity of Aortic Calcification-severe RIGHT PELVIS: Right Common Iliac Artery - Minimal Diameter-12.3 x 10.1 mm Tortuosity-mild Calcification-moderate to severe Right External Iliac Artery - Minimal Diameter-9.4 x 8.6 mm Tortuosity-severe Calcification-moderate Right Common Femoral Artery - Minimal Diameter-9.6 x 9.3 mm Tortuosity-mild Calcification-moderate to severe LEFT PELVIS: Left Common Iliac Artery - Minimal Diameter-10.8 x 10.8 mm Tortuosity-mild Calcification-moderate to severe Left External Iliac Artery - Minimal Diameter-10.4 x 8.8 mm Tortuosity-severe  Calcification-mild-to-moderate Left Common Femoral Artery - Minimal Diameter-9.0 x 7.8 mm Tortuosity-mild Calcification-moderate to severe Review of the MIP images confirms the above findings. IMPRESSION: 1. Vascular findings and measurements pertinent to potential TAVR procedure, as detailed above. 2. Severe thickening calcification of the aortic valve, compatible with reported clinical history of severe aortic stenosis. 3. Aortic atherosclerosis, in addition to left main and 3 vessel coronary artery disease. Status post median sternotomy for CABG including LIMA to the LAD. 4. Mild aneurysmal dilatation of the ascending thoracic aorta (4.5 cm in diameter). Ascending thoracic aortic aneurysm. Recommend semi-annual imaging followup by CTA or MRA and referral to cardiothoracic surgery if not already obtained. This recommendation follows 2010 ACCF/AHA/AATS/ACR/ASA/SCA/SCAI/SIR/STS/SVM Guidelines for the Diagnosis and Management of Patients With Thoracic Aortic Disease. Circulation. 2010; 121: W413-K440. Aortic aneurysm NOS (ICD10-I71.9). 5. Additional incidental findings, as above. Electronically Signed   By: Vinnie Langton M.D.   On: 10/11/2020 05:39   VAS US CAROTID  Result Date: 10/11/2020 Carotid Arterial Duplex Study Risk Factors:     Hypertension, hyperlipidemia, coronary artery disease, prior                   CVA. Comparison Study: 08/21/18 previous Performing Technologist: Abram Sander RVS  Examination Guidelines: A complete evaluation includes B-mode imaging, spectral Doppler, color Doppler, and power Doppler as needed of all accessible portions of each vessel. Bilateral testing is considered an integral part of a complete examination. Limited examinations for reoccurring indications may be performed as noted.  Right Carotid Findings: +----------+--------+--------+--------+------------------+--------+           PSV cm/sEDV cm/sStenosisPlaque DescriptionComments  +----------+--------+--------+--------+------------------+--------+ CCA Prox  81      13              heterogenous               +----------+--------+--------+--------+------------------+--------+ CCA Distal46      14              heterogenous               +----------+--------+--------+--------+------------------+--------+ ICA Prox  55      22      1-39%   heterogenous               +----------+--------+--------+--------+------------------+--------+ ICA Distal91      38                                         +----------+--------+--------+--------+------------------+--------+ ECA       106     11                                         +----------+--------+--------+--------+------------------+--------+ +----------+--------+-------+--------+-------------------+           PSV cm/sEDV  cmsDescribeArm Pressure (mmHG) +----------+--------+-------+--------+-------------------+ Subclavian109                                        +----------+--------+-------+--------+-------------------+ +---------+--------+--+--------+-+---------+ VertebralPSV cm/s32EDV cm/s9Antegrade +---------+--------+--+--------+-+---------+  Left Carotid Findings: +----------+--------+--------+--------+------------------+--------+           PSV cm/sEDV cm/sStenosisPlaque DescriptionComments +----------+--------+--------+--------+------------------+--------+ CCA Prox  140     28              heterogenous               +----------+--------+--------+--------+------------------+--------+ CCA Distal41      16              heterogenous               +----------+--------+--------+--------+------------------+--------+ ICA Prox  47      20      1-39%   heterogenous               +----------+--------+--------+--------+------------------+--------+ ICA Distal76      38                                         +----------+--------+--------+--------+------------------+--------+  ECA       70      10                                         +----------+--------+--------+--------+------------------+--------+ +----------+--------+--------+--------+-------------------+           PSV cm/sEDV cm/sDescribeArm Pressure (mmHG) +----------+--------+--------+--------+-------------------+ [id number]                                          +----------+--------+--------+--------+-------------------+ +---------+--------+--+--------+--+---------+ VertebralPSV cm/s43EDV cm/s15Antegrade +---------+--------+--+--------+--+---------+   Summary: Right Carotid: Velocities in the right ICA are consistent with a 1-39% stenosis. Left Carotid: Velocities in the left ICA are consistent with a 1-39% stenosis. Vertebrals: Bilateral vertebral arteries demonstrate antegrade flow. *See table(s) above for measurements and observations.  Electronically signed by Harold Barban MD on 10/11/2020 at 4:19:24 PM.    Final    ECHOCARDIOGRAM LIMITED  Result Date: 11/04/2020    ECHOCARDIOGRAM LIMITED REPORT   Patient Name:   Oryan Winterton. Date of Exam: 11/04/2020 Medical Rec #:  [id number]            Height:       75.0 in Accession #:    [id number]           Weight:       254.0 lb Date of Birth:  Jul 26, 1934           BSA:          2.429 m Patient Age:    61 years             BP:           176/84 mmHg Patient Gender: M                    HR:           74 bpm. Exam Location:  Inpatient Procedure: Limited Echo, Cardiac Doppler and Color Doppler Indications:     Aortic stenosis  I35.0  History:         Patient has prior history of Echocardiogram examinations, most                  recent 09/23/2020. Stroke, Arrythmias:Bradycardia; Risk                  Factors:Hypertension and Dyslipidemia. History of cancer.                  Aortic Valve: 29 mm Sapien prosthetic, stented (TAVR) valve is                  present in the aortic position. Procedure Date: 11/04/2020.  Sonographer:     Darlina Sicilian RDCS  Referring Phys:  [place] Diagnosing Phys: Sanda Klein MD                   PRE-PROCEDURAL FINDINGS:                   Normal left ventricular systolic function. Estimated LVEF                  60-65%.                  There are no regional wall motion abnormalities.                  Severe calcific aortic stenosis. Trileaflet aortic valve.                  Peak aortic valve gradient 74 mm Hg, mean gradient 40 mm Hg.                  Acceleration time 137 ms.                  Dimensionless obstructive index 0.23, calculated aortic valve                  area is 0.8 cm.                  Trivial aortic insufficiency. Trivial mitral insufficiency.                  No pericardial effusion.                   POST-PROCEDURAL FINDINGS:                   Hyperdynamic left ventricular systolic function. Estimated LVEF                  75%.                  There are no regional wall motion abnormalities.                  Well-seated TAVR stent-valve.                  Peak aortic valve gradient 12 mm Hg, mean gradient 7 mm Hg.                  Acceleration time 67 ms.                  Dimensionless obstructive index 0.79, calculated aortic valve                  area is 2.7 cm.  There is a trivial anterior perivalvular leak.                  No mitral insufficiency.                  No pericardial effusion. IMPRESSIONS  1. There is severe calcifcation of the aortic valve. There is severe thickening of the aortic valve. Severe aortic valve stenosis. There is a 29 mm Sapien prosthetic (TAVR) valve present in the aortic position. Procedure Date: 11/04/2020.  2. The left ventricle has normal function. FINDINGS  Left Ventricle: The left ventricle has normal function. Aortic Valve: There is severe calcifcation of the aortic valve. There is severe thickening of the aortic valve. Severe aortic stenosis is present. Aortic valve mean gradient measures 39.2 mmHg. Aortic valve peak gradient measures 73.7  mmHg. Aortic valve area, by VTI measures 0.80 cm. There is a 29 mm Sapien prosthetic, stented (TAVR) valve present in the aortic position. Procedure Date: 11/04/2020. LEFT VENTRICLE PLAX 2D LVOT diam:     2.12 cm LV SV:         80 LV SV Index:   33 LVOT Area:     3.53 cm  AORTIC VALVE AV Area (Vmax):    0.79 cm AV Area (Vmean):   0.83 cm AV Area (VTI):     0.80 cm AV Vmax:           429.36 cm/s AV Vmean:          292.657 cm/s AV VTI:            1.000 m AV Peak Grad:      73.7 mmHg AV Mean Grad:      39.2 mmHg LVOT Vmax:         96.25 cm/s LVOT Vmean:        68.852 cm/s LVOT VTI:          0.228 m LVOT/AV VTI ratio: 0.23  SHUNTS Systemic VTI:  0.23 m Systemic Diam: 2.12 cm Sanda Klein MD Electronically signed by Sanda Klein MD Signature Date/Time: 11/04/2020/5:50:56 PM    Final    Structural Heart Procedure  Result Date: 11/04/2020 See surgical note for result.  CT Angio Abd/Pel w/ and/or w/o  Result Date: 10/11/2020 CLINICAL DATA:  87 year old male with history of severe aortic stenosis. Preprocedural study prior to potential transcatheter aortic valve replacement (TAVR) procedure. EXAM: CT ANGIOGRAPHY CHEST, ABDOMEN AND PELVIS TECHNIQUE: Multidetector CT imaging through the chest, abdomen and pelvis was performed using the standard protocol during bolus administration of intravenous contrast. Multiplanar reconstructed images and MIPs were obtained and reviewed to evaluate the vascular anatomy. CONTRAST:  185mL OMNIPAQUE IOHEXOL 350 MG/ML SOLN COMPARISON:  Chest CTA 09/08/2020. CT the abdomen and pelvis 09/17/2014. FINDINGS: CTA CHEST FINDINGS Cardiovascular: Heart size is normal. There is no significant pericardial fluid, thickening or pericardial calcification. There is aortic atherosclerosis, as well as atherosclerosis of the great vessels of the mediastinum and the coronary arteries, including calcified atherosclerotic plaque in the left main, left anterior descending, left circumflex and right  coronary arteries. Status post median sternotomy for CABG including LIMA to the LAD. Severe thickening and calcification of the aortic valve. Mild aneurysmal dilatation of the ascending thoracic aorta (4.5 cm in diameter). Mediastinum/Lymph Nodes: No pathologically enlarged mediastinal or hilar lymph nodes. Esophagus is unremarkable in appearance. No axillary lymphadenopathy. Lungs/Pleura: No suspicious appearing pulmonary nodules or masses are noted. No acute consolidative airspace disease. No pleural effusions. Musculoskeletal/Soft Tissues: Median sternotomy  wires. There are no aggressive appearing lytic or blastic lesions noted in the visualized portions of the skeleton. CTA ABDOMEN AND PELVIS FINDINGS Hepatobiliary: No suspicious cystic or solid hepatic lesions. No intra or extrahepatic biliary ductal dilatation. Gallbladder is normal in appearance. Pancreas: No pancreatic mass. No pancreatic ductal dilatation. No pancreatic or peripancreatic fluid collections or inflammatory changes. Spleen: Unremarkable. Adrenals/Urinary Tract: Multiple low-attenuation lesions in both kidneys compatible with simple cysts, largest of which is 6 cm in diameter extending exophytically from the lower pole of the left kidney. In the posterior aspect of the lower pole of the left kidney there is also a exophytic 4.2 cm intermediate attenuation lesion which is only slightly larger than remote prior study from 11/27/2014, most compatible with a proteinaceous/hemorrhagic cyst. In the lower pole collecting system of the right kidney there is a 6 mm nonobstructive calculus. No hydroureteronephrosis. Urinary bladder is normal in appearance. Bilateral adrenal glands are normal in appearance. Stomach/Bowel: Normal appearance of the stomach. No pathologic dilatation of small bowel or colon. Several colonic diverticulae are noted, without surrounding inflammatory changes to suggest an acute diverticulitis at this time. The appendix is not  confidently identified and may be surgically absent. Regardless, there are no inflammatory changes noted adjacent to the cecum to suggest the presence of an acute appendicitis at this time. Vascular/Lymphatic: Vascular findings and measurements pertinent to potential TAVR procedure, as detailed below. Aortic atherosclerosis, without evidence of aneurysm or dissection in the abdominal or pelvic vasculature. No lymphadenopathy noted in the abdomen or pelvis. Reproductive: Brachytherapy implants throughout the prostate gland. Seminal vesicles are unremarkable in appearance. Other: No significant volume of ascites.  No pneumoperitoneum. Musculoskeletal: Status post PLIF from L2-L5 with interbody grafts at L2-L3, L3-L4 and L4-L5. Status post left hip arthroplasty. There are no aggressive appearing lytic or blastic lesions noted in the visualized portions of the skeleton. VASCULAR MEASUREMENTS PERTINENT TO TAVR: AORTA: Minimal Aortic Diameter-17 x 16 mm Severity of Aortic Calcification-severe RIGHT PELVIS: Right Common Iliac Artery - Minimal Diameter-12.3 x 10.1 mm Tortuosity-mild Calcification-moderate to severe Right External Iliac Artery - Minimal Diameter-9.4 x 8.6 mm Tortuosity-severe Calcification-moderate Right Common Femoral Artery - Minimal Diameter-9.6 x 9.3 mm Tortuosity-mild Calcification-moderate to severe LEFT PELVIS: Left Common Iliac Artery - Minimal Diameter-10.8 x 10.8 mm Tortuosity-mild Calcification-moderate to severe Left External Iliac Artery - Minimal Diameter-10.4 x 8.8 mm Tortuosity-severe Calcification-mild-to-moderate Left Common Femoral Artery - Minimal Diameter-9.0 x 7.8 mm Tortuosity-mild Calcification-moderate to severe Review of the MIP images confirms the above findings. IMPRESSION: 1. Vascular findings and measurements pertinent to potential TAVR procedure, as detailed above. 2. Severe thickening calcification of the aortic valve, compatible with reported clinical history of severe aortic  stenosis. 3. Aortic atherosclerosis, in addition to left main and 3 vessel coronary artery disease. Status post median sternotomy for CABG including LIMA to the LAD. 4. Mild aneurysmal dilatation of the ascending thoracic aorta (4.5 cm in diameter). Ascending thoracic aortic aneurysm. Recommend semi-annual imaging followup by CTA or MRA and referral to cardiothoracic surgery if not already obtained. This recommendation follows 2010 ACCF/AHA/AATS/ACR/ASA/SCA/SCAI/SIR/STS/SVM Guidelines for the Diagnosis and Management of Patients With Thoracic Aortic Disease. Circulation. 2010; 121: A193-X902. Aortic aneurysm NOS (ICD10-I71.9). 5. Additional incidental findings, as above. Electronically Signed   By: Vinnie Langton M.D.   On: 10/11/2020 05:39   Disposition   Pt is being discharged home today in good condition.  Follow-up Plans & Appointments     Follow-up Information    Eileen Stanford, PA-C. Go on  11/12/2020.   Specialties: Cardiology, Radiology Why: @ 1:30pm, please arrive at least 10 minutes early. Contact information: [address] [place] [address] [phone]                Discharge Medications   Allergies as of 11/05/2020      Reactions   Metronidazole Hives, Itching   Procaine Other (See Comments)   Loss of consciousness Remarks that the caine injection for dental procedure - but reports that the anxiety might have made him pass out    Statins Other (See Comments)   Leg pain      Medication List    STOP taking these medications   ibuprofen 200 MG tablet Commonly known as: ADVIL     TAKE these medications   aspirin EC 81 MG tablet Take 81 mg by mouth in the morning. Swallow whole.   clopidogrel 75 MG tablet Commonly known as: PLAVIX Take 1 tablet (75 mg total) by mouth daily with breakfast.   cycloSPORINE 0.05 % ophthalmic emulsion Commonly known as: RESTASIS Place 1 drop into both eyes 2 (two) times daily as needed (dry/itchy  eyes).   hydrochlorothiazide 25 MG tablet Commonly known as: HYDRODIURIL Take 25 mg by mouth in the morning.   tamsulosin 0.4 MG Caps capsule Commonly known as: FLOMAX Take 0.8 mg by mouth at bedtime.   Vitamin D-3 125 MCG (5000 UT) Tabs Take 5,000 Units by mouth in the morning.         Outstanding Labs/Studies   none  Duration of Discharge Encounter   Greater than 30 minutes including physician time.  SignedAngelena Form, PA-C 11/05/2020, 8:03 AM [phone]

## 2020-11-05 NOTE — Progress Notes (Signed)
CARDIAC REHAB PHASE I   Offered to walk with pt. Pt states several walks since procedure without assistive device or difficulty. Pt states breathing is much better than prior to procedure. Grateful for the work he has gotten done. Reviewed site care, restrictions, and exercise guidelines. Pt declines CRP II at this time. Waiting on echo, then hopeful for d/c.   [phone] Rufina Falco, RN BSN 11/05/2020 8:52 AM

## 2020-11-05 NOTE — Progress Notes (Signed)
Mobility Specialist: Progress Note   11/05/20 1155  Mobility  Activity Ambulated in hall  Level of Assistance Contact guard assist, steadying assist  Assistive Device Front wheel walker  Distance Ambulated (ft) 470 ft  Mobility Response Tolerated well  Mobility performed by Mobility specialist  $Mobility charge 1 Mobility   Pre-Mobility: 87 HR Post-Mobility: 76 HR, 158/75 BP, 96% SpO2  Pt experienced LOB upon standing and while walking to the door so opted to use RW today. Pt otherwise asx. Pt sitting EOB waiting for family member to arrive for discharge.   [facility] [name] Mobility Specialist Mobility Specialist Phone: [phone]

## 2020-11-05 NOTE — Plan of Care (Signed)

## 2020-11-06 ENCOUNTER — Telehealth: Payer: Self-pay | Admitting: Physician Assistant

## 2020-11-06 NOTE — Telephone Encounter (Signed)
  [place] VALVE TEAM   Patient contacted regarding discharge from [facility] on 4/6  Unable to reach pt. Left VM    Angelena Form PA-C  [facility]

## 2020-11-07 NOTE — Telephone Encounter (Signed)
  [place] VALVE TEAM   Patient contacted regarding discharge from [facility] on 4/6  Patient understands to follow up with provider Nell Range on 4/13 at [place].  Patient understands discharge instructions? yes Patient understands medications and regimen? yes Patient understands to bring all medications to this visit? Yes  Spoke to sister who said he is doing great  Angelena Form PA-C  [facility]

## 2020-11-08 NOTE — Progress Notes (Signed)
HEART AND [place]                                     Cardiology Office Note:    Date:  11/12/2020   ID:  Sergio Foley., DOB 1933/12/12, MRN [id number]  PCP:  Sergio Ganong, MD  [facility] Cardiologist:  Sergio Ruths, MD / Dr. Burt Knack & Dr. Roxy Manns (TAVR) [facility] Electrophysiologist:  None   Referring MD: Sergio Ganong, MD   [facility] s/p TAVR  History of Present Illness:    Sergio Luckow. is a 87 y.o. male with a hx of chronic back pain, CAD s/p CABG (2007) with post op afib (declines [place]), prostate cancer, CKD stage III, previous CVA, HTN, HLD, ascending aortic aneurysm and severe AS s/p TAVR (11/04/20) who presents to clinic for follow up.  Patient's cardiac history dates back to 2007 when he presented with symptomatic ischemic heart disease and underwent coronary artery bypass grafting x5. Grafts placed at the time of surgery included left internal mammary artery to the distal left anterior descending coronary artery, sequential saphenous vein graft to the first diagonal and left circumflex, and sequential saphenous vein graft to the posterior descending and right posterior lateral branch. He recovered uneventfully and has been followed intermittently ever since by Dr. Stanford Breed. Previous Holter monitor showed very brief episode of atrial fibrillation. Carotid duplex scans revealed no significant cerebrovascular disease. Nuclear stress test performed December 2020 revealed normal left ventricular function with no evidence for ischemia. Echocardiograms have documented the presence of normal left ventricular function with aortic stenosis that has slowly progressed in severity. Over the past 6 months or more the patient has developed progressive symptoms of exertional shortness of breath without any chest discomfort.Recent follow-up echocardiogram performed September 23, 2020 revealed normal left ventricular systolic function with  severe aortic stenosis. Peak velocity across aortic valve measured as high as 4.2 m/s corresponding to mean transvalvular gradient estimated 36 mmHg and aortic valve area calculated 0.82 cm by VTI. The DVI was notably 0.26. Left ventricular systolic function appeared normal with ejection fraction estimated 60 to 65%. The patient was referred to the multidisciplinary heart valve clinic and underwent diagnostic cardiac catheterization by Dr. Burt Knack on October 01, 2020. Catheterization revealed severe native coronary artery disease but continued patency of all 5 bypass grafts placed at the time of the patient's previous coronary artery bypass surgery. Right heart pressures were normal.   He was evaluated by the multidisciplinary valve team and and underwent successful TAVR with a 29 mm Edwards Sapien 3 THV via the TF approach on 11/04/20. Post operative echo showed EF 70%, normally functioning TAVR with a mean gradient of 9.2 mmHg and no PVL. He was discharged on POD1 on aspirin and plavix. He was told to stop taking his ibuprofen.   Today he presents to clinic for follow up. No CP or SOB. No LE edema, orthopnea or PND. No dizziness or syncope. No blood in stool or urine. No palpitations. Mostly limited by back pain. Cannot tolerate the pain without ibuprofen. Has not tried Tylelnol.   Past Medical History:  Diagnosis Date  . Arthritis    OA- lumbar  . BPH (benign prostatic hypertrophy)   . BRADYCARDIA   . CAD    s/p CABG in 2007 x 5  . Carotid stenosis    0-39% bilaterally  .  Complication of anesthesia    ? procaine allergy, pt. remarks that he had passing out surrounding Prostate biopsy   . History of kidney stones 2010   at [facility] treated with cystoscopy  . HYPERCHOLESTEROLEMIA    statin intolerant  . HYPERTENSION   . Long term (current) use of anticoagulants   . NEPHROLITHIASIS   . Neuromuscular disorder (HCC)    numbness in fingers bilaterally  . PAF (paroxysmal atrial fibrillation)  ([place])    declines [place]  . Prostate cancer ([place]) 2014   seed implant  . S/P TAVR (transcatheter aortic valve replacement) 11/04/2020   s/p TAVR with a 29 mm Edwards Sapien 3 via the TF approach by Dr. Burt Knack and Dr. Roxy Manns   . Severe aortic stenosis   . Skin cancer    sqaumous- top of head  . Stroke [facility])    mini stroke   . Thoracic ascending aortic aneurysm (HCC)    4.4 cm 08/21/18    Past Surgical History:  Procedure Laterality Date  . ANTERIOR LAT LUMBAR FUSION Left 07/23/2019   Procedure: Lumbar Two-Three, Lumbar Three-Four, Lumbar Four-Five XLIF, pedicle screw fixation, mazor;  Surgeon: Kristeen Miss, MD;  Location: [place];  Service: Neurosurgery;  Laterality: Left;  Lumbar Two-Three, Lumbar Three-Four, Lumbar Four-Five XLIF, pedicle screw fixation, mazor  . APPENDECTOMY  1999  . APPLICATION OF ROBOTIC ASSISTANCE FOR SPINAL PROCEDURE N/A 07/23/2019   Procedure: APPLICATION OF ROBOTIC ASSISTANCE FOR SPINAL PROCEDURE;  Surgeon: Kristeen Miss, MD;  Location: [place];  Service: Neurosurgery;  Laterality: N/A;  . cardiac arrest following CABG N/A   . COLONOSCOPY    . CORONARY ARTERY BYPASS GRAFT  2007   x 5  . EYE SURGERY     cataracts removed - done at [place], IOL in both eyes    . HERNIA REPAIR Left 1996   inguinal  . JOINT REPLACEMENT     L hip & L shoulder   . LITHOTRIPSY  2013  . LUMBAR PERCUTANEOUS PEDICLE SCREW 3 LEVEL N/A 07/23/2019   Procedure: LUMBAR PERCUTANEOUS PEDICLE SCREW 3 LEVEL;  Surgeon: Kristeen Miss, MD;  Location: [place];  Service: Neurosurgery;  Laterality: N/A;  . RADIOACTIVE SEED IMPLANT N/A 01/06/2015   Procedure: RADIOACTIVE SEED IMPLANT/BRACHYTHERAPY IMPLANT;  Surgeon: Hollice Espy, MD;  Location: [facility];  Service: Urology;  Laterality: N/A;  . RIGHT HEART CATH AND CORONARY/GRAFT ANGIOGRAPHY N/A 10/01/2020   Procedure: RIGHT HEART CATH AND CORONARY/GRAFT ANGIOGRAPHY;  Surgeon: Sherren Mocha, MD;  Location: [place] CV LAB;  Service: Cardiovascular;   Laterality: N/A;  . TEE WITHOUT CARDIOVERSION N/A 11/04/2020   Procedure: TRANSESOPHAGEAL ECHOCARDIOGRAM (TEE);  Surgeon: Sherren Mocha, MD;  Location: [place] CV LAB;  Service: Open Heart Surgery;  Laterality: N/A;  . TOTAL HIP ARTHROPLASTY Left   . TRANSCATHETER AORTIC VALVE REPLACEMENT, TRANSFEMORAL N/A 11/04/2020   Procedure: TRANSCATHETER AORTIC VALVE REPLACEMENT, TRANSFEMORAL;  Surgeon: Sherren Mocha, MD;  Location: [place] CV LAB;  Service: Open Heart Surgery;  Laterality: N/A;    Current Medications: Current Meds  Medication Sig  . aspirin EC 81 MG tablet Take 81 mg by mouth in the morning. Swallow whole.  . Cholecalciferol (VITAMIN D-3) 125 MCG (5000 UT) TABS Take 5,000 Units by mouth in the morning.  . clopidogrel (PLAVIX) 75 MG tablet Take 1 tablet (75 mg total) by mouth daily with breakfast.  . cycloSPORINE (RESTASIS) 0.05 % ophthalmic emulsion Place 1 drop into both eyes 2 (two) times daily as needed (dry/itchy eyes).  [place] Kitchen  hydrochlorothiazide (HYDRODIURIL) 25 MG tablet Take 25 mg by mouth in the morning.  . tamsulosin (FLOMAX) 0.4 MG CAPS capsule Take 0.8 mg by mouth at bedtime.    Current Facility-Administered Medications for the 11/12/20 encounter (Office Visit) with Eileen Stanford, PA-C  Medication  . sodium chloride flush (NS) 0.9 % injection 3 mL     Allergies:   Metronidazole, Procaine, and Statins   Social History   Socioeconomic History  . Marital status: Divorced    Spouse name: Not on file  . Number of children: Not on file  . Years of education: Not on file  . Highest education level: Not on file  Occupational History  . Not on file  Tobacco Use  . Smoking status: Former Smoker    Packs/day: 1.00    Types: Cigarettes    Quit date: 11/30/1968    Years since quitting: 51.9  . Smokeless tobacco: Former Systems developer    Types: [place] date: 12/26/1987  Vaping Use  . Vaping Use: Never used  Substance and Sexual Activity  . Alcohol use: No     Alcohol/week: 0.0 standard drinks  . Drug use: No  . Sexual activity: Not Currently  Other Topics Concern  . Not on file  Social History Narrative  . Not on file   Social Determinants of Health   Financial Resource Strain: Not on file  Food Insecurity: Not on file  Transportation Needs: Not on file  Physical Activity: Not on file  Stress: Not on file  Social Connections: Not on file     Family History: The patient's family history includes Heart attack in his father; Heart disease in his mother; Heart failure in his mother.  ROS:   Please see the history of present illness.    All other systems reviewed and are negative.  EKGs/Labs/Other Studies Reviewed:    The following studies were reviewed today:  TAVR OPERATIVE NOTE   Date of Procedure:11/04/2020  Preoperative Diagnosis:Severe Aortic Stenosis   Postoperative Diagnosis:Same   Procedure:   Transcatheter Aortic Valve Replacement - Percutaneous Transfemoral Approach Edwards Sapien 3 THV (size 80mm, model # 9600TFX, serial # [id number])  Co-Surgeons:Clarence H. Roxy Manns, MD and Sherren Mocha, MD  Anesthesiologist:Charlene Nyoka Cowden and Annye Asa, MD  Echocardiographer:Mihai Croitoru, MD  Pre-operative Echo Findings: ? Severe aortic stenosis ? Normalleft ventricular systolic function  Post-operative Echo Findings: ? Traceparavalvular leak ? Normalleft ventricular systolic function  _____________   Echo 11/05/20:  IMPRESSIONS  1. Left ventricular ejection fraction, by estimation, is 70 to 75%. The  left ventricle has hyperdynamic function. The left ventricle has no  regional wall motion abnormalities. There is moderate concentric left  ventricular hypertrophy. Left ventricular  diastolic parameters are consistent with Grade I diastolic dysfunction  (impaired relaxation).  2. Right  ventricular systolic function is normal. The right ventricular  size is normal. There is normal pulmonary artery systolic pressure. The  estimated right ventricular systolic pressure is 24.2 mmHg.  3. Left atrial size was mildly dilated.  4. The mitral valve is normal in structure. No evidence of mitral valve  regurgitation.  5. The aortic valve has been repaired/replaced. Aortic valve  regurgitation is not visualized. There is a valve present in the aortic  position. Echo findings are consistent with normal structure and function  of the aortic valve prosthesis. Aortic valve  mean gradient measures 9.2 mmHg. Aortic valve Vmax measures 1.92 m/s.  Aortic valve acceleration time measures 60 msec.  6. Aortic  dilatation noted. There is moderate dilatation of the ascending  aorta, measuring 44 mm.    EKG:  EKG is  ordered today.  The ekg ordered today demonstrates sinus with 1st deg av block and baseline artifact  Recent Labs: 10/31/2020: ALT 19 11/05/2020: BUN 26; Creatinine, Ser 1.78; Hemoglobin 14.4; Magnesium 2.4; Platelets 197; Potassium 4.5; Sodium 139  Recent Lipid Panel    Component Value Date/Time   CHOL 238 (H) 09/23/2020 0846   TRIG 100 09/23/2020 0846   HDL 39 (L) 09/23/2020 0846   CHOLHDL 6.1 (H) 09/23/2020 0846   CHOLHDL 6 01/11/2012 0902   VLDL 39.6 01/11/2012 0902   LDLCALC 181 (H) 09/23/2020 0846   LDLDIRECT 157.0 01/11/2012 0902     Risk Assessment/Calculations:    CHA2DS2-VASc Score = 5  This indicates a 7.2% annual risk of stroke. The patient's score is based upon: CHF History: Yes HTN History: Yes Diabetes History: No Stroke History: No Vascular Disease History: Yes Age Score: 2 Gender Score: 0      Physical Exam:    VS:  BP 128/70   Pulse 77   Ht 6\' 3"  (1.905 m)   Wt 250 lb (113.4 kg)   SpO2 97%   BMI 31.25 kg/m     Wt Readings from Last 3 Encounters:  11/12/20 250 lb (113.4 kg)  11/05/20 251 lb 11.2 oz (114.2 kg)  10/31/20 254 lb 14.4  oz (115.6 kg)     GEN:  Well nourished, well developed in no acute distress HEENT: Normal NECK: No JVD; No carotid bruits LYMPHATICS: No lymphadenopathy CARDIAC: RRR, no murmurs, rubs, gallops RESPIRATORY:  Clear to auscultation without rales, wheezing or rhonchi  ABDOMEN: Soft, non-tender, non-distended MUSCULOSKELETAL:  No edema; No deformity  SKIN: Warm and dry. Groin sites clear without hematoma or ecchymosis  NEUROLOGIC:  Alert and oriented x 3 PSYCHIATRIC:  Normal affect   ASSESSMENT:    1. S/P TAVR (transcatheter aortic valve replacement)   2. Coronary artery disease involving native coronary artery of native heart without angina pectoris   3. Ascending aortic aneurysm (HCC)   4. Paroxysmal atrial fibrillation ([place])   5. Renal insufficiency   6. Essential hypertension    PLAN:    In order of problems listed above:  Severe AS s/p TAVR: doing great 1 week out from TAVR with an improvement in his symptoms. ECG with no HAVB. Groins sites are clear. SBE prophylaxis discussed; the patient is edentulous and does not go to the dentist. He is on aspirin and plavix but is concerned about being on plavix as it does not allow him to take ibuprofen for his chronic back pain. He will try tylenol and let us know if that will help him control his pain. If he has to go back on ibuprofen, we can stop plavix.   CAD: pre TAVR cath showed severe native coronary artery disease but continued patency of all 5 bypass grafts. Continue medical therapy.  TAA: 4.5 cm in diameter. Recommend semi-annual imaging followup by CTA or MRA.  PAF: noted remotely post operatively and on holter monitor. Maintaining sinus rhythm and has declined [place] in the past despite elevated Chadsvasc score.   CKD stage IIIb: creat baseline appears to be 1.5-1.9. Creat at baseline ~1.78 today. Discussed how ibuprofen use is also damaging to the kidneys.   HTN: Bp well controlled today.     Medication Adjustments/Labs  and Tests Ordered: Current medicines are reviewed at length with the patient today.  Concerns  regarding medicines are outlined above.  Orders Placed This Encounter  Procedures  . EKG 12-Lead   No orders of the defined types were placed in this encounter.   Patient Instructions  Medication Instructions:  Your physician recommends that you continue on your current medications as directed. Please refer to the Current Medication list given to you today.  *If you need a refill on your cardiac medications before your next appointment, please call your pharmacy*   Lab Work: None ordered   Testing/Procedures: None ordered   Follow-Up: At [facility], you and your health needs are our priority.  As part of our continuing mission to provide you with exceptional heart care, we have created designated Provider Care Teams.  These Care Teams include your primary Cardiologist (physician) and Advanced Practice Providers (APPs -  Physician Assistants and Nurse Practitioners) who all work together to provide you with the care you need, when you need it.  Your next appointment:    12/10/2020  The format for your next appointment:   In Person  Provider:   Nell Range, PA-C    Thank you for choosing [facility]!!    Other Instructions   If you continue having trouble with your back pain call  [phone]       Signed, Angelena Form, PA-C  11/12/2020 2:33 PM    [place]

## 2020-11-12 ENCOUNTER — Other Ambulatory Visit: Payer: Self-pay

## 2020-11-12 ENCOUNTER — Ambulatory Visit: Payer: Medicare Other | Admitting: Physician Assistant

## 2020-11-12 ENCOUNTER — Encounter: Payer: Self-pay | Admitting: Physician Assistant

## 2020-11-12 VITALS — BP 128/70 | HR 77 | Ht 75.0 in | Wt 250.0 lb

## 2020-11-12 DIAGNOSIS — I480 Paroxysmal atrial fibrillation: Secondary | ICD-10-CM | POA: Diagnosis not present

## 2020-11-12 DIAGNOSIS — Z952 Presence of prosthetic heart valve: Secondary | ICD-10-CM

## 2020-11-12 DIAGNOSIS — I712 Thoracic aortic aneurysm, without rupture: Secondary | ICD-10-CM | POA: Diagnosis not present

## 2020-11-12 DIAGNOSIS — I2510 Atherosclerotic heart disease of native coronary artery without angina pectoris: Secondary | ICD-10-CM

## 2020-11-12 DIAGNOSIS — I7121 Aneurysm of the ascending aorta, without rupture: Secondary | ICD-10-CM

## 2020-11-12 DIAGNOSIS — N289 Disorder of kidney and ureter, unspecified: Secondary | ICD-10-CM

## 2020-11-12 DIAGNOSIS — I10 Essential (primary) hypertension: Secondary | ICD-10-CM

## 2020-11-12 NOTE — Patient Instructions (Signed)
Medication Instructions:  Your physician recommends that you continue on your current medications as directed. Please refer to the Current Medication list given to you today.  *If you need a refill on your cardiac medications before your next appointment, please call your pharmacy*   Lab Work: None ordered   Testing/Procedures: None ordered   Follow-Up: At [facility], you and your health needs are our priority.  As part of our continuing mission to provide you with exceptional heart care, we have created designated Provider Care Teams.  These Care Teams include your primary Cardiologist (physician) and Advanced Practice Providers (APPs -  Physician Assistants and Nurse Practitioners) who all work together to provide you with the care you need, when you need it.  Your next appointment:    12/10/2020  The format for your next appointment:   In Person  Provider:   Nell Range, PA-C    Thank you for choosing [facility]!!    Other Instructions   If you continue having trouble with your back pain call  [phone]

## 2020-12-10 ENCOUNTER — Other Ambulatory Visit (HOSPITAL_COMMUNITY): Payer: Medicare Other

## 2020-12-10 ENCOUNTER — Ambulatory Visit: Payer: Medicare Other | Admitting: Physician Assistant

## 2020-12-10 ENCOUNTER — Telehealth (HOSPITAL_COMMUNITY): Payer: Self-pay | Admitting: Physician Assistant

## 2020-12-10 NOTE — Telephone Encounter (Signed)
Patient cancelled echocardiogram per reason below:  12/10/2020 11:48 AM [id number], [name]  Cancel Rsn: Patient (Pt requested to cancel apt due to concerns over billing and does not want to see a PA/lwb)

## 2020-12-11 NOTE — Telephone Encounter (Signed)
Pt is upset with [facility] and billing issues he has been experiencing. He refuses to come in and see me (doesn't want to pay for a visit and see a PA-C). I will see if he is willing to see Dr. Stanford Breed and do an echo for his 1 month TAVR follow up. Left him a VM today and we will follow up next week if he doesn't call back

## 2020-12-26 ENCOUNTER — Ambulatory Visit: Payer: Medicare Other | Admitting: Cardiology

## 2020-12-31 ENCOUNTER — Other Ambulatory Visit: Payer: Self-pay

## 2020-12-31 DIAGNOSIS — I350 Nonrheumatic aortic (valve) stenosis: Secondary | ICD-10-CM

## 2020-12-31 DIAGNOSIS — Z952 Presence of prosthetic heart valve: Secondary | ICD-10-CM

## 2021-01-01 ENCOUNTER — Telehealth: Payer: Self-pay

## 2021-01-01 NOTE — Telephone Encounter (Signed)
Pt had cancelled 1 month TAVR follow-up due to billing issues with [facility].  I left the pt a voicemail just to check in and see how he is doing after TAVR and also to discuss having an Echocardiogram performed.  I have currently booked the pt for an Echo in the [place] office on 6/23. Will await return call from pt.

## 2021-01-22 ENCOUNTER — Other Ambulatory Visit: Payer: Medicare Other

## 2021-01-23 NOTE — Telephone Encounter (Signed)
Pt was notified of 6/23 Echo appointment through My Chart.  Pt called the office on 6/20 and cancelled Echo because he did not want to have any further testing at this time. The pt has no pending appointments scheduled with cardiology.

## 2021-09-21 ENCOUNTER — Other Ambulatory Visit: Payer: Self-pay | Admitting: Physician Assistant

## 2021-09-21 DIAGNOSIS — Z952 Presence of prosthetic heart valve: Secondary | ICD-10-CM

## 2021-09-29 ENCOUNTER — Telehealth: Payer: Self-pay | Admitting: Physician Assistant

## 2021-09-29 ENCOUNTER — Telehealth (HOSPITAL_COMMUNITY): Payer: Self-pay | Admitting: Physician Assistant

## 2021-09-29 NOTE — Telephone Encounter (Signed)
Patient cancelled echocardiogram for reason below:  09/29/2021 10:11 AM [id number], Moody Bruins  Cancel Rsn: Patient (Pt refuses to have any care through [facility]. He is transferring all care to [facility].)   Order will be removed form the Active echo WQ.

## 2021-09-29 NOTE — Telephone Encounter (Signed)
°  HEART AND VASCULAR CENTER   MULTIDISCIPLINARY HEART VALVE TEAM   He is due to see [place] for his 1 year follow up echo and OV s/p TAVR. He missed his 1 month appt and echo due to being upset with [facility]. He still has not been seen by [place] or with any other cardiologist.   He plans to transfer all his care to Dr. Marland KitchenA" (does not have a full name) at [facility]. He has an apt on 3/7. He is very disgruntled with [facility] saying he has paid all his bills and it still say "BAD DEBT" in his chart which he saw when his apt screen was printed.    He was able to complete a KCCQ on the phone. He described NYHA class II-III symptoms.   [facility] Cardiomyopathy Questionnaire  KCCQ-12 09/29/2021 10/13/2020  1 a. Ability to shower/bathe Not at all limited Slightly limited  1 b. Ability to walk 1 block Moderately limited Slightly limited  1 c. Ability to hurry/jog Other, Did not do Other, Did not do  2. Edema feet/ankles/legs Never over the past 2 weeks Never over the past 2 weeks  3. Limited by fatigue At least once a day At least once a day  4. Limited by dyspnea Several times a day At least once a day  5. Sitting up / on 3+ pillows Never over the past 2 weeks Every night  6. Limited enjoyment of life - Slightly limited  7. Rest of life w/ symptoms Not at all satisfied Mostly dissatisfied  8 a. Participation in hobbies Moderately limited Slightly limited  8 b. Participation in chores Moderately limited Moderately limited  8 c. Visiting family/friends Moderately limited Did not limit at all    Angelena Form PA-C  [facility]

## 2021-09-30 ENCOUNTER — Telehealth: Payer: Self-pay | Admitting: *Deleted

## 2021-09-30 NOTE — Telephone Encounter (Signed)
Left message for patient, he is due to have a follow up CTA of the chest to size the thoracic aneurysm. Patient is to call me back if he would like for he to schedule that testing for him or make sure he gets one with his new providers. ?

## 2021-10-16 ENCOUNTER — Other Ambulatory Visit (HOSPITAL_COMMUNITY): Payer: Medicare Other

## 2021-10-16 ENCOUNTER — Ambulatory Visit: Payer: Medicare Other

## 2021-10-16 ENCOUNTER — Encounter (HOSPITAL_COMMUNITY): Payer: Self-pay
